# Patient Record
Sex: Male | Born: 1955 | Race: White | Hispanic: No | Marital: Married | Smoking: Never smoker
Health system: Southern US, Community
[De-identification: ages and names within clinical notes are randomized; demographics above are authoritative.]

## PROBLEM LIST (undated history)

## (undated) DIAGNOSIS — C189 Malignant neoplasm of colon, unspecified: Secondary | ICD-10-CM

## (undated) DIAGNOSIS — Z7409 Other reduced mobility: Secondary | ICD-10-CM

## (undated) DIAGNOSIS — G4733 Obstructive sleep apnea (adult) (pediatric): Secondary | ICD-10-CM

## (undated) DIAGNOSIS — G629 Polyneuropathy, unspecified: Secondary | ICD-10-CM

## (undated) DIAGNOSIS — I350 Nonrheumatic aortic (valve) stenosis: Secondary | ICD-10-CM

## (undated) DIAGNOSIS — G5600 Carpal tunnel syndrome, unspecified upper limb: Secondary | ICD-10-CM

## (undated) DIAGNOSIS — E119 Type 2 diabetes mellitus without complications: Secondary | ICD-10-CM

## (undated) DIAGNOSIS — E669 Obesity, unspecified: Secondary | ICD-10-CM

## (undated) DIAGNOSIS — I10 Essential (primary) hypertension: Secondary | ICD-10-CM

## (undated) DIAGNOSIS — Z889 Allergy status to unspecified drugs, medicaments and biological substances status: Secondary | ICD-10-CM

## (undated) HISTORY — PX: FOOT SURGERY: SHX648

## (undated) HISTORY — DX: Obstructive sleep apnea (adult) (pediatric): G47.33

## (undated) HISTORY — DX: Polyneuropathy, unspecified: G62.9

## (undated) HISTORY — DX: Carpal tunnel syndrome, unspecified upper limb: G56.00

## (undated) HISTORY — DX: Obesity, unspecified: E66.9

## (undated) HISTORY — DX: Type 2 diabetes mellitus without complications: E11.9

## (undated) HISTORY — DX: Nonrheumatic aortic (valve) stenosis: I35.0

## (undated) HISTORY — DX: Malignant neoplasm of colon, unspecified: C18.9

---

## 1997-10-24 ENCOUNTER — Ambulatory Visit: Admission: RE | Admit: 1997-10-24 | Discharge: 1997-10-24 | Payer: Self-pay | Admitting: Family Medicine

## 1998-01-30 ENCOUNTER — Ambulatory Visit: Admission: RE | Admit: 1998-01-30 | Discharge: 1998-01-30 | Payer: Self-pay | Admitting: Pulmonary Disease

## 2003-08-03 ENCOUNTER — Encounter
Admission: RE | Admit: 2003-08-03 | Discharge: 2003-10-13 | Payer: Self-pay | Admitting: Physical Medicine & Rehabilitation

## 2003-10-13 ENCOUNTER — Encounter
Admission: RE | Admit: 2003-10-13 | Discharge: 2003-12-30 | Payer: Self-pay | Admitting: Physical Medicine & Rehabilitation

## 2003-10-30 ENCOUNTER — Ambulatory Visit: Payer: Self-pay | Admitting: Physical Medicine & Rehabilitation

## 2003-12-30 ENCOUNTER — Encounter
Admission: RE | Admit: 2003-12-30 | Discharge: 2004-03-29 | Payer: Self-pay | Admitting: Physical Medicine & Rehabilitation

## 2004-03-17 ENCOUNTER — Ambulatory Visit: Payer: Self-pay | Admitting: Physical Medicine & Rehabilitation

## 2004-04-15 ENCOUNTER — Encounter
Admission: RE | Admit: 2004-04-15 | Discharge: 2004-07-14 | Payer: Self-pay | Admitting: Physical Medicine & Rehabilitation

## 2004-05-24 ENCOUNTER — Ambulatory Visit: Payer: Self-pay | Admitting: Physical Medicine & Rehabilitation

## 2004-07-15 ENCOUNTER — Encounter
Admission: RE | Admit: 2004-07-15 | Discharge: 2004-10-13 | Payer: Self-pay | Admitting: Physical Medicine & Rehabilitation

## 2004-08-23 ENCOUNTER — Ambulatory Visit: Payer: Self-pay | Admitting: Physical Medicine & Rehabilitation

## 2004-10-19 ENCOUNTER — Ambulatory Visit: Payer: Self-pay | Admitting: Physical Medicine & Rehabilitation

## 2004-10-19 ENCOUNTER — Encounter
Admission: RE | Admit: 2004-10-19 | Discharge: 2005-01-17 | Payer: Self-pay | Admitting: Physical Medicine & Rehabilitation

## 2004-11-21 ENCOUNTER — Ambulatory Visit: Payer: Self-pay | Admitting: Physical Medicine & Rehabilitation

## 2004-12-26 ENCOUNTER — Ambulatory Visit: Payer: Self-pay | Admitting: Physical Medicine & Rehabilitation

## 2005-02-09 ENCOUNTER — Ambulatory Visit: Payer: Self-pay | Admitting: Physical Medicine & Rehabilitation

## 2005-02-09 ENCOUNTER — Encounter
Admission: RE | Admit: 2005-02-09 | Discharge: 2005-05-10 | Payer: Self-pay | Admitting: Physical Medicine & Rehabilitation

## 2005-04-28 ENCOUNTER — Ambulatory Visit: Payer: Self-pay | Admitting: Physical Medicine & Rehabilitation

## 2005-06-09 ENCOUNTER — Encounter
Admission: RE | Admit: 2005-06-09 | Discharge: 2005-09-07 | Payer: Self-pay | Admitting: Physical Medicine & Rehabilitation

## 2005-06-09 ENCOUNTER — Ambulatory Visit: Payer: Self-pay | Admitting: Physical Medicine & Rehabilitation

## 2005-07-10 ENCOUNTER — Ambulatory Visit: Payer: Self-pay | Admitting: Physical Medicine & Rehabilitation

## 2005-08-08 ENCOUNTER — Ambulatory Visit: Payer: Self-pay | Admitting: Physical Medicine & Rehabilitation

## 2005-10-02 ENCOUNTER — Encounter
Admission: RE | Admit: 2005-10-02 | Discharge: 2005-12-31 | Payer: Self-pay | Admitting: Physical Medicine & Rehabilitation

## 2005-10-03 ENCOUNTER — Ambulatory Visit: Payer: Self-pay | Admitting: Physical Medicine & Rehabilitation

## 2005-11-28 ENCOUNTER — Ambulatory Visit: Payer: Self-pay | Admitting: Physical Medicine & Rehabilitation

## 2006-02-07 ENCOUNTER — Ambulatory Visit: Payer: Self-pay | Admitting: Physical Medicine & Rehabilitation

## 2006-02-07 ENCOUNTER — Encounter
Admission: RE | Admit: 2006-02-07 | Discharge: 2006-05-08 | Payer: Self-pay | Admitting: Physical Medicine & Rehabilitation

## 2006-05-07 ENCOUNTER — Ambulatory Visit: Payer: Self-pay | Admitting: Physical Medicine & Rehabilitation

## 2006-05-07 ENCOUNTER — Encounter
Admission: RE | Admit: 2006-05-07 | Discharge: 2006-08-05 | Payer: Self-pay | Admitting: Physical Medicine & Rehabilitation

## 2006-06-12 ENCOUNTER — Encounter
Admission: RE | Admit: 2006-06-12 | Discharge: 2006-09-10 | Payer: Self-pay | Admitting: Physical Medicine & Rehabilitation

## 2006-07-04 ENCOUNTER — Ambulatory Visit: Payer: Self-pay | Admitting: Physical Medicine & Rehabilitation

## 2006-08-03 ENCOUNTER — Encounter
Admission: RE | Admit: 2006-08-03 | Discharge: 2006-11-01 | Payer: Self-pay | Admitting: Physical Medicine & Rehabilitation

## 2006-09-07 ENCOUNTER — Ambulatory Visit: Payer: Self-pay | Admitting: Physical Medicine & Rehabilitation

## 2006-11-06 ENCOUNTER — Ambulatory Visit: Payer: Self-pay | Admitting: Physical Medicine & Rehabilitation

## 2006-11-06 ENCOUNTER — Encounter
Admission: RE | Admit: 2006-11-06 | Discharge: 2007-02-04 | Payer: Self-pay | Admitting: Physical Medicine & Rehabilitation

## 2006-12-28 ENCOUNTER — Ambulatory Visit: Payer: Self-pay | Admitting: Physical Medicine & Rehabilitation

## 2007-02-01 ENCOUNTER — Encounter
Admission: RE | Admit: 2007-02-01 | Discharge: 2007-05-02 | Payer: Self-pay | Admitting: Physical Medicine & Rehabilitation

## 2007-02-01 ENCOUNTER — Ambulatory Visit: Payer: Self-pay | Admitting: Physical Medicine & Rehabilitation

## 2007-02-27 ENCOUNTER — Ambulatory Visit: Payer: Self-pay | Admitting: Physical Medicine & Rehabilitation

## 2007-05-02 ENCOUNTER — Encounter
Admission: RE | Admit: 2007-05-02 | Discharge: 2007-07-31 | Payer: Self-pay | Admitting: Physical Medicine & Rehabilitation

## 2007-05-07 ENCOUNTER — Ambulatory Visit: Payer: Self-pay | Admitting: Physical Medicine & Rehabilitation

## 2007-06-04 ENCOUNTER — Ambulatory Visit: Payer: Self-pay | Admitting: Physical Medicine & Rehabilitation

## 2007-07-03 ENCOUNTER — Ambulatory Visit: Payer: Self-pay | Admitting: Physical Medicine & Rehabilitation

## 2007-08-02 ENCOUNTER — Encounter
Admission: RE | Admit: 2007-08-02 | Discharge: 2007-10-02 | Payer: Self-pay | Admitting: Physical Medicine & Rehabilitation

## 2007-08-05 ENCOUNTER — Ambulatory Visit: Payer: Self-pay | Admitting: Physical Medicine & Rehabilitation

## 2007-09-03 ENCOUNTER — Ambulatory Visit: Payer: Self-pay | Admitting: Physical Medicine & Rehabilitation

## 2007-10-02 ENCOUNTER — Ambulatory Visit: Payer: Self-pay | Admitting: Physical Medicine & Rehabilitation

## 2007-10-31 ENCOUNTER — Encounter
Admission: RE | Admit: 2007-10-31 | Discharge: 2008-01-29 | Payer: Self-pay | Admitting: Physical Medicine & Rehabilitation

## 2007-11-01 ENCOUNTER — Ambulatory Visit: Payer: Self-pay | Admitting: Physical Medicine & Rehabilitation

## 2007-11-29 ENCOUNTER — Ambulatory Visit: Payer: Self-pay | Admitting: Physical Medicine & Rehabilitation

## 2007-12-27 ENCOUNTER — Ambulatory Visit: Payer: Self-pay | Admitting: Physical Medicine & Rehabilitation

## 2008-01-01 ENCOUNTER — Ambulatory Visit (HOSPITAL_COMMUNITY): Admission: RE | Admit: 2008-01-01 | Discharge: 2008-01-01 | Payer: Self-pay | Admitting: Gastroenterology

## 2008-01-01 ENCOUNTER — Encounter (INDEPENDENT_AMBULATORY_CARE_PROVIDER_SITE_OTHER): Payer: Self-pay | Admitting: Gastroenterology

## 2008-01-24 ENCOUNTER — Encounter
Admission: RE | Admit: 2008-01-24 | Discharge: 2008-01-27 | Payer: Self-pay | Admitting: Physical Medicine & Rehabilitation

## 2008-01-27 ENCOUNTER — Ambulatory Visit: Payer: Self-pay | Admitting: Physical Medicine & Rehabilitation

## 2008-03-16 DIAGNOSIS — C189 Malignant neoplasm of colon, unspecified: Secondary | ICD-10-CM

## 2008-03-16 HISTORY — PX: COLON SURGERY: SHX602

## 2008-03-16 HISTORY — DX: Malignant neoplasm of colon, unspecified: C18.9

## 2008-04-16 ENCOUNTER — Encounter
Admission: RE | Admit: 2008-04-16 | Discharge: 2008-04-16 | Payer: Self-pay | Admitting: Physical Medicine & Rehabilitation

## 2008-07-01 ENCOUNTER — Encounter
Admission: RE | Admit: 2008-07-01 | Discharge: 2008-09-29 | Payer: Self-pay | Admitting: Physical Medicine & Rehabilitation

## 2008-07-01 ENCOUNTER — Ambulatory Visit: Payer: Self-pay | Admitting: Physical Medicine & Rehabilitation

## 2008-07-31 ENCOUNTER — Ambulatory Visit: Payer: Self-pay | Admitting: Physical Medicine & Rehabilitation

## 2008-08-26 ENCOUNTER — Ambulatory Visit: Payer: Self-pay | Admitting: Physical Medicine & Rehabilitation

## 2008-09-23 ENCOUNTER — Encounter
Admission: RE | Admit: 2008-09-23 | Discharge: 2008-12-22 | Payer: Self-pay | Admitting: Physical Medicine & Rehabilitation

## 2008-09-25 ENCOUNTER — Ambulatory Visit: Payer: Self-pay | Admitting: Physical Medicine & Rehabilitation

## 2008-10-23 ENCOUNTER — Ambulatory Visit: Payer: Self-pay | Admitting: Physical Medicine & Rehabilitation

## 2008-11-20 ENCOUNTER — Ambulatory Visit: Payer: Self-pay | Admitting: Physical Medicine & Rehabilitation

## 2008-12-15 ENCOUNTER — Ambulatory Visit: Payer: Self-pay | Admitting: Physical Medicine & Rehabilitation

## 2009-01-18 ENCOUNTER — Encounter
Admission: RE | Admit: 2009-01-18 | Discharge: 2009-04-18 | Payer: Self-pay | Admitting: Physical Medicine & Rehabilitation

## 2009-01-19 ENCOUNTER — Ambulatory Visit: Payer: Self-pay | Admitting: Physical Medicine & Rehabilitation

## 2009-02-18 ENCOUNTER — Ambulatory Visit: Payer: Self-pay | Admitting: Physical Medicine & Rehabilitation

## 2009-03-19 ENCOUNTER — Ambulatory Visit: Payer: Self-pay | Admitting: Physical Medicine & Rehabilitation

## 2009-04-15 ENCOUNTER — Ambulatory Visit: Payer: Self-pay | Admitting: Physical Medicine & Rehabilitation

## 2009-04-22 ENCOUNTER — Encounter: Payer: Self-pay | Admitting: Pulmonary Disease

## 2009-05-06 ENCOUNTER — Encounter
Admission: RE | Admit: 2009-05-06 | Discharge: 2009-08-04 | Payer: Self-pay | Admitting: Physical Medicine & Rehabilitation

## 2009-05-13 ENCOUNTER — Ambulatory Visit: Payer: Self-pay | Admitting: Physical Medicine & Rehabilitation

## 2009-06-18 ENCOUNTER — Ambulatory Visit: Payer: Self-pay | Admitting: Physical Medicine & Rehabilitation

## 2009-07-08 ENCOUNTER — Ambulatory Visit (HOSPITAL_COMMUNITY): Admission: RE | Admit: 2009-07-08 | Discharge: 2009-07-08 | Payer: Self-pay | Admitting: Gastroenterology

## 2009-07-15 ENCOUNTER — Ambulatory Visit: Payer: Self-pay | Admitting: Physical Medicine & Rehabilitation

## 2009-08-20 ENCOUNTER — Encounter
Admission: RE | Admit: 2009-08-20 | Discharge: 2009-11-18 | Payer: Self-pay | Admitting: Physical Medicine & Rehabilitation

## 2009-08-26 ENCOUNTER — Ambulatory Visit: Payer: Self-pay | Admitting: Physical Medicine & Rehabilitation

## 2009-10-01 ENCOUNTER — Ambulatory Visit: Payer: Self-pay | Admitting: Physical Medicine & Rehabilitation

## 2009-11-19 ENCOUNTER — Encounter
Admission: RE | Admit: 2009-11-19 | Discharge: 2010-01-12 | Payer: Self-pay | Source: Home / Self Care | Attending: Physical Medicine & Rehabilitation | Admitting: Physical Medicine & Rehabilitation

## 2009-11-25 ENCOUNTER — Ambulatory Visit: Payer: Self-pay | Admitting: Physical Medicine & Rehabilitation

## 2010-01-17 ENCOUNTER — Encounter
Admission: RE | Admit: 2010-01-17 | Discharge: 2010-02-15 | Payer: Self-pay | Source: Home / Self Care | Attending: Physical Medicine & Rehabilitation | Admitting: Physical Medicine & Rehabilitation

## 2010-01-19 ENCOUNTER — Ambulatory Visit
Admission: RE | Admit: 2010-01-19 | Discharge: 2010-01-19 | Payer: Self-pay | Source: Home / Self Care | Attending: Physical Medicine & Rehabilitation | Admitting: Physical Medicine & Rehabilitation

## 2010-02-17 NOTE — Letter (Signed)
Summary: CMN request for CPAP Supplies/[facility]  CMN request for CPAP Supplies/[facility]   Imported By: Sherian Rein 05/17/2009 15:08:24  _____________________________________________________________________  External Attachment:    Type:   Image     Comment:   External Document

## 2010-03-24 ENCOUNTER — Encounter: Payer: Medicare FFS | Attending: Physical Medicine & Rehabilitation

## 2010-03-24 ENCOUNTER — Ambulatory Visit (HOSPITAL_BASED_OUTPATIENT_CLINIC_OR_DEPARTMENT_OTHER): Payer: Medicare FFS

## 2010-03-24 DIAGNOSIS — M47817 Spondylosis without myelopathy or radiculopathy, lumbosacral region: Secondary | ICD-10-CM

## 2010-03-24 DIAGNOSIS — M1710 Unilateral primary osteoarthritis, unspecified knee: Secondary | ICD-10-CM | POA: Insufficient documentation

## 2010-03-24 DIAGNOSIS — M545 Low back pain, unspecified: Secondary | ICD-10-CM | POA: Insufficient documentation

## 2010-03-24 DIAGNOSIS — M25569 Pain in unspecified knee: Secondary | ICD-10-CM | POA: Insufficient documentation

## 2010-03-24 DIAGNOSIS — F3289 Other specified depressive episodes: Secondary | ICD-10-CM | POA: Insufficient documentation

## 2010-03-24 DIAGNOSIS — R29898 Other symptoms and signs involving the musculoskeletal system: Secondary | ICD-10-CM | POA: Insufficient documentation

## 2010-03-24 DIAGNOSIS — F329 Major depressive disorder, single episode, unspecified: Secondary | ICD-10-CM | POA: Insufficient documentation

## 2010-03-24 DIAGNOSIS — R209 Unspecified disturbances of skin sensation: Secondary | ICD-10-CM | POA: Insufficient documentation

## 2010-03-24 DIAGNOSIS — E669 Obesity, unspecified: Secondary | ICD-10-CM

## 2010-03-24 DIAGNOSIS — K5900 Constipation, unspecified: Secondary | ICD-10-CM | POA: Insufficient documentation

## 2010-05-24 ENCOUNTER — Encounter: Payer: Medicare FFS | Attending: Physical Medicine & Rehabilitation | Admitting: Physical Medicine & Rehabilitation

## 2010-05-24 DIAGNOSIS — F329 Major depressive disorder, single episode, unspecified: Secondary | ICD-10-CM

## 2010-05-24 DIAGNOSIS — E669 Obesity, unspecified: Secondary | ICD-10-CM

## 2010-05-24 DIAGNOSIS — M545 Low back pain, unspecified: Secondary | ICD-10-CM | POA: Insufficient documentation

## 2010-05-24 DIAGNOSIS — M1710 Unilateral primary osteoarthritis, unspecified knee: Secondary | ICD-10-CM

## 2010-05-24 DIAGNOSIS — F3289 Other specified depressive episodes: Secondary | ICD-10-CM | POA: Insufficient documentation

## 2010-05-24 DIAGNOSIS — M47817 Spondylosis without myelopathy or radiculopathy, lumbosacral region: Secondary | ICD-10-CM

## 2010-05-24 DIAGNOSIS — M25569 Pain in unspecified knee: Secondary | ICD-10-CM | POA: Insufficient documentation

## 2010-05-25 NOTE — Assessment & Plan Note (Signed)
Brian Richmond is back regarding his bilateral knee and low back pain.  He complains of worsening pain in the knees over the last several months. We tried injections for his knees in the past and had some initial success but the more recent injections failed to give him any relief. He is having more pain when he bears weight transfers.  Pain is usually at the knees and radiating into the shins.  He has some low back pain still as well but does not notice much of a problem there.  He reports his sleep is fair at this point.  REVIEW OF SYSTEMS:  Notable for the above.  He is having some issues with his bladder control and Vesicare he was started on recently has helped that.  His mood is much improved.  SOCIAL HISTORY:  Unchanged.  Living with his wife and foster children.  PHYSICAL EXAMINATION:  VITAL SIGNS:  Blood pressure is 149/59, pulse 66, respiratory rate is 16, and he is saturating 98% on room air. GENERAL:  The patient remains morbidly obese and is IN his power wheelchair today. MUSCULOSKELETAL:  He has crepitus in both knees with range of motion passively.  He had pain with valgus and varus stretches as well as rotation at the knees. HEART:  Regular. CHEST:  Clear. ABDOMEN:  Soft and nontender.  ASSESSMENT: 1. Chronic low back pain. 2. Osteoarthritis of the knees. 3. Morbid obesity. 4. Depression.  PLAN: 1. Continue with Celexa which was refilled today. 2. We will increase his MS Contin to 45 mg q.12 h ---- #180 of the 15     mg tablets were written. 3. I will continue with his hydrocodone for breakthrough pain at     previous dosing (7.5/500 one q.8 h p.r.n. #90). 4. The patient did mention to me today also that he was diagnosed with     carpal tunnel syndrome as well as peripheral neuropathy.  It sounds     as if his carpal tunnel syndrome is severe and he will need surgery     on his wrists. 5. My nurse practitioner will see the patient back in about 1 month's      time.     Ranelle Oyster, M.D. Electronically Signed    ZTS/MedQ D:  05/24/2010 13:17:50  T:  05/25/2010 01:09:38  Job #:  [id number]

## 2010-05-31 NOTE — Assessment & Plan Note (Signed)
HISTORY:  Brian Richmond is back regarding his chronic back and knee pain.  He  notes no real changes with his Avinza and Vicodin today.  He rates his  pain at a 5-6/10.  He comes in with his power chair today, stating that  he finally got a lift attached to his car so that he can now use the  chair and take it with him more frequently.  He states that pain  interferes with his general activity, his relation with others and  normally at a moderate to severe level.  The pain is usually worse with  walking and standing.  He has tried the Hytrin we prescribed in January  and it has helped with his bladder, but he began to have worsening  constipation, so he came off of it.  He is interested in trying  something different.   REVIEW OF SYSTEMS:  Notable for the above.  A full review is in the  written health and history section of the chart.   SOCIAL HISTORY:  The patient is married.  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   PHYSICAL EXAMINATION:  VITAL SIGNS:  Blood pressure 165/76, pulse 80,  respirations 24, saturation 98% on room air.  Weight is unchanged.  GENERAL:  The patient is pleasant, alert and oriented.  He sits in his  wheelchair comfortably.  I did not have him stand or range his back  today per se.  EXTREMITIES/NEUROLOGIC:  His knee has remained painful with resistive  flexion and extension.  His strength is 5/5.  Sensory exam is normal.  CHEST:  Clear.  ABDOMEN:  Soft, nontender.   ASSESSMENT:  1. Multifactorial low back pain.  2. Bilateral osteoarthritis of the knees.  3. Morbid obesity.  4. Urinary hesitancy.   PLAN:  1. Refilled Avinza and Vicodin today.  2. Flomax drops, 0.4 mg q.h.s.  3. I will see him back in one month.  4. Nurse Clinic followup in three months' time.      Ranelle Oyster, M.D.  Electronically Signed     ZTS/MedQ  D:  05/07/2007 11:29:20  T:  05/07/2007 12:06:51  Job #:  [id number]

## 2010-05-31 NOTE — Assessment & Plan Note (Signed)
Brian Richmond is back regarding his chronic back and knee pain.  Not a lot  change since I last saw him.  He is stating that he is using occasional  Tylenol [id number] mg in addition to his Vicodin for his breakthrough  pain.  He had not lost any weight.  He uses a power chair for a longer  distances.  He can minimally move without the walker.  He states that he  can walk about a minute without having to stop.  Flomax has not helped  his urinary hesitancy significantly.  He rates his pain as 6/10;  describes as burning and aching.  The pain interferes with his general  activity, relations with others, enjoyment of life from moderate to  severe level.   REVIEW OF SYSTEMS:  Notable for some numbness as well as constipation  which is controlled with his regular Dulcolax.  Other pertinent  positives above and full 14-point review is in the written health and  history section.   SOCIAL HISTORY:  No significant change.   PHYSICAL EXAMINATION:  Blood pressure 135/61, pulse is 73, respiratory  rate 18, sating 96% on room air.  The patient remains obese.  He has  limited lumbar movement.  Knees are painful with the resisted flexion-  extension.  He has hard time weightbearing.  His sensory exam is really  without any focal findings today.  Chest is clear.  Abdomen, soft,  nontender.   ASSESSMENT:  1. Multifactorial low back pain.  2. Bilateral osteoarthritis of the knees.  3. Morbid obesity.  4. Urinary hesitancy.   PLAN:  1. Refill of Avinza 60 mg daily and Vicodin 7.5 one q.8 hours p.r.n.  2. Recommend urological followup, as bladder is still a problem.  3. The patient sees me in 3 months and Nurse Clinic in 1 month.      Ranelle Oyster, M.D.  Electronically Signed     ZTS/MedQ  D:  08/05/2007 13:11:41  T:  08/06/2007 16:10:96  Job #:  [id number]

## 2010-05-31 NOTE — Assessment & Plan Note (Signed)
Brian Richmond is back regarding his chronic knee pain.  He has had a lot of  change with his knees since I last saw him.  He is doing all right with  the change over to MS Contin in place of Avinza.  His biggest news is  recent workup, which has revealed some colon cancer.  He is in need of  resection/partial colectomy apparently; however, Anesthesia by his  account is hesitant to put him under for the surgery.  Apparently, he is  going to [facility] for an opinion regarding surgery there.  He is keeping  up with his bowel somewhat on his regimen of Dulcolax tabs and diet;  however, he needs to be a bit more consistent with that overall.   The patient rates his pain a 6-7/10 and describes it interfering with  his general activity, relations with others, enjoyment of life on a  severe level.   REVIEW OF SYSTEMS:  Notable for numbness, constipation, occasional  problems with appetite.  Full review is in the written health and  history section.  Other pertinent positives are above.   SOCIAL HISTORY:  The patient is married and doing well from a home  standpoint.   PHYSICAL EXAMINATION:  VITAL SIGNS:  Blood pressure is 145/52, pulse 78,  respiratory rate 20, sating 97% on room air.  GENERAL:  The patient is pleasant, alert and oriented x3.  He is in his  power wheelchair again today.  No new changes are noted in the knees.  His motor and sensory exam are unchanged.  HEART:  Regular.  CHEST:  Clear.  ABDOMEN:  Soft and nontender.  He remains morbidly overweight.   ASSESSMENT:  1. Multifactorial low back pain.  2. Osteoarthritis, bilateral knees.  3. Morbid obesity.  4. Apparent colon cancer.   PLAN:  1. Continue with MS Contin 30 mg q.12 h.  Stressed again the      importance of regular bowel regimen, and we discussed the options      today.  2. Vicodin 7.5/750 one q.8 h. p.r.n. #90.  3. I will see the patient back in 3 months and 1 month with nursing.      Ranelle Oyster, M.D.  Electronically Signed     ZTS/MedQ  D:  01/27/2008 17:15:34  T:  01/28/2008 07:27:57  Job #:  [id number]

## 2010-05-31 NOTE — Assessment & Plan Note (Signed)
Brian Richmond is back regarding his chronic back and knee pain.  Pain has been  about the same as it has been in intensity level.  He does rate it 6/10  today, which is lower than his previous 8/10 estimation in April.  He  described it as constant, aching, and at times burning.  He has been  active with the summer camp once again and I think that helps distract  him from his pain.  He has been working on his diet and eating better.  He states that at his last check 3 weeks ago at the nutrition center, he  lost 9 pounds.  He is excited about his progress.  He has cut out  sugared drinks and is eating more vegetables.   On review of systems, the patient reports occasional bladder control  problems and constipation.  Other pertinent positives are listed above  and a full review is in the health and history section of the chart.   SOCIAL HISTORY:  He is living with his wife and has two foster children.  He also helps take care of his grandchildren.   PHYSICAL EXAMINATION:  Blood pressure is 160/74, pulse is 96,  respiratory rate 18.  He is saturating 94% on room air.  The patient remains obese.  He does not appear to have lost noticeable  weight.  His ambulation appears to be a bit improved with better  transfers and weight shift.  He stands with a flexed posture and is  dependent upon the walker for balance.  Strength is generally 5/5.  Sensory exam is intact.  Heart is regular.  Chest is clear.   ASSESSMENT:  1. Multifactorial low back pain.  2. Bilateral osteoarthritis of the knees.  3. Morbid obesity.   PLAN:  1. I am proud that Brian Richmond seems to be somewhat committed now to his      diet and weight loss attempts.  Hopefully, he is successful and can      stick with these measures for some time.  2. Refilled Avinza and Vicodin today.  3. Gave patient samples of Celebrex.  He will seek assistance through      the drug assistance program.  4. I will see patient back in 3 months' time with  nurse clinic follow-      up in 1 month.      Ranelle Oyster, M.D.  Electronically Signed     ZTS/MedQ  D:  08/06/2006 12:28:46  T:  08/06/2006 13:46:16  Job #:  [id number]

## 2010-05-31 NOTE — Assessment & Plan Note (Signed)
REASON FOR VISIT:  Brian Richmond is back regarding his chronic knee and back  pain.  Not much has changed since I last saw him.  He states that he has  lost about 10 pounds.  He has tried to work on his diet, cut out soft  drinks and some other things, but has not made any dramatic changes.  He  has been a bit busier with family life and home management and states  that occasionally increases his pain.  His pain right now is a 7/10.  The pain is most prominent in his back and knees.  He uses his walker  for ambulation and balance.   REVIEW OF SYSTEMS:  The patient reports occasional bladder control  problem and drip problems with dribbling and sometimes difficulty with  flow when he does go to the bathroom.  The other issues are noted above.  Full reviews are in the written health and history section of the chart.   SOCIAL HISTORY:  The patient is married and continues to help out with  family management and home keeping.   PHYSICAL EXAMINATION:  VITAL SIGNS:  Blood pressure 165/75, pulse 77,  respirations 18, saturations 99% on room air.  GENERAL:  The patient is pleasant, alert and oriented x3.  He remains  morbidly obese.  He states his weight is approximately 500 pounds.  He  stands with a flexed posture and still has difficulty standing fully  erect.  He uses his walker for unloading.  Strength is generally 5/5.  Sensory exam is normal.  HEART:  Regular.  CHEST:  Clear.  NEUROLOGIC:  Cognitively, he is appropriate.   ASSESSMENT:  1. Multifactorial low back pain.  2. Bilateral osteoarthritis of the knees.  3. Morbid obesity.  4. Occasional urinary incontinence and decrease flow.   PLAN:  1. Refilled Avinza and Vicodin today, #30 and #90 respectively.  2. Recommended some over-the-counter remedies to supplement ibuprofen      and replace ibuprofen including Naproxen, Omega-3 fatty acids and      glucosamine.  3. He really needs to continue working on diet and exercise as  possible.  4. Discussed potential use of Flomax for urinary issues.  Certainly,      his weight is a factor here and I think the fact that he waits to      go to the bathroom because of leg pain, obviously is another reason      he needs to make an effort to go more regularly.  5. I will see him back in 3 months with nurse clinic in 1 month.      Ranelle Oyster, M.D.  Electronically Signed     ZTS/MedQ  D:  11/07/2006 11:59:08  T:  11/08/2006 08:11:21  Job #:  [id number]

## 2010-05-31 NOTE — Assessment & Plan Note (Signed)
HISTORY OF PRESENT ILLNESS:  Brian Richmond is back regarding his chronic knee  and back pain.  He generally remains stable on his current regimen of  Avinza and Vicodin.  He rates his pain as 6 out 10 today.  He has not  had much success with weight loss efforts.  He has not made any  significant changes in his diet.  He has more pain usually when he tries  to exercise and, thus, ultimately inhibits his efforts for aerobic  activity.   REVIEW OF SYSTEMS:  The patient reports constipation.  He has occasional  flow problems with his bladder with some spasm, perhaps, after he voids  as well.  Other pertinent positives are listed above on full review and  the written history section.   SOCIAL HISTORY:  The patient remains married and is a foster parent.   PHYSICAL EXAMINATION:  VITAL SIGNS:  Blood pressure is 154/77.  Pulse is  77.  Respiratory rate 18.  He is satting 96% on room air.  GENERAL APPEARANCE:  The patient is pleasant, alert and oriented x3.  He  remains morbidly obese.  He continues the exam with a flexed posture and  uses his walker to unload his weight somewhat.  Strength is generally is  5/5.  Sensory exam is normal in both lower extremities.  HEART:  Regular.  CHEST:  Clear.  ABDOMEN:  Soft and nontender.   ASSESSMENT:  1. Multifactorial low back pain.  2. Bilateral osteoarthritis of the knees.  3. Morbid obesity.  4. Occasional urinary incontinence and decreased flow.   PLAN:  1. Refill the Avinza and Vicodin today.  The patient is wanting three      month prescriptions, which we can help him with.  2. Try Hytrin 1 mg nightly for flow and blood pressure.  A      prescription was written for 1 mg nightly.  3. We will see him back in three months with a nurse clinic followup      in one month's time.      Ranelle Oyster, M.D.  Electronically Signed     ZTS/MedQ  D:  02/04/2007 11:19:24  T:  02/04/2007 11:54:25  Job #:  [id number]

## 2010-05-31 NOTE — Assessment & Plan Note (Signed)
Brian Richmond is back regarding chronic back and knee pain.  Knee pain is  generally at its baseline of 5-6/10.  He did have his colon resection in  March and is recovering.  He had some wound complications and still is  receiving wound care for delayed closure.  Pain interferes with general  activity, relations with others, enjoyment of life on a moderate-to-  severe level still.   REVIEW OF SYSTEMS:  Notable for bladder control problems, numbness,  urine retention, and constipation.  Now on MiraLax for his bowels.  Still has issues with constipation, however.  Full 14 point review is in  written health and history section.  Other pertinent positives are  above.   SOCIAL HISTORY:  The patient is married and living alone.  He is active  in summer camps once again.   PHYSICAL EXAMINATION:  VITAL SIGNS:  Blood pressure is 134/58, pulse is  73, respiratory rate 18, and he is sating 96% on room air.  NEUROLOGIC:  The patient is pleasant, alert, and oriented x3.  He is in  his power wheelchair.  I did not examine his abdominal wound.  Knees are  unchanged.  Back was difficult to assess today.  Motor and sensory exam  are stable.  HEART:  Regular.  CHEST:  Clear.  ABDOMEN:  Soft and nontender.  Remains morbidly obese.   ASSESSMENT:  1. Multifactorial low back pain.  2. Osteoarthritis of the knees.  3. Morbid obesity.  4. Colon cancer, status post resection with excellent prognosis for      recovery as lymph nodes were negative.   PLAN:  1. We will continue MS Contin 30 mg q.12 h., #60 and Vicodin 7.5, #90,      one q.8 h. p.r.n.  2. I discussed adding essentially fiber to his diet and adding fiber      supplements in addition to his MiraLax to improve his bowel      regimen.  Certainly since he has had been colon surgery, he will      need to be more aggressive than ever with his bowel regimen.  3. We will see him back in 3 months with me and 1 month with nursing.      Ranelle Oyster, M.D.  Electronically Signed     ZTS/MedQ  D:  07/01/2008 09:57:50  T:  07/01/2008 23:52:44  Job #:  [id number]

## 2010-05-31 NOTE — Op Note (Signed)
NAME:  Brian Richmond, Brian Richmond                ACCOUNT NO.:  [id number]   MEDICAL RECORD NO.:  [id number]          PATIENT TYPE:  AMB   LOCATION:  ENDO                         FACILITY:  [facility]   PHYSICIAN:  Danise Edge, M.D.   DATE OF BIRTH:  December 20, 1955   DATE OF PROCEDURE:  01/01/2008  DATE OF DISCHARGE:                               OPERATIVE REPORT   REFERRING PHYSICIAN:  Caryn Bee L. Little, MD   PROCEDURE:  Colonoscopy.   PROCEDURE INDICATIONS:  Brian Richmond is a 55 year old male, born  on [birth year].  Brian Richmond is undergoing his first screening  colonoscopy with polypectomy to prevent colon cancer.  When he develops  constipation, he passes fresh blood with his constipated bowel  movements.   MEDICATION ALLERGIES:  None.   CHRONIC MEDICATIONS:  Avinza, Vicodin, Aleve, Sudafed, and Benadryl.   PAST MEDICAL AND SURGICAL HISTORY:  1. Arthritis.  2. Carpal tunnel syndrome.  3. Obstructive sleep apnea.  4. Obesity.  5. Seasonal allergies.   HABITS:  Brian Richmond does not smoke cigarettes or consume alcohol.   ENDOSCOPIST:  Danise Edge, MD   PREMEDICATION:  Versed 10 mg and fentanyl 100 mcg.   DESCRIPTION OF PROCEDURE:  After obtaining informed consent, Brian Richmond  was placed in the left lateral decubitus position.  I administered  intravenous fentanyl and intravenous Versed to achieve conscious  sedation for the procedure.  The patient's blood pressure, oxygen  saturation, and cardiac rhythm were monitored throughout the procedure  and documented in the medical record.   Anal inspection was normal.  The Pentax pediatric colonoscope was  introduced into the rectum and easily advanced to the cecum.  A normal-  appearing ileocecal valve and appendiceal orifice were identified.  Colonic preparation for the exam today was satisfactory.   Rectum normal.  Retroflexed view of the distal rectum normal.   Sigmoid colon and descending colon.  At approximately 40 cm from  the  anal verge, there is a flat cauliflower appearing 3-cm polypoid lesion,  which most likely represents an adenocarcinoma approximately 3-4 cm in  diameter.  The lesion could only be visualized tangentially making  biopsies difficult.  Biopsies were performed.  The lesion was tattooed  with [place] ink to make surgical identification easier.  At approximately  45 cm from the anal verge, a 4-mm sessile polyp was removed with a cold  snare.   Splenic flexure normal.   Transverse colon normal.   Hepatic flexure normal.   Ascending colon normal.   ASSESSMENT:  1. At 40 cm from the anal verge, there is a 3-cm cauliflower-appearing      tumor, which was biopsied.  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  The area was tattooed.  2. At 45 cm from the anal verge, a 3- to 4-mm sessile polyp was      removed with a cold snare.   RECOMMENDATIONS:  Brian Richmond will require surgical removal of a sigmoid  colon tumor at 40 cm from the anal verge.  ______________________________  Danise Edge, M.D.     MJ/MEDQ  D:  01/01/2008  T:  01/01/2008  Job:  [id number]   cc:   Caryn Bee L. Little, M.D.

## 2010-05-31 NOTE — Assessment & Plan Note (Signed)
Brian Richmond is back regarding his chronic back and knee pain.  Pain has been  generally stable except with some associated diarrhea he has had over  the last 1 or 2 weeks.  He notes that he had a viral flu-like illness  likely contracted from his wife.  He notes that when he had diarrhea,  his pain levels were higher.  Overall, the Avinza and Vicodin are  working for him.  He has made much change in his weight or health  hygiene.   The patient rates his pain as 6-7/10, described as constant and aching.  Pain interferes with general activity, relations with others, and  enjoyment of life on a severe level.   REVIEW OF SYSTEMS:  Notable for the above.  Full review is in the  written health and history section of the chart.   SOCIAL HISTORY:  Without change.  He has 4 new foster children  currently.   PHYSICAL EXAMINATION:  VITAL SIGNS:  Blood pressure is 143/61, the pulse  is 89, respiratory rate 20, and sating 97% on room air.  GENERAL:  The patient is pleasant, alert, and oriented x3.  He is in his  power wheelchair today.  He is obese.  NEUROLOGIC:  Motor and sensory exam are unchanged.  He has pain with  knee extension and flexion.  HEART:  Regular.  CHEST:  Clear.  ABDOMEN:  Soft and nontender.   ASSESSMENT:  1. Multifactorial low back pain.  2. Osteoarthritis of bilateral knees.  3. Morbid obesity.   PLAN:  1. We refilled his Avinza and Vicodin today.  2. We recommended fiber for more consistency with his bowels.  If he      has persistent diarrhea, he needs to see his family doctor.  3. He expressed to me concern over hitting the doughnut hole of his      Medicare program.  He asked if there are cheaper alternatives to      Avinza and we mentioned MS Contin which we could try potentially      may be for financial reasons at 30 mg q.12 h. dose.  4. We will see him back in 1 month with nursing and 3 months with me.      Ranelle Oyster, M.D.  Electronically  Signed     ZTS/MedQ  D:  11/01/2007 12:39:10  T:  11/02/2007 01:38:26  Job #:  [id number]

## 2010-06-03 NOTE — Procedures (Signed)
NAME:  Brian Richmond, Brian Richmond                ACCOUNT NO.:  [id number]   MEDICAL RECORD NO.:  [id number]          PATIENT TYPE:  REC   LOCATION:  [facility]                          FACILITY:  [facility]   PHYSICIAN:  Ranelle Oyster, M.D.DATE OF BIRTH:  February 04, 1955   DATE OF PROCEDURE:  07/05/2005  DATE OF DISCHARGE:                                 OPERATIVE REPORT   PROCEDURE:  Synvisc injection.   DIAGNOSIS:  Osteoarthritis bilateral knees. ICD-9 code 715.96.   DESCRIPTION OF PROCEDURE:  After informed consent and sterile preparation of  the knees with Betadine, we aspirated and injected both knees using 2 mL of  Synvisc solution.  A 21-gauge two-inch needle was utilized.  Patient  experienced no complications.  Areas were cleaned and dressed after  injection today.  This was the second of three injections.  I will see him  back for the third in approximately 10 days' time.      Ranelle Oyster, M.D.  Electronically Signed     ZTS/MEDQ  D:  07/05/2005 09:45:29  T:  07/05/2005 11:21:21  Job:  [id number]

## 2010-06-03 NOTE — Assessment & Plan Note (Signed)
MEDICAL RECORD NUMBER:  Is #[id number].   Brian Richmond is back regarding his low back and knee pain.  We switched him to  Avinza which he is taking 30 mg q.12h.  He likes this very much.  He had to  adjust his medications for constipation and currently he is on a regular  stool softener and laxative.  With the Avinza 30 mg q.12h., he is needing no  Vicodin.  The only time he used Vicodin is when he tapered down briefly to  adjust his bowel meds.  He has been more active in the home.  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  [facility].  He has been hesitant from a psychosocial standpoint.  He  would like to get his wife motivated to go with him at which point he would  be willing to go.  He states that she is close.   REVIEW OF SYSTEMS:  The patient reports no new changes today.  A 14-point  review of systems is in the health and history section of the chart.   SOCIAL HISTORY:  Unchanged today.   PHYSICAL EXAMINATION:  VITAL SIGNS:  Blood pressure is 139/61, pulse is 78,  respiratory rate is 20, sating 93% on room air.  GENERAL:  The patient remains morbidly obese.  He walks more fluidly today,  less pain noted.  Affect is bright.  Appearance is well kept.  MUSCULOSKELETAL:  Motor strength is near 4+ to 5 out of 5 throughout.  Gait  remains wide based.  Coordination is fair.  HEART:  Regular rate and rhythm.  LUNGS:  Clear.  EXTREMITIES:  He continues to have 1+ edema in the bilateral lower  extremities.  He has chronic vascular changes noted there.   ASSESSMENT:  1.  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  2.  Bilateral osteoarthritis of the knees.   PLAN:  1.  Continue Avinza 30 mg q.12h.  I refilled #60 today.  2.  He may use his Vicodin 7.5 mg for breakthrough pain, although he is      using this rarely at this point.  3.  Taper down Celebrex to off  and use on a every day p.r.n. basis 200 mg.  4.  Continue CPAP.  5.  I continue to encourage exercise and aerobic activity.  I think [facility]      [facility].  6.  We talked about glucosamine/chondroitin again today.  7.  We will see the patient back in three months' time.  8.  He will see our nursing followup clinic in one month.       ZTS/MedQ  D:  05/24/2004 16:26:37  T:  05/24/2004 19:23:23  Job #:  [id number]   cc:   Caryn Bee L. Little, M.D.  [address]  [place]  [place] [address]  Fax: [phone]

## 2010-06-03 NOTE — Assessment & Plan Note (Signed)
Brian Richmond is back regarding his chronic back pain.  Pain remains above 5-  6/10.  He has experienced no substantial changes.  He is working on his  bowels as he has had a bit more constipation since his surgery.  He uses  Dulcolax and other softeners as well.  Pain interferes with general  activity, relations with others, enjoyment of life on a severe level.  Sleep is fair.  He is satisfied with where he is at this point with his  pain management.   REVIEW OF SYSTEMS:  Notable for the above as well as some sleep apnea,  occasional numbness.  Full 14-point review is in the written health and  history section of the chart.   SOCIAL HISTORY:  The patient is married and his wife is currently away  on a mission trip in [place].  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  I wrote him a letter last  month in support of his cognitive abilities to do such.   PHYSICAL EXAMINATION:  VITAL SIGNS:  Blood pressure 146/57, pulse 72,  respiratory rate 18.  He is sating 97% on room air.  GENERAL:  The patient is pleasant, alert and oriented x3.  MUSCULOSKELETAL:  The patient continues to have pain with range of  motion of his knee.  Back is unchanged.  Motor and sensory exam are at baseline normal.  HEART:  Regular.  CHEST:  Clear.  ABDOMEN:  Soft, nontender.  He remains morbidly overweight.   ASSESSMENT:  1. Multifactorial low back pain.  2. Osteoarthritis of the knees.  3. Morbid obesity.  4. History of colon cancer status post resection.   PLAN:  1. He will stay with MS Contin 30 mg q.12 h #16 and Vicodin 7.5 one      q.8 h p.r.n. #90.  2. We discussed again bowel regimens and the importance of keeping his      stool regular.  3. I will see him back in about 3 months' time with 1 month nursing      followup.      Ranelle Oyster, M.D.  Electronically Signed     ZTS/MedQ  D:  09/25/2008 12:39:29  T:  09/26/2008 06:36:34  Job #:  [id number]

## 2010-06-03 NOTE — Assessment & Plan Note (Signed)
MEDICAL RECORD NUMBER: [id number]   Margues is back regarding his low back and knee pain. He is doing very well  with the Avinza at 30 mg q.12h. He has his bowels regulated fairly nicely.  Pain is in the 5-7 out of 10 range. He describes the pain as constant,  aching and burning. It interferes with his general activity, relations with  others and general life on a moderate level. The patient seems to be  generally worse in the morning when he wakes up and at the end of the day.  He is sleeping well. He finds that his pain for the most part is pretty  manageable at this point. Pain increases with walking and standing prolonged  periods of time and improves with rest and the medications he is using. He  uses Vicodin for break-through pain. He uses Celebrex for exacerbation of  his pain as well.   REVIEW OF SYSTEMS:  The patient reports occasional numbness and tingling. He  has not lost any significant weight. His constipation is under general  control. A full review of systems is in the Health and History section of  the chart.   SOCIAL HISTORY:  The patient is married. He helps with his grandchildren  during the day. [facility] when the kids go back  to school. He has been involved in some summer camps over the summer.   PHYSICAL EXAMINATION:  VITAL SIGNS:  Blood pressure is 149/65, pulse is 71,  respiratory rate 18, saturating 96% in room air.  GENERAL:  The patient is pleasant, in no apparent distress. He remains  morbidly obese. No appreciable weight changes are noted. Gait is much  improved with good speed and balance noted today. He does let the walker get  out a bit too far in front of him. Muscle strength generally is 5 out of 5  throughout except for some occasional pain inhibition.  BACK:  Range of motion stable.  EXTREMITIES:  He has 1+ edema in bilateral lower extremities. He also has  chronic vascular changes in the extremities.  HEART:  Regular rate  and rhythm.  LUNGS:  Clear.  ABDOMEN:  Soft, bowel sounds are positive.   ASSESSMENT:  1.  Multifactorial low back pain due to obesity, lumbar facet disease and      questionable SI joint dysfunction.  2.  Bilateral osteoarthritis of the knees.   PLAN:  1.  The patient is doing generally well at this point. Needs to focus on      weight loss and exercise. He tells me he is committed to doing that      beginning this fall when the kids go back to school.  2.  Continue Avinza 30 mg q.12h.  3.  Vicodin 7.5 is being used for break-through pain, one q.8-12h p.r.n.  4.  Celebrex p.r.n. for break-through pain.  5.  Continue C-Pap use.   I will see the patient back in 3 month's time. He will follow up with the  nurses here in 1 month.       ZTS/MedQ  D:  08/23/2004 13:59:07  T:  08/24/2004 06:41:05  Job #:  [id number]   cc:   Caryn Bee L. Little, M.D.  [address]  [place]  [place] [address]  Fax: [phone]

## 2010-06-03 NOTE — Assessment & Plan Note (Signed)
HISTORY:  The patient is here in followup of his low back and knee pain.  We  started him on Kadian on the last visit, and he really did not have  significant results with this.  He still rates his pain at a 6/10 on  average.  It is bothersome in the low back and buttock regions, as well as  the knees.  The pain is made worse with walking, and especially walking  longer distances, as well as standing.  The pain is also bad with sitting  and bending.  The pain is best with rest, heat and his medications.  He  continues on Celebrex at 200 mg daily.  He is also using Vicodin 7.5 mg two  to three times daily, which is a slight improvement.  He was unable to go  through the open MRI due to some scheduling problems.  He has joined the  [facility] and has been doing some water treatments in the hot tub.  He has found  this therapeutic at this point.  He has not joined any eating groups.  He  continues with the CPAP at night for sleep.   REVIEW OF SYSTEMS:  The patient denies any chest pain, shortness of breath,  cold, flu, wheezing, or coughing symptoms.  He denies seizure, weakness,  numbness, confusion, vertigo, anxiety or depression, problems with sleep, or  with hearing.  He denies nausea, vomiting, diarrhea, constipation, bowel or  bladder incontinence.  Denies fevers, chills, new skin breakdown, or weight  changes.   PHYSICAL EXAMINATION:  VITAL SIGNS:  Blood pressure 137/59, pulse 75,  respirations 16, saturation 96% on room air.  GENERAL:  The patient walks with a wide-based antalgic gait.  He is fairly  stable today.  He needs extra time for transferring from uneven surfaces.  Affect is bright and appropriate.  He is well-kempt.  SKIN:  Intact throughout.  PULSES:  Are 2+.  EXTREMITIES:  The patient has significant lower extremity edema and adipose  tissue throughout.  BACK/NEUROLOGIC:  The low back examination is stable with some diminished  range of motion.  Provocative testing was  equivocal, particularly the facet  maneuvers.  Strength is 4+ to 5/5 throughout.  Sensory examination is  nonfocal.  The knees display crepitus bilaterally with range of motion,  particularly with flexion and extension.  Meniscal maneuvers were negative.   ASSESSMENT:  1. Low back pain, which is multifactorial, due to probable facet disease and     questionable sacroiliac joint dysfunction.  Obviously his obesity plays a     large factor here as well.  2. Bilateral knee pain, related to osteoarthritis.   PLAN:  1. Will increase Kadian to 20 mg q.12h., #60 were dispensed today.  2. We injected both knees via the posterior lateral approach today with 40     mg of Kenalog and 3 mL of 1% lidocaine.  The patient tolerated these     well.  Hopefully this will allow him to increase his mobility and     exercise a bit.  3. I wrote a prescription for an open MRI.  Hopefully this will suffice to     allow him to have his test done.  4. I have suggested Weight Watchers' or another similar eating group.  5. Continue with Vicodin 7.5 mg for break-through pain, as well as Celebrex     200 mg daily for now.  6. I will see the patient back in one month's time.  Ranelle Oyster, M.D.   ZTS/MedQ  D:  09/09/2003 10:14:34  T:  09/09/2003 11:37:39  Job #:  [id number]

## 2010-06-03 NOTE — Assessment & Plan Note (Signed)
Brian Richmond is here in followup of his low back and knee pain.  He has been fairly  stable with the MS Contin.  He is using 15 mg every 12 hours.  He will  occasionally skip one pill when he notes he becomes more constipated.  He  uses Vicodin 7.5 at nighttime to help him to sleep which works nicely.  He  uses Celebrex 200 mg daily.  He has joined the [employer] but really is not active  with his membership.  He is responsible daily for grandchildren which  consume his daytime hours.  He is using CPAP at nighttime.  He has made no  significant dietary or weight changes since we last talked.  He is fairly  comfortable with his pain levels at this point.  He notes that his pain  ranges from a 3 to 6 out of 10.  The pain is most often in the low back and  upper buttock area as well as the knees.  The pain improves with rest and  medication.  It is usually worse with walking.  He notes that when he walks  longer distances his knee pain particularly will flare up.   SOCIAL HISTORY:  He plans to have more free time in the coming year as the  children will be in school part time.   REVIEW OF SYSTEMS:  The patient reports a recent cold symptoms, occasional  blurred vision.  He has not had a recent visual checkup.  He denies any  dizziness, depression, nausea, vomiting, diarrhea.  He does report  occasional constipation.  No weight changes are noted.   PHYSICAL EXAMINATION:  VITAL SIGNS:  Reveal a blood pressure of 154/72,  pulse is 78, respiratory rate is 16.  He is sating 95% on room air.  GAIT:  The patient walks using his walker with a shuffling type gait  pattern.  AFFECT:  Bright and appropriate.  APPEARANCE:  Normal.  He remains obese and unchanged from prior visits.  SKIN:  Intact.  HEENT:  Pupils are equal, round and reactive to light and accommodation.  Extraocular eye movements are intact.  MUSCULOSKELETAL:  Extremities reveal some residual edema, adipose tissue.  Low back is stable without  significant testing.  The patient had equivocal  provocative maneuvers today.  Strength in the range of 4 to 4+ out of 5  throughout.  Sensory exam is nonfocal.  Knees are stable with movements  active and passive today.   ASSESSMENT:  1.  Multifactorial low back pain due to obesity, lumbar facet disease, and      questionable sacroiliac dysfunction.  2.  Bilateral osteoarthritis of the knees.   PLAN:  1.  The patient is relatively stable at this point.  2.  We will continue MS Contin at 15 mg q.12h., #60 were dispensed today.  3.  He will continue with:      1.  Vicodin 7.5 for breakthrough pain.      2.  Celebrex 200 mg daily with food.  We should look towards decreasing          or stopping the Celebrex in the future months.  4.  Encourage exercise and weight loss as well as dietary changes.  The      patient needs to take these seriously.  5.  Continue with CPAP at night.  6.  The patient will follow up in about three months' time with our P.A.      Brian Richmond  ZTS/MedQ  D:  01/01/2004 11:37:28  T:  01/01/2004 15:40:48  Job #:  [id number]

## 2010-06-03 NOTE — Assessment & Plan Note (Signed)
Brian Richmond is back regarding his chronic back and knee pain.  He has been doing  well with the Avinza.  His pain ranges from a 5-6/10.  He still has not lost  any significant weight.  He stays active with his family and some work, but  this has not been anything physically active to lose weight.  His diet  remains about the same.  He has had some problems intermittently with  constipation but uses a Dulcolax suppository to manage that.  Pain is worse  in the knees when he first awakes in the morning and seems to get a bit  better as he gets moving during the daytime.   REVIEW OF SYSTEMS:  The patient reports some numbness and tingling in the  legs, especially with the knee pain, which is transient.  Constipation as  noted above.  Other review of system items are in the health and history  section of the chart.   SOCIAL HISTORY:  The patient is married and without change today.   PHYSICAL EXAMINATION:  VITAL SIGNS:  Blood pressure 145/60, pulse 74,  respiratory rate is 16, he is saturating 98% on room air.  GENERAL:  The patient remains morbidly obese, pleasant, in no acute  distress.  He is alert and oriented x3.  Gait is antalgic and forward-flexed  in posture as the patient ambulates.  Coordination is fair.  Reflexes are  1+.  Sensation is normal.  CARDIAC:  Regular rate and rhythm.  LUNGS:  Clear.  ABDOMEN:  Soft, nontender.  MUSCULOSKELETAL/NEUROLOGIC:  Motor exam is 5/5.  He does have some pain  inhibition at the knees and proximal lower extremities.  Back exam is  without change, and pain is noted with extension and flexion movements  today.  EXTREMITIES:  Extremities show 1-2+ edema distally.  The patient has chronic  vascular changes noted in the legs.   ASSESSMENT:  1.  Chronic low back pain due to obesity, lumbar facet disease and      sacroiliac dysfunction.  2.  Bilateral osteoarthritis of the knees.   PLAN:  1.  The patient has been fairly stable and very adherent to his  medication      regimen.  Continue Avinza 30 mg q.12h.  Vicodin will be used q.8h.      p.r.n. at the 7.5 mg dose.  2.  We discussed again today regarding weight loss, proper diet and      exercise.  3.  I will see the patient back in four months.  He will see the [facility] clinic      in two months' time.      Ranelle Oyster, M.D.  Electronically Signed     ZTS/MedQ  D:  03/07/2005 15:58:02  T:  03/08/2005 14:20:31  Job #:  [id number]

## 2010-06-03 NOTE — Procedures (Signed)
NAME:  Brian Richmond, Brian Richmond                ACCOUNT NO.:  [id number]   MEDICAL RECORD NO.:  [id number]          PATIENT TYPE:  REC   LOCATION:  [facility]                          FACILITY:  [facility]   PHYSICIAN:  Ranelle Oyster, M.D.DATE OF BIRTH:  [birth year]   DATE OF PROCEDURE:  06/27/2005  DATE OF DISCHARGE:                                 OPERATIVE REPORT   PROCEDURE:  Synvisc injection, diagnostic code 715.96.   DESCRIPTION OF PROCEDURE:  After informed consent and sterile preparation  with Betadine, we aspirated and injected both knees using 2 mL of  Synvisc  solution.  A 21 gauge 2-inch needle was utilized.  The patient experienced  no complications.  The areas were cleaned and dressed after injection.  The  patient will follow up for second of three injections in 7-10 days.      Ranelle Oyster, M.D.  Electronically Signed     ZTS/MEDQ  D:  06/27/2005 12:22:25  T:  06/27/2005 13:33:35  Job:  [id number]

## 2010-06-03 NOTE — Assessment & Plan Note (Signed)
MEDICAL RECORD NUMBER:  [id number]   Brian Richmond is here in follow-up of his chronic back and knee pain. He has been  doing fairly well with the Avinza. He experiences some occasional  constipation with the medication but otherwise feels that it is helping him  a great deal. His pain is at a 5/10 on average. Still complains of pain in  the knees and low back. Pain interferes with general activity, relations  with others, and enjoyment of life on a moderate level. Pain is worse in the  morning and sometimes increases with his activities. He still has not gone  on an exercise schedule. He is working on dietary control and improving his  intake.   REVIEW OF SYSTEMS:  The patient reports occasional constipation. He is using  a CPAP at night. Otherwise, denies any neurological, psychiatric,  constitutional, GU, GI, or cardiorespiratory complaints today.   SOCIAL HISTORY:  The patient remains married. He volunteers with his church.  [facility].   PHYSICAL EXAMINATION:  Blood pressure is 166/82, pulse is 65, respiratory  rate is 16, he is saturating 98% in room air. The patient is pleasant, in no  acute distress. He remains morbidly obese. He ambulates with a walker. He  seems to be transferring better from a sitting to standing position. He  still walks with a wide-based, shuffling type of gait. Muscle strength is  generally 5/5. He has occasional pain inhibition at the knee and the  proximal legs. Low back exam is stable with pain with extension and flexion  movements. Heart is regular rate and rhythm, lungs are clear, abdomen soft  and nontender. Extremities show 1+ edema distally. Chronic vascular changes  are noted.   ASSESSMENT:  1.  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  2.  Bilateral osteoarthritis of the knees.   PLAN:  1.  The patient continues to do well.  Continue with Avinza 30 mg q.12h.      scheduled and Vicodin 7.5 q.8h. p.r.n.  2.  Encouraged ongoing dietary control and exercise program.   Will have him see the nurse clinic in about 5-6 weeks. He will see me back  in about 3 months' time.      Ranelle Oyster, M.D.  Electronically Signed     ZTS/MedQ  D:  11/22/2004 13:21:15  T:  11/22/2004 15:22:56  Job #:  [id number]

## 2010-06-03 NOTE — Assessment & Plan Note (Signed)
MEDICAL RECORD NUMBER:  [id number].   Va is back regarding his low back and knee pain. I feel that he has hit a  plateau at his current level. He is using Celebrex daily at 200 mg and  sometimes 400 mg. He feels that the 400 mg is often very helpful. His  activity is very limited. He is not using his [employer] as he is  afraid to go out and also because he hurts too much to go the distance it  will require. He sleeps fairly well. He will sit often in his den watching  television. He will hold off on going to the bathroom as long as  possible  due to the pain and effort required to get the bathroom. Currently, Aymen is  taking MS Contin 15 mg q.12h. as well as Vicodin 7.5 mg q.8h. p.r.n. The  patient describes his pain as constant and aching. He states he can walk  about two minutes without stopping.   REVIEW OF SYSTEMS:  The patient reports some numbness, trouble walking,  constipation, depressed mood.   SOCIAL HISTORY:  The patient is married and activity is minimal.   PHYSICAL EXAMINATION:  GENERAL:  Blood pressure is 180/80 although there is  some question over the size of the cuff. Pulse is 106, respiratory rate 20,  saturating 97% on room air. The patient remains morbidly obese. Affect is  generally calm but a bit depressed. Gait is of wide-based variety.  Coordination is fair. Reflexes are 1+.  HEART:  Regular rate and rhythm.  LUNGS:  Clear.   Knees are stable with manipulation today. He has preserved strength in all  four extremities. Sensory exam was normal. Cranial nerve exam is intact. He  has 1+ edema in both lower extremities. Chronic vascular changes noted as  well.   ASSESSMENT:  1.  Multifactorial low back pain due to obesity. Lumbar facet disease and      questionable S1 joint dysfunction.  2.  Bilateral osteoarthritis of the knees.   PLAN:  1.  We discussed at length his needs for activity. Not only from a pain      standpoint but from a  cardiopulmonary standpoint, he needs to become      more active. To help him achieve this goal, I would like to switch him      over to Avinza and increase his dosing to 30 mg q.12h. Hopefully, this      will provide him better wall to wall pain control.  2.  He will continue with Vicodin 7.5 for breakthrough pain.  3.  I would like to see him decrease his Celebrex to 200 mg q.d. p.r.n. I      think he is at risk for complications related to regular use of Celebrex      which he has been on for some time.  4.  Continue CPAP at night.  5.  Encourage participation at the [facility] for exercise, particularly poor      therapies. He needs to work on his diet as well. We talked about these      seriously today, and patient expressed an understanding.  6.  Recommended trial of glucosamine chondroitin.  7.  Recommended possibly omega 3 fatty acids for pain control.  8.  He may benefit from diclofenac gel 3%. I am sure if his pharmacy      benefits will cover this, however.  9.  I will see the patient back in about  a month's time.      ZTS/MedQ  D:  04/19/2004 14:12:52  T:  04/19/2004 15:27:30  Job #:  [id number]   cc:   Caryn Bee L. Little, M.D.  [address]  [place]  [place] [address]  Fax: [phone]

## 2010-06-03 NOTE — Assessment & Plan Note (Signed)
Rama is back regarding his low back and knee pain.  His Avinza holds him  fairly well.  His pain is a 6-7/10.  He uses 2-3 Vicodin a day for  breakthrough pain.  He is still trying to work on weight loss, but has not  been successful.  He says he eats a lot of sweets and drinks sugared soft  drinks.  His pain interferes with general activity, relationships with  others and enjoyment of life on a severe level.  We talked about efforts in  the past to lose weight and see a dietician, and he had seen someone 6 or 7  years ago.   REVIEW OF SYSTEMS:  The patient denies any new neurological, psychiatric,  constitutional, GU, GI, or cardiorespiratory complaints today.  His bowels  have been pretty well regulated at this point.   SOCIAL HISTORY:  The patient got through the summer of camp counseling,  etc., without any issues.   PHYSICAL EXAMINATION:  VITAL SIGNS:  Blood pressure is 172/70, pulse is 75,  respiratory rate 18.  He is satting 97% on room air.  GENERAL:  The patient is pleasant, in no acute distress.  He remains  morbidly obese.  No discernible change has been made in his weight.  Knees  are antalgic with ambulation, right slightly greater than the left.  He has  some pain with resisted extension and flexion.  No focal instability was  appreciated today.  BACK:  Low back was slightly tender in the lower lumbar facets and  paraspinals.  Exam is slightly difficult due to weight.  EXTREMITIES:  1+ edema with chronic vascular signs.  HEART:  Regular.  CHEST:  Clear.  ABDOMEN:  Soft and nontender.   ASSESSMENT:  1. Multifactorial low back pain related to obesity, lumbar facet disease      and questionable sacroiliac joint dysfunction.  2. Bilateral osteoarthritis of the knees.   PLAN:  1. Will make a referral to [facility] to help the patient with      appropriate foods and weight loss.  We had another talk about this      today, and eventually the decision will be made  to lose weight, either      after significant health events or hopefully before on a voluntary      basis.  The Avinza seems to hold him pretty well, but likely will not      be a long-term fix for him, at least at the current dose.  2. I refilled Avinza, #60, and Vicodin, #90, today.  3. I will see the patient back in 3 months' time.  He will be seen in the      [facility] clinic in 1 month.  Consider followup with Synvisc injections at a      later point.      Ranelle Oyster, M.D.  Electronically Signed     ZTS/MedQ  D:  10/04/2005 10:06:00  T:  10/05/2005 11:03:53  Job #:  [id number]

## 2010-06-03 NOTE — Assessment & Plan Note (Signed)
Brian Richmond is back regarding his chronic low back pain and osteoarthritis of his  knees.  Tyrese has been doing fairly well with his pain.  He notes a bit of  an upturn in his pain since stopping the Celebrex.  His pain is a 6 to 7 out  of 10 today.  The pain is dull, aching, and constant.  Knees are most  effected followed by his low back.  The pain interferes with general  activity, relations with others, and enjoyment of life on a moderate level.  Takeem has not lost any __________  weight since I last saw him.  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   REVIEW OF SYSTEMS:  The patient reports numbness and tingling.  He has  occasional constipation, although he is using a laxative now which is  working a bit better.  A full review of systems is in the health and history  section of the chart.   SOCIAL HISTORY:  The patient is married and living with his wife.   PHYSICAL EXAMINATION:  VITAL SIGNS:  Blood pressure is 172/84, pulse is 90,  respiratory rate 20, sating 96% on room air.  GENERAL:  The patient is pleasant, no acute distress.  He is alert and  oriented x3.  Affect is bright and appropriate.  MUSCULOSKELETAL:  Gait is wide based and antalgic either side.  The patient  remains morbidly obese.  Coordination is fair.  Reflexes are 2+.  Sensation  is normal.  Motor function is generally 5/5.  He has crepitus in both knees.  No obvious instability is noted.  HEART:  Regular rate and rhythm.  LUNGS:  Clear.  ABDOMEN:  Soft nontender.   ASSESSMENT:  1.  Chronic low back pain due to lumbar facet disease, sacroiliac      dysfunction, and morbid obesity.  2.  Bilateral osteoarthritis of the knees.   PLAN:  1.  We will set the patient up for Synvisc injections of both knees.  The      patient had positive results previously with steroid injection.  We will      see how he does with the Synvisc.  He will have  a series of three      injections to each knee.  2.  I have refilled his Avinza 30 mg q.12h. and Vicodin 7.5 one q.8h. p.r.n.      #60 and #90 respectively.  3.  I gave the patient samples of Celebrex 200 mg every day.  He may use      Aleve also one to two tablets q.12h. p.r.n.  4.  Reiterated once again diet and exercise importance.  5.  I will see the patient back pending scheduling of his injections.      Ranelle Oyster, M.D.  Electronically Signed     ZTS/MedQ  D:  06/13/2005 13:04:55  T:  06/13/2005 14:35:39  Job #:  [id number]

## 2010-06-03 NOTE — Assessment & Plan Note (Signed)
Wednesday, December 27, 2005:   Brian Richmond is back regarding his chronic low back and knee pain. He continues  to do well with the Avinza and the Vicodin. It allows him to be active  at home and make it through his every day living activities. He rates  his pain an average of 6 out of 10. He describes it as constant and  burning. He has made no progress with his weight. He has had problems  with his teeth and will require his teeth pulled due to dental cavities,  abscess, etc. He is going through cardiac clearance before he has this  done.   REVIEW OF SYSTEMS:  The patient reports numbness, constipation. Denies  any shortness of breath. He does remain on CPAP at night for sleep  apnea. No chest pain is noted. Mood has been fair to good. Full reviews  in the health and history section.   SOCIAL HISTORY:  The patient is married living with his wife.   PHYSICAL EXAMINATION:  Blood pressure is 160/70, pulse is 80,  respiratory rate 16. The patient is generally pleasant in no acute  distress. He remains morbidly obese. He has difficulty transferring from  a sitting to a standing position, but was able to do so independently.  Legs were antalgic bilaterally, right greater than left. He stands with  a slightly flexed posture while using his walker. Back is generally  tender in the lumbar paraspinals and facet region. His strength is 5/5.  Sensory sensation is intact. Extremities show 1+ to 2+ edema with  chronic vascular changes. Heart is regular rate. Chest is clear. Abdomen  is soft, nontender.   ASSESSMENT:  1. Multifactorial low back pain.  2. Bilateral osteoarthritis of the knees.  3. Morbid obesity.   PLAN:  1. Continue Avinza 30 mg q. 12 hours #60 and Vicodin 7.5 one q. 8      hours p.r.n. #90.  2. Encourage appropriate diet and exercise, although we have talked      about this on many occasions.  3. The patient will see the nurse, coming back in two months. I will      see him in  one month. He will come for refills of his Avinza in one      month's time.      Ranelle Oyster, M.D.  Electronically Signed     ZTS/MedQ  D:  12/27/2005 10:34:15  T:  12/27/2005 11:09:55  Job #:  [id number]

## 2010-06-03 NOTE — Procedures (Signed)
NAME:  Brian Richmond, Brian Richmond                ACCOUNT NO.:  [id number]   MEDICAL RECORD NO.:  [id number]          PATIENT TYPE:  REC   LOCATION:  [facility]                          FACILITY:  [facility]   PHYSICIAN:  Ranelle Oyster, M.D.DATE OF BIRTH:  Oct 29, 1955   DATE OF PROCEDURE:  DATE OF DISCHARGE:                                 OPERATIVE REPORT   PROCEDURE PERFORMED:  Synvisc injection.   DIAGNOSIS:  Osteoarthritis, bilateral knees.  ICD9 code 16.96.   DESCRIPTION OF PROCEDURE:  After informed consent, sterile preparation of  both knees with Betadine, we aspirated and then injected  bilateral knees  using 2 mL of Synvisc solution.  A 21 gauge 2-inch needle was utilized.  The  patient experienced no problems except for minor bleeding today.  Areas were  cleaned post injection and dressed.  This was the third of three injections.  He has actually had good results so far with less crepitus, grinding and  pain.  I will see him back in about three months' time.  We will schedule  him for the RN clinic in one month.  I did fill his Avinza today 30 mg  q.12h. #60.      Ranelle Oyster, M.D.  Electronically Signed     ZTS/MEDQ  D:  07/12/2005 11:10:53  T:  07/12/2005 11:20:11  Job:  [id number]

## 2010-06-03 NOTE — Assessment & Plan Note (Signed)
MEDICAL RECORD NUMBER:  [id number]   HISTORY OF PRESENT ILLNESS:  Brian Richmond is here for follow up of his knee and low  back pain.  We started him on Kadian initially and we switched his  medications over to MS Contin due to price.  He felt that his pain was well  controlled in the low back, particularly with the MS Contin.  It dropped his  pain to a 3-4/10 level.  We injected his knees at the last visit as well,  and he had about three weeks of relief with the injection, and these  appeared to have returned to a prior level of pain.  He has joined the [employer]  and has started some basic exercises there.  He is using Vicodin 7.5 at  nighttime only while he is on the MS Contin.  He uses Celebrex 200 mg daily.  The pain is relegated to the low back as well as bilateral knees at this  point.  No significant weight loss has been made at this point.  He uses  CPAP at night.   REVIEW OF SYSTEMS:  The patient denies any new chest pain, shortness of  breath, cold, flu, coughing or wheezing symptoms.  He does report occasional  numbness in the peripheral extremities.  Denies spasm, vertigo, confusion,  problems with mood, anxiety, sleep, agitation or headaches.  Denies nausea,  vomiting or diarrhea, problems with urination, bowel or bladder  incontinence.  He does report constipation.  Has occasional rash, but no  skin changes, fever, chills or sweating.   PHYSICAL EXAMINATION:  VITAL SIGNS:  Blood pressure 161/84, pulse 70,  respirations 18, saturating 96% on room air.  NEUROLOGICAL:  The patient walks with a wide based gait, some __________  bilaterally.  He uses his rolling walker for support.  He appears generally  safe and does not need assistance.  Affect is bright, and he remains well  kept and grossly obese.  Skin is intact for the most part.  EXTREMITIES:  No cyanosis, clubbing and trace edema.  Knees were slightly  painful valgus and varus stresses.  No knee disability was noted.  Lower  extremity strength was 5/5.  Normal sensory exam and reflexes.  Low back was  stable with some localized tenderness with palpation and with facet  maneuvers.  Straight-leg testing was negative.   ASSESSMENT:  1.  Low back pain which remains multifactorial due to facet disease,      questionable SI joint dysfunction and obesity.  2.  Bilateral knee pain due to osteoarthritis.   PLAN:  1.  Will continue with MS Contin 15 mg q.12h., #60.  2.  We did not inject knees again as this is likely only to provide short      term benefits.  3.  Encouraged exercise and weight loss.  I am encouraged by his activities      through the [facility].  4.  May continue Vicodin 7.5 mg for breakthrough pain q.h.s. p.r.n. as well      as Celebrex 200 mg daily with food.  5.  I will see the patient back in two months time.       ZTS/MedQ  D:  10/30/2003 10:31:41  T:  10/30/2003 11:23:30  Job #:  [id number]

## 2010-06-03 NOTE — Group Therapy Note (Signed)
MEDICAL RECORD #[id number]   CHIEF COMPLAINT:  Low back and bilateral knee pain.   HISTORY OF PRESENT ILLNESS:  This is a 55 year old morbidly-obese male with  a two-and-a-half year history of low back and knee pain.  He states he has  had arthritis for 10-12 years overall in the back and knees.  Over the last  two-and-a-half years the knee pain has worsened and two-and-half-years where  the low back pain has taken place and increased.  The patient has had no  focal treatment per se for the back pain.  He recently had a course of  aquatic therapy which was somewhat helpful.  He has tried over-the-counter  medications and antiinflammatories without benefit.  He has been on  hydrocodone for some time, approximately 2+ years at the 7.5 mg dose for  pain.  He is using three to four of these tablets a day.  He has also been  on Celebrex for 5 years at the 200 mg dose.  The patient has not had any  injections in the back or knees.  He has not had any type of surgery.  He  has not had an MRI of his back performed.  The patient also denied films of  his knees being done.   The patient tells me that his pain is worse when he walks or stands for any  period of time.  When he stands the back and buttock areas begin to hurt,  followed by the knees, which require him to bend over on his walker or bend  over in the standing position to help relieve symptoms.  He is really unable  to walk without his rolling walker at this point.  He is only able to walk  approximately 15-25 feet at a time without a rest break.  He finds that heat  sometimes helps the back as well as sleeping with a pillow between the legs.  He has been generally limited by his pain and obesity for some time.  The  patient denies numbness in the legs or feet.  He has no frank weakness in  the legs or feet as well.  Bowel and bladder function have been generally  intact.  He occasionally has an overflow incontinence.   The patient  currently is on CPAP which he uses nightly.  He states that he  uses this faithfully and has had no recent problems with this and generally  tolerates this fine.  His sleep is interrupted by his pain, however.   PAST MEDICAL HISTORY:  1. Morbid obesity.  2. Severe obstructive sleep apnea.  3. Osteoarthritis.  4. History of carpal tunnel syndrome with hand numbness.   PAST SURGICAL HISTORY:  Negative.   CURRENT MEDICATIONS:  1. Vicodin 7.5 one three to four times a day.  2. Celebrex 200 mg daily.  3. Sudafed two p.o. daily p.r.n.  4. Tylenol one to two daily p.r.n.   ALLERGIES:  PENICILLIN possibly.   SOCIAL HISTORY:  The patient is married with a son.  He has never smoked.  He worked last in 2001 but has been on disability essentially since that  time.   FAMILY HISTORY:  Significant for high blood pressure.   REVIEW OF SYSTEMS:  The patient denies any chest pain, heart troubles.  He  does have shortness of breath but he denies cold, flu, wheezing, or  coughing.  The patient does report numbness and problems with his sleep as  mentioned above.  Denies spasms,  confusion, stroke, weakness, anxiety, or  depression.  He denies headache, nausea, vomiting, reflux, diarrhea,  constipation, bowel incontinence.  He does have the urinary overflow  incontinence.  He denies swelling, skin breakdown, or bruising.  He has  tried to lose weight in the past but has been unsuccessful and generally  regained weight after diet plans.   PHYSICAL EXAMINATION TODAY:  The patient is pleasant, in no acute distress.  The patient walks with a wide-based shuffling-type of gait pattern.  He is  morbidly obese.  His weight by his account is 465 pounds.  Blood pressure is  147/75, pulse is 75, respiratory rate is 28, he is saturating 96% on room  air.  The patient was in a gown for me in the room when I entered.  The  patient was able to stand from a seated position on the exam table and  assume a  semi-upright position with the support of his walker.  With full  back extension the patient had pain develop in the lower back and buttock  regions.  The patient actually flexed very well without discomfort today.  Lateral bending and lateral rotation caused some discomfort in the low back  area but not to the extent forward extension caused pain.  The patient had  minimal tenderness in the mid to upper lumbar regions.  The patient had some  palpatory tenderness in the low back regions in the area of the facets and  paraspinals although his exam was difficult secondary to his excessive  adipose tissue.  PSIS areas were slightly tender.  He had some mild  tenderness along the pyriformis regions as well and greater trochanter  areas.  The patient sat back up on the table for me and was noticeably out  of breath after just standing in the stationary position.  On examination of  the lower extremities he had really 4+ to 5 out of 5 strength throughout.  Sensory exam was intact.  He had a restriction of the legs with knee bending  and extending as well as flexion extension of the hip due to his size and  pain at both knees.  Crepitus at both knees is noted with the extension and  flexion exercises.  Straight leg testing was essentially only positive for  hamstring tightness.  Patrick's test was only positive for knee pain.  Compression test was negative.  Pyriformis testing was negative to  equivocal.  Facet maneuvers were positive today.  Upper extremity strength  was 5/5.  The patient did have some sensory loss in the hands.  Phalen's  test was positive.    Extremities showed no clubbing, cyanosis, but 1+ to 2+ edema.  The patient's  heart rate was regular and chest was clear.  Skin was warm.  Abdomen was  obese but unremarkable otherwise.   ASSESSMENT:  1. Morbid obesity.  2. Low back pain due to degenerative joint disease most likely with probable    facet involvement although I cannot  rule out sacroiliac joint     dysfunction.  Obviously his weight plays a large factor in this pain.  3. Bilateral knee pain, likely related to osteoarthritis.   PLAN:  1. Obviously, the most long-term solution here is weight loss although I     know this is a bit of a Catch-22 at this point.  2. We will start today with beginning him on a low-dose long-acting opioid.     He is not a candidate for fentanyl  patch due to his size but we will     start him on low-dose Kadian at 20 mg daily to avoid any respiratory     depression issues.  He may continue with his Vicodin for breakthrough     pain at the 7.5 mg dose although I would encourage him to decrease the     amount and hopefully with the Kadian he will be able to do so.  3. I will send him for an open MRI of his lumbar spine to discern any focal     pathology.  4. May consider bilateral knee injections although these are only short-term     solutions here.  5. The patient may need to consider gastric bypass surgery.  6. I hope that by decreasing his pain level a bit we can increase his     activity and help him with his weight and activity but the patient is     going to have to make some sacrifices regarding his diet and habits and     he is aware of     that.  Whether he will do those things is another question.  7. I will see the patient back in 1 month's time.     Ranelle Oyster, M.D.   ZTS/MedQ  D:  08/05/2003 12:50:01  T:  08/05/2003 14:12:00  Job #:  [id number]   cc:   Caryn Bee L. Little, M.D.  [address]  [place]  [place] [address]  Fax: [phone]

## 2010-06-03 NOTE — Assessment & Plan Note (Signed)
FOLLOWUP OFFICE NOTE   Sakib is back regarding his chronic pain including low back and knees.  Avinza seems to provide fair control of his pain.  He has a lot of pain  upon wakening in the mornings.  His health insurance  plan will not  cover Celebrex although he finds that this has worked the best for him  despite trials of over-the-counter medications including Advil and Aleve  as well as Vioxx and Bextra.  He rates his pain at 8/10.  Describes it  as a burning, stabbing, aching, and constant.   His general activity during the day surrounds taking care of his  grandchildren.  He does not get much activity outside the house.  He has  made no efforts to loose weight or increase his exercise.  He states  that exercise causes him significant pain.  He can walk about 1-2  minutes without having to stop.  He can climb 3-4 steps at a time  without rest.  He uses a walker for his main means of mobility.  Sleep  is good.  His pain interferes with general activity, relations with  others and enjoyment of life on a severe level.   REVIEW OF SYSTEMS:  The patient reports occasional bladder control  problems.  Other pertinent problems are listed on the 11 full reviews in  the health and history section of the chart.   SOCIAL HISTORY:  The patient is married and lives with his wife.  Both  his wife and his son are obese.  He states that his son is equally  overweight as him.  His son is on an outpatient dietary program.   PHYSICAL EXAMINATION:  Blood pressure is 155/74, pulse is 93,  respiratory rate 16, sating 98% on room air.  The patient remains  morbidly overweight.  He continues to have difficulty with ambulation  and sit transfers.  He shifts his weight heavily toward the locker for  support.  He stands with a flexed posture.  Back remains tender in the  paraspinal and facet region and in the lumbar spine.  Strength is 5/5.  Sensory exam is intact.  Heart is regular.  Chest is clear.   ASSESSMENT:  1. Multifactorial back pain.  2. Bilateral osteoarthritis of the knees.  3. Morbid obesity.   PLAN:  1. Continue Avinza at the current dose of 60 mg q. day.  2. Vicodin for breakthrough pain 7.5, 1 q. 8 hours p.r.n., #90.  3. Refilled Celebrex.  We gave him a two-week sample voucher as well      as a pharmacy refill.  We will work with his insurance to see if we      can get this covered for him.  4. Make a referral to [facility] for weight loss and      overall health maintenance.  5. Will see the patient back in 3 months' time.  I will see in the      nurse clinic in 1 month.      Ranelle Oyster, M.D.  Electronically Signed     ZTS/MedQ  D:  05/09/2006 11:11:57  T:  05/09/2006 11:42:29  Job #:  [id number]

## 2010-06-21 ENCOUNTER — Encounter: Payer: Medicare FFS | Attending: Neurosurgery | Admitting: Neurosurgery

## 2010-06-21 DIAGNOSIS — M1710 Unilateral primary osteoarthritis, unspecified knee: Secondary | ICD-10-CM

## 2010-06-21 DIAGNOSIS — M25569 Pain in unspecified knee: Secondary | ICD-10-CM | POA: Insufficient documentation

## 2010-06-21 DIAGNOSIS — M5430 Sciatica, unspecified side: Secondary | ICD-10-CM

## 2010-06-21 DIAGNOSIS — F329 Major depressive disorder, single episode, unspecified: Secondary | ICD-10-CM | POA: Insufficient documentation

## 2010-06-21 DIAGNOSIS — M19049 Primary osteoarthritis, unspecified hand: Secondary | ICD-10-CM

## 2010-06-21 DIAGNOSIS — G8929 Other chronic pain: Secondary | ICD-10-CM | POA: Insufficient documentation

## 2010-06-21 DIAGNOSIS — F3289 Other specified depressive episodes: Secondary | ICD-10-CM | POA: Insufficient documentation

## 2010-06-21 DIAGNOSIS — G894 Chronic pain syndrome: Secondary | ICD-10-CM

## 2010-06-21 DIAGNOSIS — G5600 Carpal tunnel syndrome, unspecified upper limb: Secondary | ICD-10-CM | POA: Insufficient documentation

## 2010-06-22 NOTE — Assessment & Plan Note (Signed)
Brian Richmond is back today regarding his bilateral knee pain and some chronic low back pain.  His pain seems to fluctuate, but he states today he has not had really much in the way of change.  He is in a power chair, all the time.  He rates his pain at about 6 or 7.  It is a burning and aching type pain.  It is worse at night.  Walking and standing tend to aggravate.  Rest and medication helps.  He does use a walker, but uses his chair most of the time.  He does not drive or climb steps.  He can only walk for about a minute.  REVIEW OF SYSTEMS:  Notable for those difficulties with some constipation, urinary retention, sleep apnea, paresthesias in the lower extremities, trouble with ambulation, depression, bladder and bowel issues but no thoughts of suicidal tendency, and no aberrant behaviors.  Social history and past medical history are unchanged.  PHYSICAL EXAM:  VITAL SIGNS:  Blood pressure 105/34, pulse 67, respirations 24, O2 sats 98 on room air. NEUROLOGIC:  Sensation somewhat diminished in the lower extremities.  He has had some swelling in the lower extremities bilaterally, history of widespread osteoarthritis. CONSTITUTIONAL:  He is morbidly obese, in his electric chair today, and neurologically he is intact. PSYCH:  Oriented x3.  His affect bright.  ASSESSMENT: 1. Chronic low back pain. 2. Osteoarthritis of the knees. 3. Morbid obesity. 4. Depression. 5. Bilateral carpal tunnel syndrome.  PLAN: 1. Continue his medicine regimen with Dr. Riley Kill. 2. We are going to go ahead and refill his MS Contin CR 15 mg 3 every     12 hours, #108, but no refill; Vicodin 7.5/500 one p.o. q.8 h.,     #90, with no refill. 3. He will follow up with his neurologist regarding his possible     carpal tunnel surgery, and he will follow up in my clinic in 1     month.  His questions were encouraged and answered.     Jayna Mulnix L. Blima Dessert Electronically Signed    RLW/MedQ D:   06/21/2010 13:24:54  T:  06/22/2010 03:17:51  Job #:  [id number]

## 2010-07-18 ENCOUNTER — Encounter: Payer: Medicare FFS | Attending: Neurosurgery | Admitting: Neurosurgery

## 2010-07-18 DIAGNOSIS — F329 Major depressive disorder, single episode, unspecified: Secondary | ICD-10-CM

## 2010-07-18 DIAGNOSIS — M5430 Sciatica, unspecified side: Secondary | ICD-10-CM

## 2010-07-18 DIAGNOSIS — M1710 Unilateral primary osteoarthritis, unspecified knee: Secondary | ICD-10-CM | POA: Insufficient documentation

## 2010-07-18 DIAGNOSIS — F3289 Other specified depressive episodes: Secondary | ICD-10-CM | POA: Insufficient documentation

## 2010-07-18 DIAGNOSIS — G5600 Carpal tunnel syndrome, unspecified upper limb: Secondary | ICD-10-CM | POA: Insufficient documentation

## 2010-07-18 DIAGNOSIS — M545 Low back pain, unspecified: Secondary | ICD-10-CM | POA: Insufficient documentation

## 2010-07-18 DIAGNOSIS — G8929 Other chronic pain: Secondary | ICD-10-CM | POA: Insufficient documentation

## 2010-07-19 ENCOUNTER — Ambulatory Visit: Payer: Medicare FFS

## 2010-07-19 NOTE — Assessment & Plan Note (Signed)
Account [id number].  The patient of Dr. Riley Kill that follows up here today for his chronic low back pain.  He is in a power chair all the time.  He rates his pain today at about 6.  It is constant pain and it is dull, stabbing, and aching pain.  Pain is worse in the evening and night.  General activity level is about 10.  Walking and standing aggravate.  Rest and medication tend to help.  REVIEW OF SYSTEMS:  Notable for those difficulties as well as constipation and sleep apnea, paresthesias in the lower extremities.  He needs assistance with bathing and household duties.  He is on disability.  He is morbidly obese.  Oswestry score 58.  No signs of aberrant behaviors.  PAST MEDICAL HISTORY:  Unchanged.  SOCIAL HISTORY:  He is married.  He lives with his wife.  PHYSICAL EXAMINATION:  VITAL SIGNS:  Blood pressure 135/70, pulse 63, respirations 25, O2 sats 95 on room air.  His motor strength is 4/5 in the lower extremities.  It is hard for him to do any kind of resistance testing.  His sensation is intact. Constitutionally, again he is morbidly obese and he is in electric wheelchair.  He is alert and oriented x3.  His affect is bright.  Gait not tested.  ASSESSMENT: 1. Chronic low back pain. 2. Osteoarthritis of knees. 3. Morbid obesity. 4. Depression. 5. Bilateral carpal tunnel syndrome.  PLAN: 1. He will continue his current medicine regimen. 2. Refilled his MS Contin CR 15 mg three p.o. q.12 h 180 with no     refill. 3. Hydrocodone 7.5/500 one p.o. q.8 p.r.n. 90 with no refill.  He will     follow up in the clinic in 1 month.  Again, his questions were     encouraged and answered.     Jaedin Regina L. Blima Dessert Electronically Signed    RLW/MedQ D:  07/18/2010 16:13:15  T:  07/19/2010 04:40:07  Job #:  [id number]

## 2010-08-15 ENCOUNTER — Encounter: Payer: Medicare FFS | Admitting: Neurosurgery

## 2010-08-23 ENCOUNTER — Encounter: Payer: Medicare FFS | Attending: Neurosurgery | Admitting: Neurosurgery

## 2010-08-23 DIAGNOSIS — F329 Major depressive disorder, single episode, unspecified: Secondary | ICD-10-CM | POA: Insufficient documentation

## 2010-08-23 DIAGNOSIS — G894 Chronic pain syndrome: Secondary | ICD-10-CM

## 2010-08-23 DIAGNOSIS — G5600 Carpal tunnel syndrome, unspecified upper limb: Secondary | ICD-10-CM | POA: Insufficient documentation

## 2010-08-23 DIAGNOSIS — F3289 Other specified depressive episodes: Secondary | ICD-10-CM | POA: Insufficient documentation

## 2010-08-23 DIAGNOSIS — M545 Low back pain, unspecified: Secondary | ICD-10-CM | POA: Insufficient documentation

## 2010-08-23 DIAGNOSIS — M1710 Unilateral primary osteoarthritis, unspecified knee: Secondary | ICD-10-CM | POA: Insufficient documentation

## 2010-08-24 NOTE — Assessment & Plan Note (Signed)
Account [id number].  [name] is patient Dr. Riley Kill followed up for chronic low back pain. He is in a wheelchair all most all time.  He cannot ambulate very far at all.  He rates his average pain is unchanged at 6.  It is burning with paresthesias.  He has activity level of 10.  Pain is worse in the morning and night.  Sleep patterns are fair.  Pain is worse with standing.  Rest and medication tend to help.  He uses a walker or wheelchair all the time.  He supposedly drives.  He says he can climb steps on a sheet.  He is on disability.  REVIEW OF SYSTEMS:  Notable for those difficulties as well as sleep apnea.  No suicidal thoughts or aberrant behaviors.  His Oswestry score is 58.  PAST MEDICAL HISTORY, SOCIAL HISTORY AND FAMILY HISTORY:  Unchanged.  PHYSICAL EXAMINATION:  VITAL SIGNS:  His blood pressure is 103/50, pulse 72, respirations are 20, his O2 sat is 96 on room air.  Motor strength is not tested due to his size and being in a wheelchair. Sensation is diminished.  Constitutionally,  he is morbidly obese.  He is alert and oriented x3.  Again, he is an Passenger transport manager.  ASSESSMENT: 1. Chronic low back pain. 2. Osteoarthritis of the knees. 3. Morbid obesity. 4. Depression. 5. Bilateral carpal tunnel.  PLAN:  He is doing well on his current medicine regimen.  We will continue him.  He was given refills on MS Contin CR 15 mg three p.o. q.12 h., 180, with no refill and Vicodin 7.5/500 one p.o. q.8 hours p.r.n. 90 with no refill.  He was given prescriptions for this month and next month.  He will follow up with Dr. Riley Kill in 2 months.  His questions were encouraged and answered.     Madge Therrien L. Blima Dessert Electronically Signed    RLW/MedQ D:  08/23/2010 13:09:06  T:  08/24/2010 00:33:48  Job #:  [id number]

## 2010-10-19 ENCOUNTER — Encounter: Payer: Medicare FFS | Attending: Neurosurgery | Admitting: Neurosurgery

## 2010-10-19 DIAGNOSIS — G8929 Other chronic pain: Secondary | ICD-10-CM | POA: Insufficient documentation

## 2010-10-19 DIAGNOSIS — M1710 Unilateral primary osteoarthritis, unspecified knee: Secondary | ICD-10-CM

## 2010-10-19 DIAGNOSIS — G894 Chronic pain syndrome: Secondary | ICD-10-CM

## 2010-10-19 DIAGNOSIS — F329 Major depressive disorder, single episode, unspecified: Secondary | ICD-10-CM | POA: Insufficient documentation

## 2010-10-19 DIAGNOSIS — M545 Low back pain, unspecified: Secondary | ICD-10-CM | POA: Insufficient documentation

## 2010-10-19 DIAGNOSIS — M25549 Pain in joints of unspecified hand: Secondary | ICD-10-CM

## 2010-10-19 DIAGNOSIS — F3289 Other specified depressive episodes: Secondary | ICD-10-CM | POA: Insufficient documentation

## 2010-10-19 NOTE — Assessment & Plan Note (Signed)
This patient of Dr. Riley Kill followed for chronic low back pain, morbid obesity.  He is in an electric wheelchair most all the time for transportation.  He cannot ambulate due to pain.  He rates his pain unchanged at about 6 or 7.  It is a burning aching-type pain.  He does not indicate his activity level.  Pain is worse.  Sleep patterns are fair.  He does not indicate what helps or worsens his pain.  Mobility again he is in an electric wheelchair or walker.  He can only climb about 3 steps at a time, walk about a minute at a time.  He does drive. Functionally, he is on disability.  REVIEW OF SYSTEMS:  Notable for some bladder control problems, constipation, urinary retention, sleep apnea.  PAST MEDICAL HISTORY:  Unchanged.  SOCIAL HISTORY:  Unchanged.  FAMILY HISTORY:  Unchanged.  PHYSICAL EXAMINATION:  His blood pressure is 153/50, pulse 70, respirations 18, O2 sats 98% on room air.  His motor strength is not able to be tested due to the fact of his morbid obesity and the fact using the chair he can stand barely and very unstably.  Sensation is intact.  He is morbidly obese.  Constitutionally, again he is alert and oriented x3 and very pleasant.  ASSESSMENT: 1. Chronic low back pain. 2. Osteoarthritis in knees. 3. Morbid obesity. 4. Depression.  PLAN:  His pill counts are correct.  His urine drug screen was completed today.  No signs of aberrant behavior. 1. Refill Vicodin 7.5/500 one p.o. q.8 h. p.r.n. 90 with no refill. 2. MS Contin CR 15 mg three every 12 hours 180 with no refill. 3. His questions were encouraged and answered. 4. Dr. Riley Kill will see him back in clinic in 4 to 6 weeks.     Yao Hyppolite L. Blima Dessert Electronically Signed    RLW/MedQ D:  10/19/2010 11:51:29  T:  10/19/2010 15:20:43  Job #:  [id number]

## 2010-11-14 ENCOUNTER — Ambulatory Visit: Payer: Medicare FFS | Attending: Family Medicine | Admitting: Physical Therapy

## 2010-11-14 DIAGNOSIS — IMO0001 Reserved for inherently not codable concepts without codable children: Secondary | ICD-10-CM | POA: Insufficient documentation

## 2010-11-14 DIAGNOSIS — R262 Difficulty in walking, not elsewhere classified: Secondary | ICD-10-CM | POA: Insufficient documentation

## 2010-11-25 ENCOUNTER — Encounter: Payer: Medicare FFS | Attending: Physical Medicine & Rehabilitation | Admitting: Physical Medicine & Rehabilitation

## 2010-11-25 DIAGNOSIS — M1710 Unilateral primary osteoarthritis, unspecified knee: Secondary | ICD-10-CM | POA: Insufficient documentation

## 2010-11-25 DIAGNOSIS — M545 Low back pain, unspecified: Secondary | ICD-10-CM | POA: Insufficient documentation

## 2010-11-25 DIAGNOSIS — F329 Major depressive disorder, single episode, unspecified: Secondary | ICD-10-CM | POA: Insufficient documentation

## 2010-11-25 DIAGNOSIS — F3289 Other specified depressive episodes: Secondary | ICD-10-CM | POA: Insufficient documentation

## 2010-11-25 DIAGNOSIS — E669 Obesity, unspecified: Secondary | ICD-10-CM

## 2010-11-25 DIAGNOSIS — G8929 Other chronic pain: Secondary | ICD-10-CM | POA: Insufficient documentation

## 2010-11-25 DIAGNOSIS — M47817 Spondylosis without myelopathy or radiculopathy, lumbosacral region: Secondary | ICD-10-CM

## 2010-11-25 NOTE — Assessment & Plan Note (Signed)
Brian Richmond is back regarding his low back pain and osteoarthritis in his knees.  He has been generally stable.  He is taking MiraLax now which is working better for his bowel movements and he has them regularly.  He stopped the Senokot.  Pain is about 6-7/10.  He uses his power chair for his basic mobility needs.  He is able to walk short distances at home as needed.  REVIEW OF SYSTEMS:  Notable for the above.  Full 12-point review is in the written health and history section of the chart.  SOCIAL HISTORY:  The patient is married.  Wife is active at home and in the community still.  PHYSICAL EXAMINATION:  VITAL SIGNS:  Blood pressure 133/52, pulse is 115, respiratory rate is 20, he is satting 97% on room air. GENERAL:  The patient is pleasant, alert.  He moves all fours today.  He does have pain with flexion and extension of the knees and is limited due to pain and his body habitus.  He remains morbidly obese.  His cognition is intact. NEUROLOGIC:  Stable. HEART:  Regular rhythm but tachycardic.  ASSESSMENT: 1. Chronic low back pain. 2. Osteoarthritis of bilateral knees. 3. Morbid obesity. 4. Depression.  PLAN:  Continue with current medications.  He has been compliant with his MS Contin 45 mg q.12 hours and his Vicodin 7.5/500 one q.8 h. p.r.n. I gave prescriptions for this month and next.  We will see him back here in 2 months with my nurse practitioner.     Ranelle Oyster, M.D. Electronically Signed    ZTS/MedQ D:  11/25/2010 12:23:35  T:  11/25/2010 12:36:05  Job #:  [id number]

## 2010-12-06 ENCOUNTER — Ambulatory Visit (INDEPENDENT_AMBULATORY_CARE_PROVIDER_SITE_OTHER): Payer: Medicare FFS | Admitting: Pulmonary Disease

## 2010-12-06 ENCOUNTER — Encounter: Payer: Self-pay | Admitting: Pulmonary Disease

## 2010-12-06 VITALS — BP 124/62 | HR 79 | Temp 98.7°F | Ht 69.0 in | Wt >= 6400 oz

## 2010-12-06 DIAGNOSIS — G4733 Obstructive sleep apnea (adult) (pediatric): Secondary | ICD-10-CM

## 2010-12-06 NOTE — Assessment & Plan Note (Signed)
The patient has a long history of severe obstructive sleep apnea, and has been on CPAP since 1999.  He has done very well with CPAP, but has not been seen in this office since 2005.  His current machine is not functioning properly, and because of its age it needs to be replaced.  The patient has also noticed non- restorative sleep and worsening daytime sleepiness over the years, and therefore we will take this opportunity to optimize his pressure again with his new CPAP machine.  I will call the patient with the results of his auto titration study, and have encouraged him to work aggressively on weight loss.

## 2010-12-06 NOTE — Progress Notes (Signed)
  Subjective:    Patient ID: Brian Richmond, male    DOB: Feb 16, 1955, 55 y.o.   MRN: [id number]  HPI The patient is a 55 year old male who comes in today to reestablish for management of sleep apnea.  He was diagnosed with severe sleep apnea in 1999, where his AHI was 49 events per hour.  He was started on CPAP successfully, and has been on treatment since that time.  He was last seen in this office in 2005.  The patient comes in today where he is having increasing symptoms of sleepiness, and also his machine is over 43 years old and is not functioning properly.  He has been keeping up with mask changes, and currently uses a nasal CPAP mask.  He does not feel rested whenever he arises in the mornings, but admits that he does not usually get enough sleep.  He is having significant sleepiness during the day if he sits down for any period of time.  He states that his weight is neutral from 2 years ago.  His Epworth sleepiness score today is 19.  Sleep Questionnaire: What time do you typically go to bed?( Between what hours) 12 to 3 am How long does it take you to fall asleep? 10 to 15 mins How many times during the night do you wake up? 4 What time do you get out of bed to start your day? 1000 Do you drive or operate heavy machinery in your occupation? No How much has your weight changed (up or down) over the past two years? (In pounds) 0 oz (0 kg) Have you ever had a sleep study before? Yes If yes, location of study? [facility] If yes, date of study? 2000 Do you currently use CPAP? Yes If so, what pressure? 13 cm Do you wear oxygen at any time? No     Review of Systems  Constitutional: Negative for fever and unexpected weight change.  HENT: Positive for nosebleeds and dental problem. Negative for ear pain, congestion, sore throat, rhinorrhea, sneezing, trouble swallowing, postnasal drip and sinus pressure.   Eyes: Negative for redness and itching.  Respiratory: Negative for cough, chest tightness, shortness of  breath and wheezing.   Cardiovascular: Negative for palpitations and leg swelling.  Gastrointestinal: Negative for nausea and vomiting.  Genitourinary: Negative for dysuria.  Musculoskeletal: Negative for joint swelling.  Skin: Positive for rash.  Neurological: Negative for headaches.  Hematological: Does not bruise/bleed easily.  Psychiatric/Behavioral: Negative for dysphoric mood. The patient is not nervous/anxious.        Objective:   Physical Exam Constitutional:  Massively obese male, no acute distress  HENT:  Nares patent without discharge  Oropharynx without exudate, palate and uvula are thickened and elongated.  Eyes:  Perrla, eomi, no scleral icterus  Neck:  No JVD, no TMG  Cardiovascular:  Normal rate, regular rhythm, no rubs or gallops.  No murmurs        Decreased distal pulses  Pulmonary :  Normal breath sounds, no stridor or respiratory distress   No rales, rhonchi, or wheezing  Abdominal:  Soft, nondistended, bowel sounds present.  No tenderness noted.   Musculoskeletal:  3+ lower extremity edema noted.  Lymph Nodes:  No cervical lymphadenopathy noted  Skin:  No cyanosis noted  Neurologic:  Alert, appropriate, moves all 4 extremities without obvious deficit.         Assessment & Plan:

## 2010-12-06 NOTE — Patient Instructions (Signed)
Will get you a new cpap machine, and will set on auto setting for next 3 weeks to re-optimize your pressure.  Will call you with results.  Work on weight reduction.  followup with me in one year.

## 2010-12-12 ENCOUNTER — Encounter: Payer: Self-pay | Admitting: Pulmonary Disease

## 2011-01-23 ENCOUNTER — Telehealth: Payer: Self-pay | Admitting: Pulmonary Disease

## 2011-01-23 DIAGNOSIS — G4733 Obstructive sleep apnea (adult) (pediatric): Secondary | ICD-10-CM

## 2011-01-23 NOTE — Telephone Encounter (Signed)
I spoke with pt and he states he is ntot able to sleep after using his cpap machine. Pt states [employer] download his machine and sent over to [facility] an dis awaiting a response. Per Aundra Millet they have not received report. I called [employer] to get this fax to [place]. I gave triage fax #. Will await fax

## 2011-01-25 ENCOUNTER — Encounter: Payer: Medicare FFS | Attending: Neurosurgery | Admitting: Neurosurgery

## 2011-01-25 ENCOUNTER — Other Ambulatory Visit: Payer: Self-pay | Admitting: Pulmonary Disease

## 2011-01-25 DIAGNOSIS — M545 Low back pain, unspecified: Secondary | ICD-10-CM | POA: Insufficient documentation

## 2011-01-25 DIAGNOSIS — G4733 Obstructive sleep apnea (adult) (pediatric): Secondary | ICD-10-CM

## 2011-01-25 DIAGNOSIS — F3289 Other specified depressive episodes: Secondary | ICD-10-CM | POA: Insufficient documentation

## 2011-01-25 DIAGNOSIS — M1710 Unilateral primary osteoarthritis, unspecified knee: Secondary | ICD-10-CM

## 2011-01-25 DIAGNOSIS — G894 Chronic pain syndrome: Secondary | ICD-10-CM

## 2011-01-25 DIAGNOSIS — G8929 Other chronic pain: Secondary | ICD-10-CM | POA: Insufficient documentation

## 2011-01-25 DIAGNOSIS — F329 Major depressive disorder, single episode, unspecified: Secondary | ICD-10-CM

## 2011-01-25 NOTE — Telephone Encounter (Signed)
Called and spoke with Toni Amend from [place] and informed her i still haven't received download.  She is going to re-fax it to the triage fax # now.

## 2011-01-25 NOTE — Telephone Encounter (Signed)
Please let pt know that his optimal pressure on auto appears to be 12cm.  He also had a lot of mask leaking noted. Will send an order to his dme to set pressure on 12cm, and to work with him on mask fitting.

## 2011-01-25 NOTE — Telephone Encounter (Signed)
Brian Richmond, did you ever receive the fax on this pt's download? Please advise, thanks

## 2011-01-25 NOTE — Telephone Encounter (Signed)
Received results and put in [facility] VIP folder for him to review.

## 2011-01-25 NOTE — Telephone Encounter (Signed)
Called spoke with patient, advised of results as stated per [facility].  Pt verbalized his understanding.  Order sent to [facility].

## 2011-01-26 NOTE — Assessment & Plan Note (Signed)
This is a patient of Dr. Hermelinda Medicus seen for back pain and osteoarthritis of the knees.  He is doing quite well.  He rates his pain at about 6, sharp, aching type pain.  General activity level is 9.  The pain is worse in the morning and evening.  Sleep patterns are fair.  Pain is worse with walking and standing.  Rest, heat, medication helps.  He uses a walker or electric chair to transfer and for mobility.  He cannot climb steps.  He does drive.  He is on disability.  REVIEW OF SYSTEMS:  Notable for difficulties as described above as well as some bladder control issues, numbness, skin breakdown, constipation, sleep apnea.  No aberrant behaviors.  Last pill count and UDS is consistent.  PAST MEDICAL HISTORY:  Unchanged.  SOCIAL HISTORY:  Unchanged.  FAMILY HISTORY:  Unchanged.  PHYSICAL EXAM:  His blood pressure is 143/60, pulse 75, respirations 18, O2 saturation is 95% on room air.  His motor strength and sensation appear to be intact.  His body habitus, constitutionally he is morbidly obese.  He is alert and oriented x3.  ASSESSMENT: 1. Chronic low back pain. 2. Osteoarthritis of the knees bilaterally. 3. Morbid obesity. 4. Depression.  PLAN: 1. He got 2 prescriptions each for this month and next month, one for     Vicodin 7.5/500 one p.o. q.8 hours p.r.n., 90 with no refill. 2. MS Contin CR 15 mg 3 every 12 hours, 180 with no refills.     Questions were encouraged and answered.  We will see him back in 2     months.     Brian Richmond L. Blima Dessert Electronically Signed    RLW/MedQ D:  01/25/2011 14:54:14  T:  01/26/2011 06:17:30  Job #:  [id number]

## 2011-03-22 ENCOUNTER — Encounter: Payer: Self-pay | Admitting: Physical Medicine & Rehabilitation

## 2011-03-22 ENCOUNTER — Encounter: Payer: Medicare FFS | Attending: Neurosurgery | Admitting: Physical Medicine & Rehabilitation

## 2011-03-22 DIAGNOSIS — F329 Major depressive disorder, single episode, unspecified: Secondary | ICD-10-CM | POA: Insufficient documentation

## 2011-03-22 DIAGNOSIS — M1712 Unilateral primary osteoarthritis, left knee: Secondary | ICD-10-CM | POA: Insufficient documentation

## 2011-03-22 DIAGNOSIS — M47817 Spondylosis without myelopathy or radiculopathy, lumbosacral region: Secondary | ICD-10-CM

## 2011-03-22 DIAGNOSIS — M545 Low back pain, unspecified: Secondary | ICD-10-CM | POA: Insufficient documentation

## 2011-03-22 DIAGNOSIS — M1711 Unilateral primary osteoarthritis, right knee: Secondary | ICD-10-CM | POA: Insufficient documentation

## 2011-03-22 DIAGNOSIS — F3289 Other specified depressive episodes: Secondary | ICD-10-CM | POA: Insufficient documentation

## 2011-03-22 DIAGNOSIS — G8929 Other chronic pain: Secondary | ICD-10-CM | POA: Insufficient documentation

## 2011-03-22 DIAGNOSIS — M1710 Unilateral primary osteoarthritis, unspecified knee: Secondary | ICD-10-CM | POA: Insufficient documentation

## 2011-03-22 DIAGNOSIS — M47816 Spondylosis without myelopathy or radiculopathy, lumbar region: Secondary | ICD-10-CM | POA: Insufficient documentation

## 2011-03-22 MED ORDER — HYDROCODONE-ACETAMINOPHEN 7.5-500 MG PO TABS: 1.0000 | ORAL_TABLET | Freq: Four times a day (QID) | ORAL | Status: DC | PRN

## 2011-03-22 MED ORDER — MORPHINE SULFATE CR 30 MG PO TB12: 30.0000 mg | ORAL_TABLET | Freq: Three times a day (TID) | ORAL | Status: AC

## 2011-03-22 MED ORDER — MORPHINE SULFATE CR 15 MG PO TB12: 30.0000 mg | ORAL_TABLET | Freq: Three times a day (TID) | ORAL | Status: DC

## 2011-03-22 NOTE — Patient Instructions (Signed)
Continue your exercises  °

## 2011-03-22 NOTE — Progress Notes (Signed)
Subjective:    Patient ID: Brian Richmond, male    DOB: 1955-06-20, 56 y.o.   MRN: [id number]  HPI Brian Richmond is back regarding his chronic pain.  Pain holding steady.  He's now going to [facility].  He's there 2-3 days per week with a goal to get back to 4 days per week. Doing a lot of Upper body machines.  Unable to use treadmill or lower extremity aerobic machines obviously.  Does some walking in the pool and exercises there. His stamina has improved. He had a fall in bathroom a couple weeks ago and feels his left knee doesn't bend quite as wel.  Pain medications seem to be holding him steady.    Mood has been fair. He occasionally has depression.  He stopped taking his antidepressant about 2 months.  Wife still travels occasionally.   He's on metamucil for constipation. He's having problems with urinary incontinence/urgency.    Pain Inventory Average Pain 8 Pain Right Now 7 My pain is constant, tingling and aching  In the last 24 hours, has pain interfered with the following? General activity 8 Relation with others 9 Enjoyment of life 9 What TIME of day is your pain at its worst? morning Sleep (in general) Fair  Pain is worse with: walking, standing and some activites Pain improves with: rest and medication Relief from Meds: 6  Mobility use a walker how many minutes can you walk? 0 ability to climb steps?  yes do you drive?  yes use a wheelchair transfers alone  Function disabled: date disabled   Neuro/Psych bladder control problems numbness tingling  Prior Studies Any changes since last visit?  no  Physicians involved in your care Any changes since last visit?  no      Review of Systems  Constitutional: Negative.   HENT: Negative.   Eyes: Negative.   Respiratory: Positive for apnea.   Gastrointestinal: Positive for constipation.  Genitourinary: Positive for difficulty urinating.  Skin: Positive for rash.  Neurological: Negative.   Hematological: Negative.    Psychiatric/Behavioral: Negative.        Objective:   Physical Exam  Constitutional: He is oriented to person, place, and time. He appears well-developed.       Morbidly obese  HENT:  Head: Normocephalic.  Eyes: EOM are normal. Pupils are equal, round, and reactive to light.  Neck: Normal range of motion.  Cardiovascular: Normal rate.   Pulmonary/Chest: Effort normal.  Abdominal: Soft.  Musculoskeletal: Normal range of motion.       Right knee: He exhibits swelling. tenderness found.       Left knee: He exhibits swelling. tenderness found.       Lumbar back: He exhibits tenderness and pain.       Uses powered chair for mobility. Limited ambulation and standing due to his size and pain. Left knee is a little more tender along the lateral joint line.  Back ROM is limited.  Neurological: He is alert and oriented to person, place, and time.  Psychiatric: He has a normal mood and affect. His speech is normal and behavior is normal.          Assessment & Plan:  ASSESSMENT:  1. Chronic low back pain.  2. Osteoarthritis of the knees bilaterally.  3. Morbid obesity.  4. Depression.  PLAN:  1. He got 2 prescriptions each for this month and next month, one for  Vicodin 7.5/500 one p.o. q.8 hours p.r.n., 90 with no refill.  2. MS Contin  CR 15 mg 3 every 12 hours, 180 with no refills.  Questions were encouraged and answered. We will see him back in 2  Months. 3. Cautioned him to be on the lookout for worsening depression given that he stopped his antidepressant.  He stated that he would preserve.  4. Metamucil for bowels. 5. F/u with RN in 2 months.

## 2011-04-18 ENCOUNTER — Encounter: Payer: Self-pay | Admitting: Physical Medicine & Rehabilitation

## 2011-05-15 ENCOUNTER — Telehealth: Payer: Self-pay | Admitting: Physical Medicine & Rehabilitation

## 2011-05-15 MED ORDER — MS CONTIN 30 MG PO TBCR: 1.0000 | EXTENDED_RELEASE_TABLET | Freq: Three times a day (TID) | ORAL | Status: DC

## 2011-05-15 NOTE — Telephone Encounter (Signed)
This appears to be corrected?

## 2011-05-15 NOTE — Telephone Encounter (Signed)
Rx for MS Contin written incorrectly.  Please call

## 2011-05-18 ENCOUNTER — Telehealth: Payer: Self-pay | Admitting: Pulmonary Disease

## 2011-05-18 NOTE — Telephone Encounter (Signed)
[email or web address] for [email or web address] 

## 2011-05-18 NOTE — Telephone Encounter (Signed)
That sounds like a DME problem.  Why didn't they submit a download when the submitted their bill?  I am sure they had no issues submitting a bill!!  Send to [facility], but if not making  Progress Budd Palmer needs to be involved.

## 2011-05-18 NOTE — Telephone Encounter (Signed)
Spoke with Rosey Bath. She states that extension on pt's CPAP was denied because the CPAP compliance download was never submitted. She is asking if [facility] wants to call her for peer to peer. Please advise, thanks

## 2011-05-18 NOTE — Telephone Encounter (Signed)
Ok, will forward to [facility]

## 2011-05-19 NOTE — Telephone Encounter (Signed)
Spoke to teresa she will notify [employer] they need to do compliance download in order for pt to qualify for cpap Brian Richmond

## 2011-05-22 ENCOUNTER — Encounter: Payer: Medicare FFS | Attending: Neurosurgery | Admitting: Physical Medicine & Rehabilitation

## 2011-05-22 ENCOUNTER — Encounter: Payer: Self-pay | Admitting: Physical Medicine & Rehabilitation

## 2011-05-22 VITALS — BP 151/77 | HR 73 | Ht 69.0 in | Wt >= 6400 oz

## 2011-05-22 DIAGNOSIS — M1710 Unilateral primary osteoarthritis, unspecified knee: Secondary | ICD-10-CM | POA: Insufficient documentation

## 2011-05-22 DIAGNOSIS — M1712 Unilateral primary osteoarthritis, left knee: Secondary | ICD-10-CM

## 2011-05-22 DIAGNOSIS — M1711 Unilateral primary osteoarthritis, right knee: Secondary | ICD-10-CM

## 2011-05-22 DIAGNOSIS — F32A Depression, unspecified: Secondary | ICD-10-CM | POA: Insufficient documentation

## 2011-05-22 DIAGNOSIS — M47817 Spondylosis without myelopathy or radiculopathy, lumbosacral region: Secondary | ICD-10-CM | POA: Insufficient documentation

## 2011-05-22 DIAGNOSIS — IMO0002 Reserved for concepts with insufficient information to code with codable children: Secondary | ICD-10-CM | POA: Insufficient documentation

## 2011-05-22 DIAGNOSIS — M47816 Spondylosis without myelopathy or radiculopathy, lumbar region: Secondary | ICD-10-CM

## 2011-05-22 DIAGNOSIS — F329 Major depressive disorder, single episode, unspecified: Secondary | ICD-10-CM

## 2011-05-22 DIAGNOSIS — F3289 Other specified depressive episodes: Secondary | ICD-10-CM | POA: Insufficient documentation

## 2011-05-22 MED ORDER — MORPHINE SULFATE ER 30 MG PO TBCR: 30.0000 mg | EXTENDED_RELEASE_TABLET | Freq: Three times a day (TID) | ORAL | Status: DC

## 2011-05-22 MED ORDER — CITALOPRAM HYDROBROMIDE 20 MG PO TABS: 20.0000 mg | ORAL_TABLET | Freq: Every day | ORAL | Status: DC

## 2011-05-22 MED ORDER — HYDROCODONE-ACETAMINOPHEN 7.5-500 MG PO TABS: 1.0000 | ORAL_TABLET | Freq: Four times a day (QID) | ORAL | Status: DC | PRN

## 2011-05-22 NOTE — Progress Notes (Signed)
Addended by: Judd Gaudier on: 05/22/2011 02:49 PM   Modules accepted: Orders

## 2011-05-22 NOTE — Patient Instructions (Signed)
Maintain physical activity as tolerated

## 2011-05-22 NOTE — Progress Notes (Signed)
Subjective:    Patient ID: Brian Richmond, male    DOB: 1955-02-10, 56 y.o.   MRN: [id number]  HPI  Khyan is back regarding his chronic pain. He's seeing urology regarding his urinary incontinence. He has an enlarged prostate. He has a urodynamic study planned.  He has noticed a slight increase in his pain lately.  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 He has been more achy since then also. The cool weather affects his pain adversely as well.    He stopped taking his antidepressant 4 or 5 months ago, and he feels he's more depressed again. His pain certainly has a negative impact on his mood.   He remains on the morphine and finds it helps.  If he misses a dose, he really can "feel it".  The hydrocodone helps as well.   He had a recent sinus infection which required abx.  Pain Inventory Average Pain 7 Pain Right Now 8 My pain is constant and aching  In the last 24 hours, has pain interfered with the following? General activity 10 Relation with others 10 Enjoyment of life 10 What TIME of day is your pain at its worst? morning and night Sleep (in general) Fair  Pain is worse with: some activites Pain improves with: rest and medication Relief from Meds: 6  Mobility use a walker how many minutes can you walk? 0 ability to climb steps?  yes do you drive?  yes use a wheelchair  Function disabled: date disabled  I need assistance with the following:  bathing  Neuro/Psych bladder control problems numbness  Prior Studies Any changes since last visit?  no  Physicians involved in your care Primary care K Little        Review of Systems  Constitutional: Negative.   HENT: Negative.   Eyes: Negative.   Respiratory: Positive for apnea.   Cardiovascular: Negative.   Gastrointestinal: Positive for constipation.  Genitourinary: Negative.   Musculoskeletal: Negative.   Skin: Negative.     Neurological: Negative.   Hematological: Negative.   Psychiatric/Behavioral: Negative.        Objective:   Physical Exam  Constitutional: He is oriented to person, place, and time. He appears well-developed and well-nourished.       Morbidly obese  HENT:  Head: Normocephalic and atraumatic.  Mouth/Throat: Oropharynx is clear and moist.  Eyes: Conjunctivae and EOM are normal. Pupils are equal, round, and reactive to light.  Neck: Normal range of motion.  Cardiovascular: Normal rate and regular rhythm.   Pulmonary/Chest: Effort normal and breath sounds normal. No respiratory distress. He has no wheezes.  Abdominal: Soft. He exhibits no distension.  Musculoskeletal:       Right knee: He exhibits decreased range of motion and swelling. tenderness found.       Left knee: He exhibits decreased range of motion and swelling. tenderness found.       Lumbar back: He exhibits decreased range of motion and tenderness.  Neurological: He is alert and oriented to person, place, and time.  Psychiatric: His behavior is normal. Judgment and thought content normal.       Affect flat today          Assessment & Plan:  ASSESSMENT:  1. Chronic low back pain.  2. Osteoarthritis of the knees bilaterally.  3. Morbid obesity.  4. Depression.  PLAN:  1. He got 2 prescriptions each  for this month and next month, one for  Vicodin 7.5/500 one p.o. q.8 hours p.r.n., 90 with no refill.  2. MS Contin CR 15 mg 3 every 12 hours, 180 with no refills.  Questions were encouraged and answered. My PA will see him in 2 months 3. Need to resume his celexa given the down turn in his mood. I described to him the negative impaact this has on his pain as well.  4. Metamucil for bowels. This has been effective 5. F/u with PA  in 2 months  All questions were encouraged and answered

## 2011-05-31 ENCOUNTER — Telehealth: Payer: Self-pay | Admitting: Pulmonary Disease

## 2011-05-31 NOTE — Telephone Encounter (Signed)
Error.Brian Richmond ° °

## 2011-06-08 DIAGNOSIS — C189 Malignant neoplasm of colon, unspecified: Secondary | ICD-10-CM | POA: Insufficient documentation

## 2011-06-27 ENCOUNTER — Telehealth: Payer: Self-pay | Admitting: Pulmonary Disease

## 2011-06-27 NOTE — Telephone Encounter (Signed)
Called, spoke with Brian Richmond with Brian Richmond who states they have an authorization for pt's cpap for the 1st 3 months but needs authorization for a full 13 months.  States this information is for [employer] but MUST COME FROM PT'S PCP.  I advised Brian Richmond is not pt's PCP but he is his sleep doctor who does order and manage pt's cpap.  Per [employer], this will not work -- this information MUST come from pt's PCP for [employer] to accept it.  I once again advised Brian Richmond does manage pt's cpap but [facility] stated this did not matter.  The information needed must come from PCP for [facility] to accept it.  She states nothing further is needed and states she will contact pt regarding this.

## 2011-07-24 ENCOUNTER — Encounter: Payer: Medicare FFS | Admitting: Physical Medicine and Rehabilitation

## 2011-08-07 ENCOUNTER — Encounter: Payer: Medicare FFS | Attending: Physical Medicine & Rehabilitation | Admitting: Physical Medicine and Rehabilitation

## 2011-08-07 ENCOUNTER — Encounter: Payer: Self-pay | Admitting: Physical Medicine and Rehabilitation

## 2011-08-07 VITALS — BP 180/74 | HR 76 | Resp 18 | Ht 69.0 in | Wt >= 6400 oz

## 2011-08-07 DIAGNOSIS — G4733 Obstructive sleep apnea (adult) (pediatric): Secondary | ICD-10-CM | POA: Insufficient documentation

## 2011-08-07 DIAGNOSIS — Z85038 Personal history of other malignant neoplasm of large intestine: Secondary | ICD-10-CM | POA: Insufficient documentation

## 2011-08-07 DIAGNOSIS — M170 Bilateral primary osteoarthritis of knee: Secondary | ICD-10-CM

## 2011-08-07 DIAGNOSIS — M545 Low back pain, unspecified: Secondary | ICD-10-CM | POA: Insufficient documentation

## 2011-08-07 DIAGNOSIS — F329 Major depressive disorder, single episode, unspecified: Secondary | ICD-10-CM | POA: Insufficient documentation

## 2011-08-07 DIAGNOSIS — G8929 Other chronic pain: Secondary | ICD-10-CM | POA: Insufficient documentation

## 2011-08-07 DIAGNOSIS — M1710 Unilateral primary osteoarthritis, unspecified knee: Secondary | ICD-10-CM | POA: Insufficient documentation

## 2011-08-07 DIAGNOSIS — R209 Unspecified disturbances of skin sensation: Secondary | ICD-10-CM | POA: Insufficient documentation

## 2011-08-07 DIAGNOSIS — M47816 Spondylosis without myelopathy or radiculopathy, lumbar region: Secondary | ICD-10-CM

## 2011-08-07 DIAGNOSIS — M1712 Unilateral primary osteoarthritis, left knee: Secondary | ICD-10-CM

## 2011-08-07 DIAGNOSIS — F3289 Other specified depressive episodes: Secondary | ICD-10-CM | POA: Insufficient documentation

## 2011-08-07 DIAGNOSIS — M47817 Spondylosis without myelopathy or radiculopathy, lumbosacral region: Secondary | ICD-10-CM

## 2011-08-07 DIAGNOSIS — M1711 Unilateral primary osteoarthritis, right knee: Secondary | ICD-10-CM

## 2011-08-07 MED ORDER — MORPHINE SULFATE ER 30 MG PO TBCR: 30.0000 mg | EXTENDED_RELEASE_TABLET | Freq: Three times a day (TID) | ORAL | Status: DC

## 2011-08-07 MED ORDER — HYDROCODONE-ACETAMINOPHEN 7.5-500 MG PO TABS: 1.0000 | ORAL_TABLET | Freq: Four times a day (QID) | ORAL | Status: DC | PRN

## 2011-08-07 NOTE — Patient Instructions (Signed)
Continue with going into the pool in your [employer]. Continue with trying to loose weight.Try to substitute sugar with [place] Brown.

## 2011-08-07 NOTE — Progress Notes (Signed)
Subjective:    Patient ID: Brian Richmond, male    DOB: 04-14-1955, 56 y.o.   MRN: [id number]  HPI The patient complains about chronic pain which radiates into his posterior hips bilateral.  The patient also complains about numbness and tingling in hands and feet. The problem has been stable. Pain Inventory Average Pain 7 Pain Right Now 7 My pain is constant, sharp and aching  In the last 24 hours, has pain interfered with the following? General activity 9 Relation with others 9 Enjoyment of life 9 What TIME of day is your pain at its worst? night Sleep (in general) Fair  Pain is worse with: walking and standing Pain improves with: rest and medication Relief from Meds: 7  Mobility use a walker do you drive?  yes use a wheelchair  Function disabled: date disabled   Neuro/Psych bladder control problems numbness  Prior Studies Any changes since last visit?  no  Physicians involved in your care Any changes since last visit?  no   Family History  Problem Relation Age of Onset  . Ovarian cancer Mother   . Cancer Mother   . Heart disease Mother   . Arthritis Father    History   Social History  . Marital Status: Married    Spouse Name: N/A    Number of Children: y  . Years of Education: N/A   Occupational History  . DISABLED    Social History Main Topics  . Smoking status: Never Smoker   . Smokeless tobacco: None  . Alcohol Use: None  . Drug Use: None  . Sexually Active: None   Other Topics Concern  . None   Social History Narrative   Pt is married and lives with wife.     Past Surgical History  Procedure Date  . Colon surgery 03/2008   Past Medical History  Diagnosis Date  . Colon cancer 03/2008  . OSA (obstructive sleep apnea)   . Neuropathy   . Obesity   . Carpal tunnel syndrome    BP 180/74  Pulse 76  Resp 18  Ht 5\' 9"  (1.753 m)  Wt 501 lb (227.252 kg)  BMI 73.98 kg/m2  SpO2 92%      Review of Systems  Constitutional:  Negative.   HENT: Negative.   Eyes: Negative.   Respiratory: Positive for apnea.   Cardiovascular: Negative.   Gastrointestinal: Positive for constipation.  Genitourinary: Positive for difficulty urinating.  Musculoskeletal: Positive for back pain.  Skin: Positive for rash.  Neurological: Positive for numbness.  Hematological: Negative.   Psychiatric/Behavioral: Negative.        Objective:   Physical Exam Constitutional: He is oriented to person, place, and time. He appears well-developed and well-nourished.  Morbidly obese in wheelchair  HENT:  Head: Normocephalic and atraumatic.  Mouth/Throat: Oropharynx is clear and moist.  Eyes: Conjunctivae and EOM are normal. Pupils are equal, round, and reactive to light.  Neck: Normal range of motion.  Cardiovascular: Normal rate and regular rhythm.  Pulmonary/Chest: Effort normal and breath sounds normal. No respiratory distress. He has no wheezes.  Abdominal: Soft. He exhibits no distension.  Musculoskeletal:  Right knee: He exhibits decreased range of motion and swelling. tenderness found.  Left knee: He exhibits decreased range of motion and swelling. tenderness found.  Lumbar back: He exhibits decreased range of motion and tenderness.  Neurological: He is alert and oriented to person, place, and time.  Psychiatric: His behavior is normal. Judgment and thought content normal.  Assessment & Plan:  1. Chronic low back pain.  2. Osteoarthritis of the knees bilaterally.  3. Morbid obesity.  4. Depression.  PLAN:  1. He got 2 prescriptions each for this month and next month, one for  Vicodin 7.5/500 one p.o. q.8 hours p.r.n., 90 with no refill.  2. MS Contin CR 15 mg 3 every 12 hours, 180 with no refills.  Questions were encouraged and answered.   3. Has resumed his celexa, his mood is fine today.  4.  F/u with PA in 2 months

## 2011-10-04 ENCOUNTER — Encounter: Payer: Self-pay | Admitting: Physical Medicine and Rehabilitation

## 2011-10-04 ENCOUNTER — Encounter: Payer: Medicare FFS | Attending: Physical Medicine and Rehabilitation | Admitting: Physical Medicine and Rehabilitation

## 2011-10-04 VITALS — BP 163/71 | HR 94 | Resp 16 | Ht 69.0 in | Wt >= 6400 oz

## 2011-10-04 DIAGNOSIS — M47816 Spondylosis without myelopathy or radiculopathy, lumbar region: Secondary | ICD-10-CM

## 2011-10-04 DIAGNOSIS — M25569 Pain in unspecified knee: Secondary | ICD-10-CM | POA: Insufficient documentation

## 2011-10-04 DIAGNOSIS — M47817 Spondylosis without myelopathy or radiculopathy, lumbosacral region: Secondary | ICD-10-CM

## 2011-10-04 DIAGNOSIS — M1712 Unilateral primary osteoarthritis, left knee: Secondary | ICD-10-CM

## 2011-10-04 DIAGNOSIS — G8929 Other chronic pain: Secondary | ICD-10-CM | POA: Insufficient documentation

## 2011-10-04 DIAGNOSIS — M1711 Unilateral primary osteoarthritis, right knee: Secondary | ICD-10-CM

## 2011-10-04 DIAGNOSIS — M545 Low back pain, unspecified: Secondary | ICD-10-CM | POA: Insufficient documentation

## 2011-10-04 DIAGNOSIS — R209 Unspecified disturbances of skin sensation: Secondary | ICD-10-CM | POA: Insufficient documentation

## 2011-10-04 DIAGNOSIS — M1710 Unilateral primary osteoarthritis, unspecified knee: Secondary | ICD-10-CM | POA: Insufficient documentation

## 2011-10-04 DIAGNOSIS — Z85038 Personal history of other malignant neoplasm of large intestine: Secondary | ICD-10-CM | POA: Insufficient documentation

## 2011-10-04 DIAGNOSIS — F3289 Other specified depressive episodes: Secondary | ICD-10-CM | POA: Insufficient documentation

## 2011-10-04 DIAGNOSIS — F329 Major depressive disorder, single episode, unspecified: Secondary | ICD-10-CM | POA: Insufficient documentation

## 2011-10-04 DIAGNOSIS — G4733 Obstructive sleep apnea (adult) (pediatric): Secondary | ICD-10-CM | POA: Insufficient documentation

## 2011-10-04 MED ORDER — MORPHINE SULFATE ER 30 MG PO TBCR: 30.0000 mg | EXTENDED_RELEASE_TABLET | Freq: Three times a day (TID) | ORAL | Status: DC

## 2011-10-04 MED ORDER — HYDROCODONE-ACETAMINOPHEN 7.5-500 MG PO TABS: 1.0000 | ORAL_TABLET | Freq: Four times a day (QID) | ORAL | Status: DC | PRN

## 2011-10-04 NOTE — Progress Notes (Signed)
Subjective:    Patient ID: Brian Richmond, male    DOB: 05-21-1955, 56 y.o.   MRN: [id number]  HPI The patient complains about chronic LBP which radiates into his posterior hips bilateral. The patient also complains about numbness and tingling in hands and feet. The patient also complains about bilateral knee pain, he states, that he needs to loose about 250 lbs before he could have a TKR. 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 The problem has been stable.  Pain Inventory Average Pain 7 Pain Right Now 7 My pain is dull, tingling and aching  In the last 24 hours, has pain interfered with the following? General activity 9 Relation with others 9 Enjoyment of life 9 What TIME of day is your pain at its worst? varies Sleep (in general) Fair  Pain is worse with: some activites Pain improves with: pacing activities and medication Relief from Meds: 7  Mobility walk with assistance use a walker ability to climb steps?  yes do you drive?  yes use a wheelchair  Function disabled: date disabled   Neuro/Psych bladder control problems numbness tingling  Prior Studies Any changes since last visit?  no  Physicians involved in your care Any changes since last visit?  no   Family History  Problem Relation Age of Onset  . Ovarian cancer Mother   . Cancer Mother   . Heart disease Mother   . Arthritis Father    History   Social History  . Marital Status: Married    Spouse Name: N/A    Number of Children: y  . Years of Education: N/A   Occupational History  . DISABLED    Social History Main Topics  . Smoking status: Never Smoker   . Smokeless tobacco: None  . Alcohol Use: None  . Drug Use: None  . Sexually Active: None   Other Topics Concern  . None   Social History Narrative   Pt is married and lives with wife.     Past Surgical History  Procedure Date  . Colon surgery 03/2008   Past Medical History  Diagnosis Date  .  Colon cancer 03/2008  . OSA (obstructive sleep apnea)   . Neuropathy   . Obesity   . Carpal tunnel syndrome    BP 163/71  Pulse 94  Resp 16  Ht 5\' 9"  (1.753 m)  Wt 501 lb (227.252 kg)  BMI 73.98 kg/m2  SpO2 94%     Review of Systems  Respiratory: Positive for apnea.   Gastrointestinal: Positive for constipation.  Musculoskeletal: Positive for myalgias, back pain and arthralgias.  Neurological: Positive for numbness.  All other systems reviewed and are negative.       Objective:   Physical Exam Constitutional: He is oriented to person, place, and time. He appears well-developed and well-nourished.  Morbidly obese in wheelchair  HENT:  Head: Normocephalic and atraumatic.  Mouth/Throat: Oropharynx is clear and moist.  Eyes: Conjunctivae and EOM are normal. Pupils are equal, round, and reactive to light.  Neck: Normal range of motion.  Cardiovascular: Normal rate and regular rhythm.  Pulmonary/Chest: Effort normal and breath sounds normal. No respiratory distress. He has no wheezes.  Abdominal: Soft. He exhibits no distension.  Musculoskeletal:  Muscle strength grossly intact. Right knee: He exhibits decreased range of motion and swelling. tenderness found.  Left knee: He exhibits decreased range of motion and swelling. tenderness found.  Lumbar back: He exhibits  decreased range of motion and tenderness.  Neurological: He is alert and oriented to person, place, and time.  Psychiatric: His behavior is normal. Judgment and thought content normal.          Assessment & Plan:  1. Chronic low back pain.  2. Osteoarthritis of the knees bilaterally.  3. Morbid obesity.  4. Depression.  PLAN:  1. He got 2 prescriptions each for this month and next month, one for  Vicodin 7.5/500 one p.o. q.8 hours p.r.n., 90 with no refill.  2. MS Contin CR 15 mg 3 every 12 hours, 180 with no refills.  Questions were encouraged and answered.  3. Has resumed his celexa, his mood is  fine today.  Encouraged patient to continue with his workout program. 4. F/u with PA in 2 months

## 2011-10-04 NOTE — Patient Instructions (Signed)
Continue with working out in the gym and in the pool.

## 2011-10-28 ENCOUNTER — Other Ambulatory Visit: Payer: Self-pay | Admitting: Physical Medicine & Rehabilitation

## 2011-12-01 ENCOUNTER — Encounter: Payer: Medicare FFS | Attending: Physical Medicine and Rehabilitation | Admitting: Physical Medicine and Rehabilitation

## 2011-12-01 ENCOUNTER — Encounter: Payer: Self-pay | Admitting: Physical Medicine and Rehabilitation

## 2011-12-01 VITALS — BP 139/65 | HR 74 | Resp 14 | Ht 69.0 in | Wt >= 6400 oz

## 2011-12-01 DIAGNOSIS — M25569 Pain in unspecified knee: Secondary | ICD-10-CM | POA: Insufficient documentation

## 2011-12-01 DIAGNOSIS — Z85038 Personal history of other malignant neoplasm of large intestine: Secondary | ICD-10-CM | POA: Insufficient documentation

## 2011-12-01 DIAGNOSIS — IMO0002 Reserved for concepts with insufficient information to code with codable children: Secondary | ICD-10-CM

## 2011-12-01 DIAGNOSIS — M170 Bilateral primary osteoarthritis of knee: Secondary | ICD-10-CM

## 2011-12-01 DIAGNOSIS — M1710 Unilateral primary osteoarthritis, unspecified knee: Secondary | ICD-10-CM | POA: Insufficient documentation

## 2011-12-01 DIAGNOSIS — G8929 Other chronic pain: Secondary | ICD-10-CM | POA: Insufficient documentation

## 2011-12-01 DIAGNOSIS — F3289 Other specified depressive episodes: Secondary | ICD-10-CM | POA: Insufficient documentation

## 2011-12-01 DIAGNOSIS — F329 Major depressive disorder, single episode, unspecified: Secondary | ICD-10-CM | POA: Insufficient documentation

## 2011-12-01 DIAGNOSIS — R209 Unspecified disturbances of skin sensation: Secondary | ICD-10-CM | POA: Insufficient documentation

## 2011-12-01 DIAGNOSIS — Z6841 Body Mass Index (BMI) 40.0 and over, adult: Secondary | ICD-10-CM | POA: Insufficient documentation

## 2011-12-01 DIAGNOSIS — Z5181 Encounter for therapeutic drug level monitoring: Secondary | ICD-10-CM

## 2011-12-01 DIAGNOSIS — M1712 Unilateral primary osteoarthritis, left knee: Secondary | ICD-10-CM

## 2011-12-01 DIAGNOSIS — M549 Dorsalgia, unspecified: Secondary | ICD-10-CM

## 2011-12-01 DIAGNOSIS — M545 Low back pain, unspecified: Secondary | ICD-10-CM | POA: Insufficient documentation

## 2011-12-01 DIAGNOSIS — M1711 Unilateral primary osteoarthritis, right knee: Secondary | ICD-10-CM

## 2011-12-01 DIAGNOSIS — G4733 Obstructive sleep apnea (adult) (pediatric): Secondary | ICD-10-CM | POA: Insufficient documentation

## 2011-12-01 DIAGNOSIS — M47817 Spondylosis without myelopathy or radiculopathy, lumbosacral region: Secondary | ICD-10-CM

## 2011-12-01 DIAGNOSIS — M47816 Spondylosis without myelopathy or radiculopathy, lumbar region: Secondary | ICD-10-CM

## 2011-12-01 MED ORDER — MORPHINE SULFATE ER 30 MG PO TBCR: 30.0000 mg | EXTENDED_RELEASE_TABLET | Freq: Three times a day (TID) | ORAL | Status: DC

## 2011-12-01 MED ORDER — HYDROCODONE-ACETAMINOPHEN 7.5-500 MG PO TABS: 1.0000 | ORAL_TABLET | Freq: Four times a day (QID) | ORAL | Status: DC | PRN

## 2011-12-01 NOTE — Patient Instructions (Signed)
Continue with exercising in the pool and in the gym

## 2011-12-01 NOTE — Progress Notes (Signed)
Subjective:    Patient ID: Brian Richmond, male    DOB: Apr 11, 1955, 56 y.o.   MRN: [id number]  HPI The patient complains about chronic LBP which radiates into his posterior hips bilateral. The patient also complains about numbness and tingling in hands and feet. The patient also complains about bilateral knee pain, he states, that he needs to loose about 250 lbs before he could have a TKR. 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  The problem has been stable.  Pain Inventory Average Pain 7 Pain Right Now 7 My pain is constant, sharp and aching  In the last 24 hours, has pain interfered with the following? General activity 9 Relation with others 9 Enjoyment of life 9 What TIME of day is your pain at its worst? morning and night Sleep (in general) Fair  Pain is worse with: walking and standing Pain improves with: rest and medication Relief from Meds: 5  Mobility use a walker how many minutes can you walk? 0 ability to climb steps?  yes do you drive?  yes use a wheelchair  Function disabled: date disabled   Neuro/Psych bladder control problems numbness tingling depression  Prior Studies Any changes since last visit?  no  Physicians involved in your care Any changes since last visit?  no   Family History  Problem Relation Age of Onset  . Ovarian cancer Mother   . Cancer Mother   . Heart disease Mother   . Arthritis Father    History   Social History  . Marital Status: Married    Spouse Name: N/A    Number of Children: y  . Years of Education: N/A   Occupational History  . DISABLED    Social History Main Topics  . Smoking status: Never Smoker   . Smokeless tobacco: None  . Alcohol Use: None  . Drug Use: None  . Sexually Active: None   Other Topics Concern  . None   Social History Narrative   Pt is married and lives with wife.     Past Surgical History  Procedure Date  . Colon surgery 03/2008   Past Medical  History  Diagnosis Date  . Colon cancer 03/2008  . OSA (obstructive sleep apnea)   . Neuropathy   . Obesity   . Carpal tunnel syndrome    BP 180/64  Pulse 74  Resp 14  Ht 5\' 9"  (1.753 m)  Wt 496 lb (224.984 kg)  BMI 73.25 kg/m2  SpO2 94%    Review of Systems  Respiratory: Positive for apnea.   Gastrointestinal: Positive for constipation.  Musculoskeletal: Positive for back pain.  Skin: Positive for rash.  Neurological: Positive for numbness.       Tingling  Psychiatric/Behavioral: Positive for dysphoric mood.  All other systems reviewed and are negative.       Objective:   Physical Exam Constitutional: He is oriented to person, place, and time. He appears well-developed and well-nourished.  Morbidly obese in wheelchair  HENT:  Head: Normocephalic and atraumatic.  Mouth/Throat: Oropharynx is clear and moist.  Eyes: Conjunctivae and EOM are normal. Pupils are equal, round, and reactive to light.  Neck: Normal range of motion.  Cardiovascular: Normal rate and regular rhythm.  Pulmonary/Chest: Effort normal and breath sounds normal. No respiratory distress. He has no wheezes.  Abdominal: Soft. He exhibits no distension.  Musculoskeletal:  Muscle strength grossly intact. Right knee: He exhibits decreased range of motion  and swelling. tenderness found.  Left knee: He exhibits decreased range of motion and swelling. tenderness found.  Lumbar back: He exhibits decreased range of motion and tenderness.  Neurological: He is alert and oriented to person, place, and time.  Psychiatric: His behavior is normal. Judgment and thought content normal.           Assessment & Plan:  1. Chronic low back pain.  2. Osteoarthritis of the knees bilaterally.  3. Morbid obesity.  4. Depression.  PLAN:  1. He got 2 prescriptions each for this month and next month, one for  Vicodin 7.5/500 one p.o. q.8 hours p.r.n., 90 with no refill.  2. MS Contin CR 15 mg 3 every 12 hours, 180  with no refills.  Questions were encouraged and answered.  3. Has resumed his celexa, his mood is fine today.  Encouraged patient to continue with his workout program.  4. F/u with PA in 2 months

## 2011-12-05 ENCOUNTER — Encounter: Payer: Self-pay | Admitting: Pulmonary Disease

## 2011-12-05 ENCOUNTER — Ambulatory Visit (INDEPENDENT_AMBULATORY_CARE_PROVIDER_SITE_OTHER): Payer: Medicare FFS | Admitting: Pulmonary Disease

## 2011-12-05 VITALS — BP 134/74 | HR 97 | Temp 98.7°F | Ht 69.0 in | Wt >= 6400 oz

## 2011-12-05 DIAGNOSIS — G4733 Obstructive sleep apnea (adult) (pediatric): Secondary | ICD-10-CM

## 2011-12-05 NOTE — Patient Instructions (Addendum)
Stay on cpap, and keep up with mask changes and supplies. Work on weight loss followup with me in one year 

## 2011-12-05 NOTE — Progress Notes (Signed)
  Subjective:    Patient ID: Brian Richmond, male    DOB: Dec 03, 1955, 56 y.o.   MRN: [id number]  HPI Patient comes in today for followup of his known obstructive sleep apnea.  He has been wearing CPAP compliantly, and feels that he is sleeping well with adequate daytime alertness.  He has no issues with his mask fit or pressure.   Review of Systems  Constitutional: Negative for fever and unexpected weight change.  HENT: Negative for ear pain, nosebleeds, congestion, sore throat, rhinorrhea, sneezing, trouble swallowing, dental problem, postnasal drip and sinus pressure.   Eyes: Negative for redness and itching.  Respiratory: Negative for cough, chest tightness, shortness of breath and wheezing.   Cardiovascular: Negative for palpitations and leg swelling.  Gastrointestinal: Negative for nausea and vomiting.  Genitourinary: Negative for dysuria.  Musculoskeletal: Negative for joint swelling.  Skin: Negative for rash.  Neurological: Negative for headaches.  Hematological: Does not bruise/bleed easily.  Psychiatric/Behavioral: Negative for dysphoric mood. The patient is not nervous/anxious.        Objective:   Physical Exam Morbidly obese male in no acute distress Nose without purulence or discharge noted No skin breakdown or pressure necrosis from the CPAP mask Lower extremities with significant edema, cyanosis Alert and oriented, moves all 4 extremities, does not appear to be sleepy.       Assessment & Plan:

## 2011-12-05 NOTE — Assessment & Plan Note (Signed)
The patient is doing very well on CPAP at his optimal pressure of 12 cm.  He feels that he is sleeping well with the device, and has no issues with mask fit or pressure.  I've asked him to stay on CPAP, and work aggressively on weight loss.  He will followup with me in one year or sooner if having issues.

## 2012-01-30 ENCOUNTER — Encounter: Payer: Medicare FFS | Admitting: Physical Medicine and Rehabilitation

## 2012-02-12 ENCOUNTER — Encounter: Payer: Medicare FFS | Admitting: Physical Medicine and Rehabilitation

## 2012-02-19 ENCOUNTER — Encounter: Payer: Medicare FFS | Attending: Physical Medicine and Rehabilitation | Admitting: Physical Medicine and Rehabilitation

## 2012-02-19 ENCOUNTER — Encounter: Payer: Self-pay | Admitting: Physical Medicine and Rehabilitation

## 2012-02-19 VITALS — BP 146/64 | HR 72 | Resp 16 | Ht 69.0 in | Wt >= 6400 oz

## 2012-02-19 DIAGNOSIS — M1710 Unilateral primary osteoarthritis, unspecified knee: Secondary | ICD-10-CM | POA: Insufficient documentation

## 2012-02-19 DIAGNOSIS — M25559 Pain in unspecified hip: Secondary | ICD-10-CM | POA: Insufficient documentation

## 2012-02-19 DIAGNOSIS — M1711 Unilateral primary osteoarthritis, right knee: Secondary | ICD-10-CM

## 2012-02-19 DIAGNOSIS — M545 Low back pain, unspecified: Secondary | ICD-10-CM | POA: Insufficient documentation

## 2012-02-19 DIAGNOSIS — F3289 Other specified depressive episodes: Secondary | ICD-10-CM | POA: Insufficient documentation

## 2012-02-19 DIAGNOSIS — M170 Bilateral primary osteoarthritis of knee: Secondary | ICD-10-CM

## 2012-02-19 DIAGNOSIS — G8929 Other chronic pain: Secondary | ICD-10-CM | POA: Insufficient documentation

## 2012-02-19 DIAGNOSIS — M47816 Spondylosis without myelopathy or radiculopathy, lumbar region: Secondary | ICD-10-CM

## 2012-02-19 DIAGNOSIS — M47817 Spondylosis without myelopathy or radiculopathy, lumbosacral region: Secondary | ICD-10-CM

## 2012-02-19 DIAGNOSIS — F329 Major depressive disorder, single episode, unspecified: Secondary | ICD-10-CM | POA: Insufficient documentation

## 2012-02-19 DIAGNOSIS — M1712 Unilateral primary osteoarthritis, left knee: Secondary | ICD-10-CM

## 2012-02-19 DIAGNOSIS — R209 Unspecified disturbances of skin sensation: Secondary | ICD-10-CM | POA: Insufficient documentation

## 2012-02-19 MED ORDER — HYDROCODONE-ACETAMINOPHEN 7.5-500 MG PO TABS: 1.0000 | ORAL_TABLET | Freq: Four times a day (QID) | ORAL | Status: DC | PRN

## 2012-02-19 MED ORDER — MORPHINE SULFATE ER 30 MG PO TBCR: 30.0000 mg | EXTENDED_RELEASE_TABLET | Freq: Three times a day (TID) | ORAL | Status: DC

## 2012-02-19 NOTE — Progress Notes (Signed)
Subjective:    Patient ID: Brian Richmond, male    DOB: 06-16-1955, 57 y.o.   MRN: [id number]  HPI The patient complains about chronic LBP which radiates into his posterior hips bilateral. The patient also complains about numbness and tingling in hands and feet. The patient also complains about bilateral knee pain, he states, that he needs to loose about 250 lbs before he could have a TKR. He has gotten out of the habit of working out regulary in a pool and in the gym, to help loosing weight, since the holidays..  The problem has been stable, although he has a little more pain in his feet since he is not going into the pool anymore..  Pain Inventory Average Pain 6 Pain Right Now 6 My pain is constant, burning, tingling and aching  In the last 24 hours, has pain interfered with the following? General activity 9 Relation with others 9 Enjoyment of life 9 What TIME of day is your pain at its worst? night Sleep (in general) Good  Pain is worse with: walking Pain improves with: rest and medication Relief from Meds: 6  Mobility use a walker how many minutes can you walk? 1 ability to climb steps?  yes do you drive?  yes use a wheelchair  Function disabled: date disabled  I need assistance with the following:  bathing  Neuro/Psych bladder control problems numbness tingling  Prior Studies Any changes since last visit?  no  Physicians involved in your care Any changes since last visit?  no   Family History  Problem Relation Age of Onset  . Ovarian cancer Mother   . Cancer Mother   . Heart disease Mother   . Arthritis Father    History   Social History  . Marital Status: Married    Spouse Name: N/A    Number of Children: y  . Years of Education: N/A   Occupational History  . DISABLED    Social History Main Topics  . Smoking status: Never Smoker   . Smokeless tobacco: None  . Alcohol Use: None  . Drug Use: None  . Sexually Active: None   Other Topics  Concern  . None   Social History Narrative   Pt is married and lives with wife.     Past Surgical History  Procedure Date  . Colon surgery 03/2008   Past Medical History  Diagnosis Date  . Colon cancer 03/2008  . OSA (obstructive sleep apnea)   . Neuropathy   . Obesity   . Carpal tunnel syndrome    BP 146/64  Pulse 72  Resp 16  Ht 5\' 9"  (1.753 m)  Wt 497 lb (225.438 kg)  BMI 73.39 kg/m2  SpO2 94%     Review of Systems  Musculoskeletal: Positive for back pain.  Neurological: Positive for numbness.  Psychiatric/Behavioral: Positive for dysphoric mood.  All other systems reviewed and are negative.       Objective:   Physical Exam Constitutional: He is oriented to person, place, and time. He appears well-developed and well-nourished.  Morbidly obese in wheelchair  HENT:  Head: Normocephalic and atraumatic.  Mouth/Throat: Oropharynx is clear and moist.  Eyes: Conjunctivae and EOM are normal. Pupils are equal, round, and reactive to light.  Neck: Normal range of motion.  Cardiovascular: Normal rate and regular rhythm.  Pulmonary/Chest: Effort normal and breath sounds normal. No respiratory distress. He has no wheezes.  Abdominal: Soft. He exhibits no distension.  Musculoskeletal:  Muscle strength  grossly intact. Right knee: He exhibits decreased range of motion and swelling. tenderness found.  Left knee: He exhibits decreased range of motion and swelling. tenderness found.  Lumbar back: He exhibits decreased range of motion and tenderness.  Neurological: He is alert and oriented to person, place, and time.  Psychiatric: His behavior is normal. Judgment and thought content normal.          Assessment & Plan:  1. Chronic low back pain.  2. Osteoarthritis of the knees bilaterally.  3. Morbid obesity.  4. Depression.  PLAN:  1. He got 2 prescriptions each for this month and next month, one for  Vicodin 7.5/500 one p.o. q.8 hours p.r.n., 90 with no refill.   2. MS Contin CR 15 mg 3 every 12 hours, 180 with no refills.  Questions were encouraged and answered.  3. Has resumed his celexa, his mood is fine today.  Encouraged patient to restart with his workout program in the pool. Also advised patient to talk to his PCP about a referral to a nutritionist, and checking his A1C. 4. F/u with PA in 2 months

## 2012-02-19 NOTE — Patient Instructions (Addendum)
Restart with working out in the pool. Also ask your PCP at your next visit, whether he can refer you to a nutritionist.

## 2012-02-27 ENCOUNTER — Other Ambulatory Visit: Payer: Self-pay | Admitting: Physical Medicine & Rehabilitation

## 2012-03-26 ENCOUNTER — Telehealth: Payer: Self-pay | Admitting: *Deleted

## 2012-03-26 DIAGNOSIS — M47816 Spondylosis without myelopathy or radiculopathy, lumbar region: Secondary | ICD-10-CM

## 2012-03-26 DIAGNOSIS — M1711 Unilateral primary osteoarthritis, right knee: Secondary | ICD-10-CM

## 2012-03-26 MED ORDER — HYDROCODONE-ACETAMINOPHEN 7.5-325 MG PO TABS: 1.0000 | ORAL_TABLET | Freq: Four times a day (QID) | ORAL | Status: DC | PRN

## 2012-03-26 NOTE — Telephone Encounter (Signed)
Hydrocodone  Refilled as 7.5/325 since 7.5/500 is no longer available.

## 2012-04-17 ENCOUNTER — Encounter: Payer: Self-pay | Admitting: Physical Medicine and Rehabilitation

## 2012-04-17 ENCOUNTER — Encounter: Payer: Medicare FFS | Attending: Physical Medicine and Rehabilitation | Admitting: Physical Medicine and Rehabilitation

## 2012-04-17 VITALS — BP 149/77 | HR 77 | Resp 16 | Ht 69.0 in | Wt >= 6400 oz

## 2012-04-17 DIAGNOSIS — M1711 Unilateral primary osteoarthritis, right knee: Secondary | ICD-10-CM

## 2012-04-17 DIAGNOSIS — M47817 Spondylosis without myelopathy or radiculopathy, lumbosacral region: Secondary | ICD-10-CM | POA: Insufficient documentation

## 2012-04-17 DIAGNOSIS — M47816 Spondylosis without myelopathy or radiculopathy, lumbar region: Secondary | ICD-10-CM

## 2012-04-17 DIAGNOSIS — IMO0002 Reserved for concepts with insufficient information to code with codable children: Secondary | ICD-10-CM | POA: Insufficient documentation

## 2012-04-17 DIAGNOSIS — M1712 Unilateral primary osteoarthritis, left knee: Secondary | ICD-10-CM

## 2012-04-17 DIAGNOSIS — M1710 Unilateral primary osteoarthritis, unspecified knee: Secondary | ICD-10-CM | POA: Insufficient documentation

## 2012-04-17 MED ORDER — MORPHINE SULFATE ER 30 MG PO TBCR: 30.0000 mg | EXTENDED_RELEASE_TABLET | Freq: Three times a day (TID) | ORAL | Status: DC

## 2012-04-17 MED ORDER — HYDROCODONE-ACETAMINOPHEN 7.5-325 MG PO TABS: 1.0000 | ORAL_TABLET | Freq: Four times a day (QID) | ORAL | Status: DC | PRN

## 2012-04-17 NOTE — Progress Notes (Signed)
Subjective:    Patient ID: Brian Richmond, male    DOB: Aug 30, 1955, 57 y.o.   MRN: [id number]  HPI The patient complains about chronic LBP which radiates into his posterior hips bilateral. The patient also complains about numbness and tingling in hands and feet. The patient also complains about bilateral knee pain, he states, that he needs to loose about 250 lbs before he could have a TKR. He has gotten out of the habit of working out regulary in a pool and in the gym, to help loosing weight, since the holidays..  The problem has been stable, although he has a little more pain in his feet since he is not going into the pool anymore. The patient reports that he was diagnosed with DM last Friday, his BS were 488, and 260, he received an insulin shot, and is now on Metformin. He will follow up with his PCP this Friday.  Pain Inventory Average Pain 6 Pain Right Now 6 My pain is constant, dull and aching  In the last 24 hours, has pain interfered with the following? General activity 9 Relation with others 9 Enjoyment of life 9 What TIME of day is your pain at its worst? night Sleep (in general) Fair  Pain is worse with: walking and standing Pain improves with: rest and medication Relief from Meds: 5  Mobility use a walker how many minutes can you walk? 1 ability to climb steps?  yes do you drive?  yes use a wheelchair  Function disabled: date disabled 02 I need assistance with the following:  bathing  Neuro/Psych bladder control problems numbness tingling  Prior Studies Any changes since last visit?  no  Physicians involved in your care [place]?   Family History  Problem Relation Age of Onset  . Ovarian cancer Mother   . Cancer Mother   . Heart disease Mother   . Arthritis Father    History   Social History  . Marital Status: Married    Spouse Name: N/A    Number of Children: y  . Years of Education: N/A   Occupational History  . DISABLED     Social History Main Topics  . Smoking status: Never Smoker   . Smokeless tobacco: None  . Alcohol Use: None  . Drug Use: None  . Sexually Active: None   Other Topics Concern  . None   Social History Narrative   Pt is married and lives with wife.     Past Surgical History  Procedure Laterality Date  . Colon surgery  03/2008   Past Medical History  Diagnosis Date  . Colon cancer 03/2008  . OSA (obstructive sleep apnea)   . Neuropathy   . Obesity   . Carpal tunnel syndrome    BP 149/77  Pulse 77  Resp 16  Ht 5\' 9"  (1.753 m)  Wt 495 lb (224.531 kg)  BMI 73.07 kg/m2  SpO2 96%     Review of Systems  Respiratory: Positive for apnea.   Gastrointestinal: Positive for constipation.  Musculoskeletal: Positive for back pain.  Neurological: Positive for numbness.  All other systems reviewed and are negative.       Objective:   Physical Exam Constitutional: He is oriented to person, place, and time. He appears well-developed and well-nourished.  Morbidly obese in wheelchair, walks with a walker in the house  HENT:  Head: Normocephalic and atraumatic.  Mouth/Throat: Oropharynx is clear and moist.  Eyes: Conjunctivae and EOM are normal. Pupils are  equal, round, and reactive to light.  Neck: Normal range of motion.  Cardiovascular: Normal rate and regular rhythm.  Pulmonary/Chest: Effort normal and breath sounds normal. No respiratory distress. He has no wheezes.  Abdominal: Soft. He exhibits no distension.  Musculoskeletal:  Muscle strength grossly intact. Right knee: He exhibits decreased range of motion, ext -5 degrees, flex 100 degrees and swelling. tenderness found.  Left knee: He exhibits decreased range of motion, extension -5 degrees, flexion 90 degrees and swelling. tenderness found.  Lumbar back: He exhibits decreased range of motion and tenderness.  Neurological: He is alert and oriented to person, place, and time.  Psychiatric: His behavior is normal.  Judgment and thought content normal.          Assessment & Plan:  1. Chronic low back pain.  2. Osteoarthritis of the knees bilaterally.  3. Morbid obesity.  4. Depression.  PLAN:  1. He got 2 prescriptions each for this month and next month, one for  Vicodin 7.5/500 one p.o. q.8 hours p.r.n., 90 with no refill.  2. MS Contin CR 15 mg 3 every 12 hours, 180 with no refills.  Questions were encouraged and answered.  3. Has resumed his celexa, his mood is fine today.  Encouraged patient to restart with his workout program in the pool. Also advised patient to talk to his PCP about a referral to a nutritionist, and checking his A1C, at his last visit.He was diagnosed with DM since this last visit, his BS were 488, and 260, he is now on Metformin and will follow up with his PCp this Friday.  4. F/u with PA in 2 months

## 2012-04-17 NOTE — Patient Instructions (Addendum)
Try to stay as active as tolerated, try to get back into aquatic exercising. Follow up for your diabetis with your PCP.

## 2012-06-07 ENCOUNTER — Other Ambulatory Visit: Payer: Self-pay

## 2012-06-07 NOTE — Telephone Encounter (Signed)
Medication request error.

## 2012-06-12 ENCOUNTER — Encounter: Payer: Medicare FFS | Attending: Physical Medicine and Rehabilitation | Admitting: Physical Medicine and Rehabilitation

## 2012-06-12 ENCOUNTER — Encounter: Payer: Self-pay | Admitting: Physical Medicine and Rehabilitation

## 2012-06-12 VITALS — BP 142/60 | HR 68 | Resp 16 | Ht 69.0 in | Wt >= 6400 oz

## 2012-06-12 DIAGNOSIS — M545 Low back pain, unspecified: Secondary | ICD-10-CM | POA: Insufficient documentation

## 2012-06-12 DIAGNOSIS — M47817 Spondylosis without myelopathy or radiculopathy, lumbosacral region: Secondary | ICD-10-CM

## 2012-06-12 DIAGNOSIS — M1711 Unilateral primary osteoarthritis, right knee: Secondary | ICD-10-CM

## 2012-06-12 DIAGNOSIS — Z79899 Other long term (current) drug therapy: Secondary | ICD-10-CM

## 2012-06-12 DIAGNOSIS — F329 Major depressive disorder, single episode, unspecified: Secondary | ICD-10-CM | POA: Insufficient documentation

## 2012-06-12 DIAGNOSIS — M1712 Unilateral primary osteoarthritis, left knee: Secondary | ICD-10-CM

## 2012-06-12 DIAGNOSIS — G8929 Other chronic pain: Secondary | ICD-10-CM | POA: Insufficient documentation

## 2012-06-12 DIAGNOSIS — F3289 Other specified depressive episodes: Secondary | ICD-10-CM | POA: Insufficient documentation

## 2012-06-12 DIAGNOSIS — M47816 Spondylosis without myelopathy or radiculopathy, lumbar region: Secondary | ICD-10-CM

## 2012-06-12 DIAGNOSIS — M1710 Unilateral primary osteoarthritis, unspecified knee: Secondary | ICD-10-CM

## 2012-06-12 DIAGNOSIS — Z5181 Encounter for therapeutic drug level monitoring: Secondary | ICD-10-CM

## 2012-06-12 DIAGNOSIS — M25559 Pain in unspecified hip: Secondary | ICD-10-CM | POA: Insufficient documentation

## 2012-06-12 DIAGNOSIS — E119 Type 2 diabetes mellitus without complications: Secondary | ICD-10-CM | POA: Insufficient documentation

## 2012-06-12 MED ORDER — MORPHINE SULFATE ER 30 MG PO TBCR: 30.0000 mg | EXTENDED_RELEASE_TABLET | Freq: Three times a day (TID) | ORAL | Status: DC

## 2012-06-12 MED ORDER — HYDROCODONE-ACETAMINOPHEN 7.5-325 MG PO TABS: 1.0000 | ORAL_TABLET | Freq: Four times a day (QID) | ORAL | Status: DC | PRN

## 2012-06-12 NOTE — Progress Notes (Signed)
Subjective:    Patient ID: Brian Richmond, male    DOB: 12-May-1955, 57 y.o.   MRN: [id number]  HPI The patient complains about chronic LBP which radiates into his posterior hips bilateral. The patient also complains about numbness and tingling in hands and feet. The patient also complains about bilateral knee pain, he states, that he needs to loose about 250 lbs before he could have a TKR. He has gotten out of the habit of working out regulary in a pool and in the gym, to help loosing weight, since the holidays..  The problem has been stable, although he has a little more pain in his feet since he is not going into the pool anymore.  The patient reports that he was diagnosed with DM , his BS were 488, and 260, he received an insulin shot, and is now on Metformin. He is following up with his PCP.   Pain Inventory Average Pain 7 Pain Right Now 6 My pain is constant, tingling and aching  In the last 24 hours, has pain interfered with the following? General activity 9 Relation with others 9 Enjoyment of life 9 What TIME of day is your pain at its worst? night Sleep (in general) Fair  Pain is worse with: walking and standing Pain improves with: rest and medication Relief from Meds: 7  Mobility use a walker how many minutes can you walk? 0 ability to climb steps?  yes do you drive?  yes use a wheelchair  Function disabled: date disabled . I need assistance with the following:  meal prep  Neuro/Psych bladder control problems numbness tingling depression  Prior Studies Diabetic  Physicians involved in your care Urologist   Family History  Problem Relation Age of Onset  . Ovarian cancer Mother   . Cancer Mother   . Heart disease Mother   . Arthritis Father    History   Social History  . Marital Status: Married    Spouse Name: N/A    Number of Children: y  . Years of Education: N/A   Occupational History  . DISABLED    Social History Main Topics  . Smoking  status: Never Smoker   . Smokeless tobacco: None  . Alcohol Use: None  . Drug Use: None  . Sexually Active: None   Other Topics Concern  . None   Social History Narrative   Pt is married and lives with wife.     Past Surgical History  Procedure Laterality Date  . Colon surgery  03/2008   Past Medical History  Diagnosis Date  . Colon cancer 03/2008  . OSA (obstructive sleep apnea)   . Neuropathy   . Obesity   . Carpal tunnel syndrome    BP 142/60  Pulse 68  Resp 16  Ht 5\' 9"  (1.753 m)  Wt 467 lb (211.83 kg)  BMI 68.93 kg/m2  SpO2 95%     Review of Systems  Respiratory: Positive for apnea.   Gastrointestinal: Positive for constipation.  Genitourinary: Positive for difficulty urinating.  Neurological: Positive for numbness.  Psychiatric/Behavioral: Positive for dysphoric mood.  All other systems reviewed and are negative.       Objective:   Physical Exam Constitutional: He is oriented to person, place, and time. He appears well-developed and well-nourished.  Morbidly obese in wheelchair, walks with a walker in the house  HENT:  Head: Normocephalic and atraumatic.  Mouth/Throat: Oropharynx is clear and moist.  Eyes: Conjunctivae and EOM are normal. Pupils are  equal, round, and reactive to light.  Neck: Normal range of motion.  Cardiovascular: Normal rate and regular rhythm.  Pulmonary/Chest: Effort normal and breath sounds normal. No respiratory distress. He has no wheezes.  Abdominal: Soft. He exhibits no distension.  Musculoskeletal:  Muscle strength grossly intact. Right knee: He exhibits decreased range of motion, ext -5 degrees, flex 100 degrees and swelling. tenderness found.  Left knee: He exhibits decreased range of motion, extension -5 degrees, flexion 90 degrees and swelling. tenderness found.  Lumbar back: He exhibits decreased range of motion and tenderness.  Neurological: He is alert and oriented to person, place, and time.  Psychiatric: His  behavior is normal. Judgment and thought content normal.         Assessment & Plan:  1. Chronic low back pain.  2. Osteoarthritis of the knees bilaterally.  3. Morbid obesity.  4. Depression. 5. DM  PLAN:  1. He got 2 prescriptions each for this month and next month, one for  Vicodin 7.5/500 one p.o. q.8 hours p.r.n., 90 with no refill.  2. MS Contin CR 15 mg 3 every 12 hours, 180 with no refills.  Questions were encouraged and answered.  3. Has resumed his celexa, his mood is fine today.  Encouraged patient to restart with his workout program in the pool. Also advised patient to talk to his PCP about a referral to a nutritionist, and checking his A1C, at his last visit.He was diagnosed with DM , his BS were 488, and 260, he is now on Metformin and will follow up with his PCp.  4. F/u with PA in 2 months

## 2012-06-12 NOTE — Patient Instructions (Signed)
Try to get back to your aquatic exercises.

## 2012-07-03 ENCOUNTER — Other Ambulatory Visit: Payer: Self-pay | Admitting: Physical Medicine & Rehabilitation

## 2012-08-21 ENCOUNTER — Encounter: Payer: Medicare FFS | Attending: Physical Medicine and Rehabilitation | Admitting: Physical Medicine and Rehabilitation

## 2012-08-21 ENCOUNTER — Encounter: Payer: Self-pay | Admitting: Physical Medicine and Rehabilitation

## 2012-08-21 VITALS — Resp 16 | Ht 69.0 in | Wt >= 6400 oz

## 2012-08-21 DIAGNOSIS — M25569 Pain in unspecified knee: Secondary | ICD-10-CM | POA: Insufficient documentation

## 2012-08-21 DIAGNOSIS — M1710 Unilateral primary osteoarthritis, unspecified knee: Secondary | ICD-10-CM

## 2012-08-21 DIAGNOSIS — F3289 Other specified depressive episodes: Secondary | ICD-10-CM | POA: Insufficient documentation

## 2012-08-21 DIAGNOSIS — M545 Low back pain, unspecified: Secondary | ICD-10-CM | POA: Insufficient documentation

## 2012-08-21 DIAGNOSIS — M47816 Spondylosis without myelopathy or radiculopathy, lumbar region: Secondary | ICD-10-CM

## 2012-08-21 DIAGNOSIS — F329 Major depressive disorder, single episode, unspecified: Secondary | ICD-10-CM | POA: Insufficient documentation

## 2012-08-21 DIAGNOSIS — E119 Type 2 diabetes mellitus without complications: Secondary | ICD-10-CM | POA: Insufficient documentation

## 2012-08-21 DIAGNOSIS — M1711 Unilateral primary osteoarthritis, right knee: Secondary | ICD-10-CM

## 2012-08-21 DIAGNOSIS — M25559 Pain in unspecified hip: Secondary | ICD-10-CM | POA: Insufficient documentation

## 2012-08-21 DIAGNOSIS — G8929 Other chronic pain: Secondary | ICD-10-CM | POA: Insufficient documentation

## 2012-08-21 DIAGNOSIS — M47817 Spondylosis without myelopathy or radiculopathy, lumbosacral region: Secondary | ICD-10-CM

## 2012-08-21 DIAGNOSIS — M1712 Unilateral primary osteoarthritis, left knee: Secondary | ICD-10-CM

## 2012-08-21 MED ORDER — HYDROCODONE-ACETAMINOPHEN 7.5-325 MG PO TABS: 1.0000 | ORAL_TABLET | Freq: Four times a day (QID) | ORAL | Status: DC | PRN

## 2012-08-21 MED ORDER — MORPHINE SULFATE ER 30 MG PO TBCR: 30.0000 mg | EXTENDED_RELEASE_TABLET | Freq: Three times a day (TID) | ORAL | Status: DC

## 2012-08-21 NOTE — Patient Instructions (Signed)
Try to eat a healthy diet and try to get back into aquatic exercising

## 2012-08-21 NOTE — Progress Notes (Signed)
Subjective:    Patient ID: Brian Richmond, male    DOB: 05-27-1955, 57 y.o.   MRN: [id number]  HPI The patient complains about chronic LBP which radiates into his posterior hips bilateral. The patient also complains about numbness and tingling in hands and feet. The patient also complains about bilateral knee pain, he states, that he needs to loose about 250 lbs before he could have a TKR. He has gotten out of the habit of working out regulary in a pool and in the gym, to help loosing weight, since the holidays..  The problem has been stable, although he has a little more pain in his feet since he is not going into the pool anymore.  The patient reports that he was diagnosed with DM , his BS were 488, and 260, he received an insulin shot, and is now on Metformin and glyburide. He is following up with his PCP, he states, that his BS levels are better controlled now. He also reports that he has seen a nutritionist, and will schedule an appointment with an educator for his DM.  Pain Inventory Average Pain 7 Pain Right Now 7 My pain is constant, burning and aching  In the last 24 hours, has pain interfered with the following? General activity 10 Relation with others 10 Enjoyment of life 10 What TIME of day is your pain at its worst? morning and night Sleep (in general) Fair  Pain is worse with: walking and standing Pain improves with: rest and medication Relief from Meds: 7  Mobility use a walker how many minutes can you walk? 1 ability to climb steps?  yes do you drive?  yes use a wheelchair  Function disabled: date disabled 2002  Neuro/Psych bladder control problems numbness tingling  Prior Studies Any changes since last visit?  no  Physicians involved in your care Any changes since last visit?  no   Family History  Problem Relation Age of Onset  . Ovarian cancer Mother   . Cancer Mother   . Heart disease Mother   . Arthritis Father    History   Social History   . Marital Status: Married    Spouse Name: N/A    Number of Children: y  . Years of Education: N/A   Occupational History  . DISABLED    Social History Main Topics  . Smoking status: Never Smoker   . Smokeless tobacco: None  . Alcohol Use: None  . Drug Use: None  . Sexually Active: None   Other Topics Concern  . None   Social History Narrative   Pt is married and lives with wife.     Past Surgical History  Procedure Laterality Date  . Colon surgery  03/2008   Past Medical History  Diagnosis Date  . Colon cancer 03/2008  . OSA (obstructive sleep apnea)   . Neuropathy   . Obesity   . Carpal tunnel syndrome   . Diabetes mellitus without complication    Resp 16  Ht 5\' 9"  (1.753 m)  Wt 468 lb (212.283 kg)  BMI 69.08 kg/m2     Review of Systems  Respiratory: Positive for apnea.   Gastrointestinal: Positive for constipation.  Genitourinary: Positive for difficulty urinating.  Musculoskeletal: Positive for back pain and gait problem.  Neurological: Positive for numbness.  All other systems reviewed and are negative.       Objective:   Physical Exam Constitutional: He is oriented to person, place, and time. He appears well-developed and  well-nourished.  Morbidly obese in wheelchair, walks with a walker in the house  HENT:  Head: Normocephalic and atraumatic.  Mouth/Throat: Oropharynx is clear and moist.  Eyes: Conjunctivae and EOM are normal. Pupils are equal, round, and reactive to light.  Neck: Normal range of motion.  Cardiovascular: Normal rate and regular rhythm.  Pulmonary/Chest: Effort normal and breath sounds normal. No respiratory distress. He has no wheezes.  Abdominal: Soft. He exhibits no distension.  Musculoskeletal:  Muscle strength grossly intact. Right knee: He exhibits decreased range of motion, ext -5 degrees, flex 100 degrees and swelling. tenderness found.  Left knee: He exhibits decreased range of motion, extension -5 degrees, flexion 90  degrees and swelling. tenderness found.  Lumbar back: He exhibits decreased range of motion and tenderness.  Neurological: He is alert and oriented to person, place, and time.  Psychiatric: His behavior is normal. Judgment and thought content normal.         Assessment & Plan:  1. Chronic low back pain.  2. Osteoarthritis of the knees bilaterally.  3. Morbid obesity.  4. Depression.  5. DM  PLAN:  1. He got 2 prescriptions each for this month and next month, one for  Vicodin 7.5/500 one p.o. q.8 hours p.r.n., 90 with no refill.  2. MS Contin CR 15 mg 3 every 12 hours, 180 with no refills.  Questions were encouraged and answered.  3. Has resumed his celexa, his mood is fine .  Encouraged patient to restart with his workout program in the pool. Also advised patient to talk to his PCP about a referral to a nutritionist,which he did, and he saw a nutritionist.  4. F/u with PA in 2 months

## 2012-10-17 ENCOUNTER — Encounter: Payer: Medicare FFS | Attending: Physical Medicine and Rehabilitation | Admitting: Physical Medicine and Rehabilitation

## 2012-10-17 ENCOUNTER — Encounter: Payer: Self-pay | Admitting: Physical Medicine and Rehabilitation

## 2012-10-17 VITALS — BP 144/49 | HR 73 | Resp 18 | Ht 69.0 in | Wt >= 6400 oz

## 2012-10-17 DIAGNOSIS — M47816 Spondylosis without myelopathy or radiculopathy, lumbar region: Secondary | ICD-10-CM

## 2012-10-17 DIAGNOSIS — M545 Low back pain, unspecified: Secondary | ICD-10-CM | POA: Insufficient documentation

## 2012-10-17 DIAGNOSIS — M1710 Unilateral primary osteoarthritis, unspecified knee: Secondary | ICD-10-CM | POA: Insufficient documentation

## 2012-10-17 DIAGNOSIS — M1712 Unilateral primary osteoarthritis, left knee: Secondary | ICD-10-CM

## 2012-10-17 DIAGNOSIS — M1711 Unilateral primary osteoarthritis, right knee: Secondary | ICD-10-CM

## 2012-10-17 DIAGNOSIS — F329 Major depressive disorder, single episode, unspecified: Secondary | ICD-10-CM | POA: Insufficient documentation

## 2012-10-17 DIAGNOSIS — F3289 Other specified depressive episodes: Secondary | ICD-10-CM | POA: Insufficient documentation

## 2012-10-17 DIAGNOSIS — Z79899 Other long term (current) drug therapy: Secondary | ICD-10-CM

## 2012-10-17 DIAGNOSIS — G8929 Other chronic pain: Secondary | ICD-10-CM | POA: Insufficient documentation

## 2012-10-17 DIAGNOSIS — E119 Type 2 diabetes mellitus without complications: Secondary | ICD-10-CM | POA: Insufficient documentation

## 2012-10-17 DIAGNOSIS — M47817 Spondylosis without myelopathy or radiculopathy, lumbosacral region: Secondary | ICD-10-CM

## 2012-10-17 DIAGNOSIS — IMO0002 Reserved for concepts with insufficient information to code with codable children: Secondary | ICD-10-CM

## 2012-10-17 DIAGNOSIS — Z5181 Encounter for therapeutic drug level monitoring: Secondary | ICD-10-CM

## 2012-10-17 MED ORDER — MORPHINE SULFATE ER 30 MG PO TBCR: 30.0000 mg | EXTENDED_RELEASE_TABLET | Freq: Three times a day (TID) | ORAL | Status: DC

## 2012-10-17 MED ORDER — HYDROCODONE-ACETAMINOPHEN 7.5-325 MG PO TABS: 1.0000 | ORAL_TABLET | Freq: Four times a day (QID) | ORAL | Status: DC | PRN

## 2012-10-17 NOTE — Patient Instructions (Signed)
Try to get back into aquatic exercising

## 2012-10-17 NOTE — Progress Notes (Signed)
Subjective:    Patient ID: Brian Richmond, male    DOB: 09-Nov-1955, 57 y.o.   MRN: [id number]  HPI The patient complains about chronic LBP which radiates into his posterior hips bilateral. The patient also complains about numbness and tingling in hands and feet. The patient also complains about bilateral knee pain, he states, that he needs to loose about 250 lbs before he could have a TKR. He has gotten out of the habit of working out regulary in a pool and in the gym, to help loosing weight, since the holidays..  The problem has been stable, although he has a little more pain in his feet since he is not going into the pool anymore.  The patient reports that he was diagnosed with DM , his BS were 488, and 260, he received an insulin shot, and is now on Metformin and glyburide. He is following up with his PCP, he states, that his BS levels are better controlled now. He also reports that he has seen a nutritionist, and is eating a healthier diet, he states, that he has already lost at least 45 lbs.  Pain Inventory Average Pain 7 Pain Right Now 7 My pain is constant, dull and aching  In the last 24 hours, has pain interfered with the following? General activity 9 Relation with others 9 Enjoyment of life 9 What TIME of day is your pain at its worst? night Sleep (in general) Good  Pain is worse with: walking and standing Pain improves with: rest and medication Relief from Meds: 7  Mobility use a walker how many minutes can you walk? 1 ability to climb steps?  yes do you drive?  yes use a wheelchair  Function disabled: date disabled .  Neuro/Psych bladder control problems numbness  Prior Studies Any changes since last visit?  no  Physicians involved in your care Any changes since last visit?  no   Family History  Problem Relation Age of Onset  . Ovarian cancer Mother   . Cancer Mother   . Heart disease Mother   . Arthritis Father    History   Social History  .  Marital Status: Married    Spouse Name: N/A    Number of Children: y  . Years of Education: N/A   Occupational History  . DISABLED    Social History Main Topics  . Smoking status: Never Smoker   . Smokeless tobacco: None  . Alcohol Use: None  . Drug Use: None  . Sexual Activity: None   Other Topics Concern  . None   Social History Narrative   Pt is married and lives with wife.     Past Surgical History  Procedure Laterality Date  . Colon surgery  03/2008   Past Medical History  Diagnosis Date  . Colon cancer 03/2008  . OSA (obstructive sleep apnea)   . Neuropathy   . Obesity   . Carpal tunnel syndrome   . Diabetes mellitus without complication    BP 144/49  Pulse 73  Resp 18  Ht 5\' 9"  (1.753 m)  Wt 468 lb (212.283 kg)  BMI 69.08 kg/m2  SpO2 94%     Review of Systems  Respiratory: Positive for apnea.   Gastrointestinal: Positive for constipation.  Genitourinary: Positive for difficulty urinating.  Neurological: Positive for numbness.  All other systems reviewed and are negative.       Objective:   Physical Exam Constitutional: He is oriented to person, place, and time. He  appears well-developed and well-nourished.  Morbidly obese in wheelchair, walks with a walker in the house  HENT:  Head: Normocephalic and atraumatic.  Mouth/Throat: Oropharynx is clear and moist.  Eyes: Conjunctivae and EOM are normal. Pupils are equal, round, and reactive to light.  Neck: Normal range of motion.  Cardiovascular: Normal rate and regular rhythm.  Pulmonary/Chest: Effort normal and breath sounds normal. No respiratory distress. He has no wheezes.  Abdominal: Soft. He exhibits no distension.  Musculoskeletal:  Muscle strength grossly intact. Right knee: He exhibits decreased range of motion, ext -5 degrees, flex 100 degrees and swelling. tenderness found.  Left knee: He exhibits decreased range of motion, extension -5 degrees, flexion 90 degrees and swelling.  tenderness found.  Lumbar back: He exhibits decreased range of motion and tenderness.  Neurological: He is alert and oriented to person, place, and time.  Psychiatric: His behavior is normal. Judgment and thought content normal.          Assessment & Plan:  1. Chronic low back pain.  2. Osteoarthritis of the knees bilaterally.  3. Morbid obesity.  4. Depression.  5. DM  PLAN:  1. He got 2 prescriptions each for this month and next month, one for  Vicodin 7.5/500 one p.o. q.8 hours p.r.n., 90 with no refill.  2. MS Contin CR 30 mg 3 every 12 hours, 90 with no refills.  Questions were encouraged and answered.  3. Has resumed his celexa, his mood is fine .  Encouraged patient to restart with his workout program in the pool. Continue with his healthy diet and with loosing weight, he lost at least 45lbs, since he has seen a nutritionist and has changed his diet. 4. F/u with PA in 2 months

## 2012-10-28 ENCOUNTER — Other Ambulatory Visit: Payer: Self-pay | Admitting: Physical Medicine & Rehabilitation

## 2012-11-21 ENCOUNTER — Other Ambulatory Visit: Payer: Self-pay

## 2012-12-04 ENCOUNTER — Encounter: Payer: Self-pay | Admitting: Pulmonary Disease

## 2012-12-04 ENCOUNTER — Ambulatory Visit (INDEPENDENT_AMBULATORY_CARE_PROVIDER_SITE_OTHER): Payer: Medicare HMO | Admitting: Pulmonary Disease

## 2012-12-04 VITALS — BP 112/62 | HR 69 | Temp 97.7°F | Ht 69.0 in | Wt >= 6400 oz

## 2012-12-04 DIAGNOSIS — G4733 Obstructive sleep apnea (adult) (pediatric): Secondary | ICD-10-CM

## 2012-12-04 NOTE — Patient Instructions (Signed)
Continue on cpap, and keep up with mask changes and supplies. Keep working on weight loss.  You are doing well.  followup with me in one year.

## 2012-12-04 NOTE — Progress Notes (Signed)
  Subjective:    Patient ID: Brian Richmond, male    DOB: December 23, 1955, 57 y.o.   MRN: [id number]  HPI The patient comes in today for followup of his obstructive sleep apnea.  He is wearing CPAP compliantly, and is having no issues with his mask fit or pressure.  He is sleeping well with the device, and is satisfied with his daytime alertness.  He has lost 36 pounds since his last visit, and I have commended him on this.   Review of Systems  Constitutional: Negative for fever and unexpected weight change.  HENT: Negative for congestion, dental problem, ear pain, nosebleeds, postnasal drip, rhinorrhea, sinus pressure, sneezing, sore throat and trouble swallowing.   Eyes: Negative for redness and itching.  Respiratory: Negative for cough, chest tightness, shortness of breath and wheezing.   Cardiovascular: Negative for palpitations and leg swelling.  Gastrointestinal: Negative for nausea and vomiting.  Genitourinary: Negative for dysuria.  Musculoskeletal: Negative for joint swelling.  Skin: Negative for rash.  Neurological: Negative for headaches.  Hematological: Does not bruise/bleed easily.  Psychiatric/Behavioral: Negative for dysphoric mood. The patient is not nervous/anxious.        Objective:   Physical Exam Morbidly obese male in nad Nose without purulence or d/c noted.  Neck without LN or TMG No skin breakdown or pressure necrosis from cpap mask. LE with edema noted, no cyanosis Alert, does not appear to be sleepy, moves all 4.        Assessment & Plan:

## 2012-12-04 NOTE — Assessment & Plan Note (Signed)
The patient is doing very well with CPAP, and has lost 36 pounds since his last visit.  I have asked him to keep it up, and also to make sure that he replaces his supplies and mask cushions on a regular basis.  He will follow up again with me in one year.

## 2012-12-17 ENCOUNTER — Other Ambulatory Visit: Payer: Self-pay | Admitting: *Deleted

## 2012-12-17 DIAGNOSIS — M1712 Unilateral primary osteoarthritis, left knee: Secondary | ICD-10-CM

## 2012-12-17 DIAGNOSIS — M47816 Spondylosis without myelopathy or radiculopathy, lumbar region: Secondary | ICD-10-CM

## 2012-12-17 DIAGNOSIS — M1711 Unilateral primary osteoarthritis, right knee: Secondary | ICD-10-CM

## 2012-12-17 MED ORDER — HYDROCODONE-ACETAMINOPHEN 7.5-325 MG PO TABS: 1.0000 | ORAL_TABLET | Freq: Four times a day (QID) | ORAL | Status: DC | PRN

## 2012-12-17 MED ORDER — MORPHINE SULFATE ER 30 MG PO TBCR: 30.0000 mg | EXTENDED_RELEASE_TABLET | Freq: Three times a day (TID) | ORAL | Status: DC

## 2012-12-17 MED ORDER — HYDROCODONE-ACETAMINOPHEN 7.5-325 MG PO TABS
1.0000 | ORAL_TABLET | Freq: Four times a day (QID) | ORAL | Status: DC | PRN
Start: 2012-12-17 — End: 2012-12-17

## 2012-12-17 NOTE — Telephone Encounter (Signed)
rx (for 2 months) printed early for controlled medication for the visit with RN on 12/23/12 (to be signed by MD)

## 2012-12-23 ENCOUNTER — Encounter: Payer: Medicare HMO | Attending: Physical Medicine & Rehabilitation | Admitting: *Deleted

## 2012-12-23 ENCOUNTER — Ambulatory Visit: Payer: Medicare FFS | Admitting: Physical Medicine and Rehabilitation

## 2012-12-23 VITALS — BP 141/65 | HR 65 | Resp 16 | Ht 69.0 in | Wt >= 6400 oz

## 2012-12-23 DIAGNOSIS — M1711 Unilateral primary osteoarthritis, right knee: Secondary | ICD-10-CM

## 2012-12-23 DIAGNOSIS — M1712 Unilateral primary osteoarthritis, left knee: Secondary | ICD-10-CM

## 2012-12-23 DIAGNOSIS — M545 Low back pain, unspecified: Secondary | ICD-10-CM | POA: Insufficient documentation

## 2012-12-23 DIAGNOSIS — M47816 Spondylosis without myelopathy or radiculopathy, lumbar region: Secondary | ICD-10-CM

## 2012-12-23 DIAGNOSIS — G8929 Other chronic pain: Secondary | ICD-10-CM | POA: Insufficient documentation

## 2012-12-23 DIAGNOSIS — M1710 Unilateral primary osteoarthritis, unspecified knee: Secondary | ICD-10-CM | POA: Insufficient documentation

## 2012-12-23 NOTE — Patient Instructions (Addendum)
Follow up two month with RN for pill count and med refill

## 2012-12-23 NOTE — Progress Notes (Addendum)
Here for pill count and medication refills. Hydrocodone 7.5/325 # 90 Fill date 12/09/12   Today NV#[id number]  Morphine Sulfate ER 30 mg # 90 fill date 12/09/12 today NB# [id number].  Pill count appropriate and VSS. No changes to med list and no new complaints. Refills given

## 2013-02-19 ENCOUNTER — Encounter: Payer: Self-pay | Admitting: Physical Medicine & Rehabilitation

## 2013-02-19 ENCOUNTER — Encounter: Payer: Medicare HMO | Attending: Physical Medicine & Rehabilitation | Admitting: Physical Medicine & Rehabilitation

## 2013-02-19 VITALS — Ht 69.0 in | Wt >= 6400 oz

## 2013-02-19 DIAGNOSIS — IMO0002 Reserved for concepts with insufficient information to code with codable children: Secondary | ICD-10-CM

## 2013-02-19 DIAGNOSIS — M545 Low back pain, unspecified: Secondary | ICD-10-CM | POA: Insufficient documentation

## 2013-02-19 DIAGNOSIS — F329 Major depressive disorder, single episode, unspecified: Secondary | ICD-10-CM

## 2013-02-19 DIAGNOSIS — M1710 Unilateral primary osteoarthritis, unspecified knee: Secondary | ICD-10-CM | POA: Insufficient documentation

## 2013-02-19 DIAGNOSIS — M47816 Spondylosis without myelopathy or radiculopathy, lumbar region: Secondary | ICD-10-CM

## 2013-02-19 DIAGNOSIS — Z85038 Personal history of other malignant neoplasm of large intestine: Secondary | ICD-10-CM | POA: Insufficient documentation

## 2013-02-19 DIAGNOSIS — F3289 Other specified depressive episodes: Secondary | ICD-10-CM

## 2013-02-19 DIAGNOSIS — G4733 Obstructive sleep apnea (adult) (pediatric): Secondary | ICD-10-CM

## 2013-02-19 DIAGNOSIS — E1140 Type 2 diabetes mellitus with diabetic neuropathy, unspecified: Secondary | ICD-10-CM | POA: Insufficient documentation

## 2013-02-19 DIAGNOSIS — E1142 Type 2 diabetes mellitus with diabetic polyneuropathy: Secondary | ICD-10-CM

## 2013-02-19 DIAGNOSIS — M1711 Unilateral primary osteoarthritis, right knee: Secondary | ICD-10-CM

## 2013-02-19 DIAGNOSIS — F32A Depression, unspecified: Secondary | ICD-10-CM

## 2013-02-19 DIAGNOSIS — E1149 Type 2 diabetes mellitus with other diabetic neurological complication: Secondary | ICD-10-CM

## 2013-02-19 DIAGNOSIS — M1712 Unilateral primary osteoarthritis, left knee: Secondary | ICD-10-CM

## 2013-02-19 DIAGNOSIS — M47817 Spondylosis without myelopathy or radiculopathy, lumbosacral region: Secondary | ICD-10-CM

## 2013-02-19 DIAGNOSIS — G8929 Other chronic pain: Secondary | ICD-10-CM | POA: Insufficient documentation

## 2013-02-19 MED ORDER — HYDROCODONE-ACETAMINOPHEN 7.5-325 MG PO TABS: 1.0000 | ORAL_TABLET | Freq: Four times a day (QID) | ORAL | Status: DC | PRN

## 2013-02-19 MED ORDER — MORPHINE SULFATE ER 30 MG PO TBCR: 30.0000 mg | EXTENDED_RELEASE_TABLET | Freq: Three times a day (TID) | ORAL | Status: DC

## 2013-02-19 NOTE — Progress Notes (Signed)
Subjective:    Patient ID: Brian Richmond, male    DOB: 05-31-1955, 58 y.o.   MRN: [id number]  HPI  Pain Inventory Average Pain 7 Pain Right Now 8 My pain is burning  In the last 24 hours, has pain interfered with the following? General activity 9 Relation with others 9 Enjoyment of life 9 What TIME of day is your pain at its worst? night Sleep (in general) Fair  Pain is worse with: na Pain improves with: rest and medication Relief from Meds: 6  Mobility walk with assistance use a walker how many minutes can you walk? 1 ability to climb steps?  yes do you drive?  yes use a wheelchair  Function disabled: date disabled na I need assistance with the following:  bathing  Neuro/Psych bladder control problems numbness tingling  Prior Studies Any changes since last visit?  no  Physicians involved in your care Any changes since last visit?  no   Family History  Problem Relation Age of Onset  . Ovarian cancer Mother   . Cancer Mother   . Heart disease Mother   . Arthritis Father    History   Social History  . Marital Status: Married    Spouse Name: N/A    Number of Children: y  . Years of Education: N/A   Occupational History  . DISABLED    Social History Main Topics  . Smoking status: Never Smoker   . Smokeless tobacco: None  . Alcohol Use: None  . Drug Use: None  . Sexual Activity: None   Other Topics Concern  . None   Social History Narrative   Pt is married and lives with wife.     Past Surgical History  Procedure Laterality Date  . Colon surgery  03/2008   Past Medical History  Diagnosis Date  . Colon cancer 03/2008  . OSA (obstructive sleep apnea)   . Neuropathy   . Obesity   . Carpal tunnel syndrome   . Diabetes mellitus without complication    There were no vitals taken for this visit.  Opioid Risk Score: 1 Fall Risk Score: Moderate Fall Risk (6-13 points) (pt educated on fall risk, brochure give to pt.)   Review of  Systems  Genitourinary:       Bladder control problems  Musculoskeletal: Positive for back pain.  Neurological: Positive for numbness.       Tingling   All other systems reviewed and are negative.       Objective:   Physical Exam  Constitutional: He is oriented to person, place, and time. He appears well-developed and well-nourished.  Morbidly obese although he has lost some weight.  HENT:  Head: Normocephalic and atraumatic.  Mouth/Throat: Oropharynx is clear and moist.  Eyes: Conjunctivae and EOM are normal. Pupils are equal, round, and reactive to light.  Neck: Normal range of motion.  Cardiovascular: Normal rate and regular rhythm.  Pulmonary/Chest: Effort normal and breath sounds normal. No respiratory distress. He has no wheezes.  Abdominal: Soft. He exhibits no distension.  Musculoskeletal:  Knees with effusion and crepitus.  Lumbar back: He exhibits decreased range of motion and tenderness.  Neurological: He is alert and oriented to person, place, and time. Decreased PP and LT in feet. Psychiatric: His behavior is normal. Judgment and thought content normal.  Affect is bright.   Assessment & Plan:   ASSESSMENT:  1. Chronic low back pain.  2. Osteoarthritis of the knees bilaterally.  3. Morbid obesity.  4. Depression.  5. Type 2 DM with peripheral neuropathy  PLAN:  1. I rx'ed 2 prescriptions each for this month and next month, one for  Vicodin 7.5/500 one p.o. q.8 hours p.r.n., 90 with no refill. and MS Contin CR 15 mg 3 every 12 hours, 180 with no refills. Pt has been stable and consistent with his medications. Questions were encouraged and answered. My PA will see him in 2 months  3. Continue with his celexa as it helps his mude. 4. miralax daily for bowels.   5. Reviewed the importance of exercise for his sugars, weight, pain. He understands. Will follow to see if there is any progression in his symptoms and extremity pain.

## 2013-02-19 NOTE — Patient Instructions (Signed)
PLEASE CALL ME WITH ANY PROBLEMS OR QUESTIONS (#[phone]).      

## 2013-04-11 ENCOUNTER — Encounter: Payer: Self-pay | Admitting: Physical Medicine & Rehabilitation

## 2013-04-11 ENCOUNTER — Encounter: Payer: Medicare HMO | Attending: Physical Medicine & Rehabilitation | Admitting: Physical Medicine & Rehabilitation

## 2013-04-11 VITALS — BP 127/52 | HR 64 | Resp 16 | Ht 69.0 in | Wt >= 6400 oz

## 2013-04-11 DIAGNOSIS — M1712 Unilateral primary osteoarthritis, left knee: Secondary | ICD-10-CM

## 2013-04-11 DIAGNOSIS — E1149 Type 2 diabetes mellitus with other diabetic neurological complication: Secondary | ICD-10-CM

## 2013-04-11 DIAGNOSIS — E1142 Type 2 diabetes mellitus with diabetic polyneuropathy: Secondary | ICD-10-CM

## 2013-04-11 DIAGNOSIS — M47816 Spondylosis without myelopathy or radiculopathy, lumbar region: Secondary | ICD-10-CM

## 2013-04-11 DIAGNOSIS — M1711 Unilateral primary osteoarthritis, right knee: Secondary | ICD-10-CM

## 2013-04-11 DIAGNOSIS — M47817 Spondylosis without myelopathy or radiculopathy, lumbosacral region: Secondary | ICD-10-CM

## 2013-04-11 DIAGNOSIS — IMO0002 Reserved for concepts with insufficient information to code with codable children: Secondary | ICD-10-CM

## 2013-04-11 DIAGNOSIS — M1710 Unilateral primary osteoarthritis, unspecified knee: Secondary | ICD-10-CM | POA: Insufficient documentation

## 2013-04-11 DIAGNOSIS — E1140 Type 2 diabetes mellitus with diabetic neuropathy, unspecified: Secondary | ICD-10-CM

## 2013-04-11 MED ORDER — MORPHINE SULFATE ER 30 MG PO TBCR: 30.0000 mg | EXTENDED_RELEASE_TABLET | Freq: Three times a day (TID) | ORAL | Status: DC

## 2013-04-11 MED ORDER — HYDROCODONE-ACETAMINOPHEN 7.5-325 MG PO TABS: 1.0000 | ORAL_TABLET | Freq: Four times a day (QID) | ORAL | Status: DC | PRN

## 2013-04-11 NOTE — Progress Notes (Signed)
Subjective:    Patient ID: Brian Richmond, male    DOB: Dec 24, 1955, 58 y.o.   MRN: [id number]  HPI  Brian Richmond is back regarding his chronic pain. He has remained essentially stable. He lost 40lbs but has hit a bit of a wall and hasn't lost any more. He attributes it to his diabetes and having to have a consistent diet.   Medically he has been stable. His mood has been good. His bowel and bladder function have been regulated. He sleeps at night. No skin breakdown  He continues to have foster kids in the home.   Pain Inventory Average Pain 7 Pain Right Now 7 My pain is constant, tingling and aching  In the last 24 hours, has pain interfered with the following? General activity 9 Relation with others 9 Enjoyment of life 9 What TIME of day is your pain at its worst? morning and evening Sleep (in general) Fair  Pain is worse with: walking and standing Pain improves with: rest and medication Relief from Meds: 6  Mobility use a walker ability to climb steps?  yes do you drive?  yes use a wheelchair  Function disabled: date disabled . I need assistance with the following:  bathing  Neuro/Psych bladder control problems numbness tingling  Prior Studies Any changes since last visit?  no  Physicians involved in your care [place], [place]   Family History  Problem Relation Age of Onset  . Ovarian cancer Mother   . Cancer Mother   . Heart disease Mother   . Arthritis Father    History   Social History  . Marital Status: Married    Spouse Name: N/A    Number of Children: y  . Years of Education: N/A   Occupational History  . DISABLED    Social History Main Topics  . Smoking status: Never Smoker   . Smokeless tobacco: None  . Alcohol Use: None  . Drug Use: None  . Sexual Activity: None   Other Topics Concern  . None   Social History Narrative   Pt is married and lives with wife.     Past Surgical History  Procedure Laterality Date  . Colon surgery   03/2008   Past Medical History  Diagnosis Date  . Colon cancer 03/2008  . OSA (obstructive sleep apnea)   . Neuropathy   . Obesity   . Carpal tunnel syndrome   . Diabetes mellitus without complication    BP 614/43  Pulse 64  Resp 16  Ht 5\' 9"  (1.753 m)  Wt 464 lb (210.469 kg)  BMI 68.49 kg/m2  SpO2 97%  Opioid Risk Score:   Fall Risk Score: Moderate Fall Risk (6-13 points) (patient educated handout declined)   Review of Systems  Respiratory: Positive for apnea.   Gastrointestinal: Positive for constipation.  Genitourinary: Positive for frequency.  Musculoskeletal: Positive for back pain.  Neurological: Positive for numbness.       Tingling  All other systems reviewed and are negative.       Objective:   Physical Exam  Constitutional: He is oriented to person, place, and time. He appears well-developed and well-nourished. Has long hair and beard  Morbidly obese ---weight stable  HENT:  Head: Normocephalic and atraumatic.  Mouth/Throat: Oropharynx is clear and moist.  Eyes: Conjunctivae and EOM are normal. Pupils are equal, round, and reactive to light.  Neck: Normal range of motion.  Cardiovascular: Normal rate and regular rhythm.  Pulmonary/Chest: Effort normal and breath  sounds normal. No respiratory distress. He has no wheezes.  Abdominal: Soft. He exhibits no distension.  Musculoskeletal:  Knees with effusion and crepitus with ROM. Lumbar back: He exhibits decreased range of motion and tenderness. Exam limited by size/wheelcair Neurological: He is alert and oriented to person, place, and time. Decreased PP and LT in feet. Psychiatric: His behavior is normal. Judgment and thought content normal.  Affect is bright.   Assessment & Plan:   ASSESSMENT:  1. Chronic low back pain.  2. Osteoarthritis of the knees bilaterally.  3. Morbid obesity.  4. Depression.  5. Type 2 DM with peripheral neuropathy   PLAN:  1. I rx'ed 2 prescriptions each for this month and  next month, one for  Vicodin 7.5/500 one p.o. q.8 hours p.r.n., 90 with no refill. and MS Contin CR 15 mg 3 every 12 hours, 90 with no refills. Pt has been stable and consistent with his medications.  Second rx'es were given for next month Questions were encouraged and answered. My PA will see him in 2 months  3. Continue with his celexa as it helps his mood.  4. miralax daily for bowels.

## 2013-04-11 NOTE — Patient Instructions (Signed)
PLEASE CALL ME WITH ANY PROBLEMS OR QUESTIONS (#[phone]).      

## 2013-04-28 ENCOUNTER — Other Ambulatory Visit: Payer: Self-pay | Admitting: Gastroenterology

## 2013-06-12 ENCOUNTER — Encounter: Payer: Self-pay | Admitting: Registered Nurse

## 2013-06-12 ENCOUNTER — Encounter: Payer: Medicare HMO | Attending: Physical Medicine & Rehabilitation | Admitting: Registered Nurse

## 2013-06-12 VITALS — BP 130/52 | HR 66 | Resp 14 | Wt >= 6400 oz

## 2013-06-12 DIAGNOSIS — E1142 Type 2 diabetes mellitus with diabetic polyneuropathy: Secondary | ICD-10-CM

## 2013-06-12 DIAGNOSIS — Z5181 Encounter for therapeutic drug level monitoring: Secondary | ICD-10-CM

## 2013-06-12 DIAGNOSIS — Z79899 Other long term (current) drug therapy: Secondary | ICD-10-CM

## 2013-06-12 DIAGNOSIS — E1149 Type 2 diabetes mellitus with other diabetic neurological complication: Secondary | ICD-10-CM

## 2013-06-12 DIAGNOSIS — E1140 Type 2 diabetes mellitus with diabetic neuropathy, unspecified: Secondary | ICD-10-CM

## 2013-06-12 DIAGNOSIS — M1711 Unilateral primary osteoarthritis, right knee: Secondary | ICD-10-CM

## 2013-06-12 DIAGNOSIS — M1712 Unilateral primary osteoarthritis, left knee: Secondary | ICD-10-CM

## 2013-06-12 DIAGNOSIS — IMO0002 Reserved for concepts with insufficient information to code with codable children: Secondary | ICD-10-CM | POA: Insufficient documentation

## 2013-06-12 DIAGNOSIS — M1710 Unilateral primary osteoarthritis, unspecified knee: Secondary | ICD-10-CM

## 2013-06-12 DIAGNOSIS — M47816 Spondylosis without myelopathy or radiculopathy, lumbar region: Secondary | ICD-10-CM

## 2013-06-12 DIAGNOSIS — M47817 Spondylosis without myelopathy or radiculopathy, lumbosacral region: Secondary | ICD-10-CM | POA: Insufficient documentation

## 2013-06-12 MED ORDER — HYDROCODONE-ACETAMINOPHEN 7.5-325 MG PO TABS: 1.0000 | ORAL_TABLET | Freq: Four times a day (QID) | ORAL | Status: DC | PRN

## 2013-06-12 MED ORDER — GABAPENTIN 100 MG PO CAPS: 100.0000 mg | ORAL_CAPSULE | Freq: Three times a day (TID) | ORAL | Status: DC

## 2013-06-12 MED ORDER — MORPHINE SULFATE ER 30 MG PO TBCR: 30.0000 mg | EXTENDED_RELEASE_TABLET | Freq: Three times a day (TID) | ORAL | Status: DC

## 2013-06-12 NOTE — Progress Notes (Signed)
Subjective:    Patient ID: Brian Richmond, male    DOB: 06/20/1955, 58 y.o.   MRN: [id number]  HPI: [name] is a 58 year old male who returns for follow up for chronic pain and medication refill. He says his pain is located in his legs. He's having increased burning in his feet which has increased in intensity over the last few months. He rates his pain 7. He does not follow a exercise regime. He was encouraged to start following an exercise program and can start with chair exercises. He verbalizes understanding. He brought his MS Contin script's was discarded due to duplication. Also script today was printed wrong and discarded and new prescriptions were given.   Pain Inventory Average Pain 8 Pain Right Now 7 My pain is constant, burning, dull and aching  In the last 24 hours, has pain interfered with the following? General activity 9 Relation with others 9 Enjoyment of life 9 What TIME of day is your pain at its worst? morning and evening Sleep (in general) Fair  Pain is worse with: walking and standing Pain improves with: rest and medication Relief from Meds: 4  Mobility use a walker use a wheelchair  Function disabled: date disabled . I need assistance with the following:  bathing  Neuro/Psych bladder control problems numbness tingling  Prior Studies Any changes since last visit?  no  Physicians involved in your care Any changes since last visit?  no   Family History  Problem Relation Age of Onset  . Ovarian cancer Mother   . Cancer Mother   . Heart disease Mother   . Arthritis Father    History   Social History  . Marital Status: Married    Spouse Name: N/A    Number of Children: y  . Years of Education: N/A   Occupational History  . DISABLED    Social History Main Topics  . Smoking status: Never Smoker   . Smokeless tobacco: None  . Alcohol Use: None  . Drug Use: None  . Sexual Activity: None   Other Topics Concern  . None    Social History Narrative   Pt is married and lives with wife.     Past Surgical History  Procedure Laterality Date  . Colon surgery  03/2008   Past Medical History  Diagnosis Date  . Colon cancer 03/2008  . OSA (obstructive sleep apnea)   . Neuropathy   . Obesity   . Carpal tunnel syndrome   . Diabetes mellitus without complication    BP 144/81  Pulse 66  Resp 14  Wt 464 lb (210.469 kg)  SpO2 95%  Opioid Risk Score:   Fall Risk Score: Moderate Fall Risk (6-13 points) (educated and handout for fall prevention in the home given at previous visit) Review of Systems  Respiratory: Positive for apnea.   Genitourinary: Positive for difficulty urinating.  Neurological: Positive for numbness.       Tingling  All other systems reviewed and are negative.      Objective:   Physical Exam  Nursing note and vitals reviewed. Constitutional: He is oriented to person, place, and time. He appears well-developed and well-nourished.  Obese  HENT:  Head: Normocephalic and atraumatic.  Neck: Normal range of motion. Neck supple.  Cardiovascular: Normal rate and regular rhythm.   Pulmonary/Chest: Effort normal and breath sounds normal.  Musculoskeletal:  Normal Muscle Bulk: Muscle Testing Reveals: Upper Extremities: Full ROM and Muscle Strength 5/5  Arrived in [place] wheelchair. He uses his walker at home when going short distances. Back without spinal or paraspinal tenderness noted: Lower Extremities: Flexion Produces Pain into Patella. No tenderness noted with palpation  Neurological: He is alert and oriented to person, place, and time.  Skin: Skin is warm and dry.  Pitting Edema Noted to Lower Extremities 2+ Encouraged to keep feet elevated  Psychiatric: He has a normal mood and affect.          Assessment & Plan:  1. Chronic low back pain. Refilled: Hydrocodone 7.5/325mg  one tablet every 6 hours as needed #90 and MS CONTIN 30 mg one tablet three times daily. #90. Second  script was given for the following month. 2. Osteoarthritis of the knees bilaterally. Continue current medication regime. 3. Morbid obesity. Encourage exercise and start eating  healthy Living Diet regime. 4. Depression.Continue Celexa. Continue to Monitor 5. Type 2 DM with peripheral neuropathy : Increased intensity of neuropathy. RX: Gabapentin 100 mg TID. Educated on the S/S of Serotonin Syndrome. He will call the office if he notices any adverse reactions.  20 minutes of face to face patient care time was spent during this visit. All questions were encouraged and answered.  F/U in 2 months

## 2013-06-17 ENCOUNTER — Encounter (HOSPITAL_COMMUNITY): Payer: Self-pay | Admitting: Pharmacy Technician

## 2013-06-27 ENCOUNTER — Encounter (HOSPITAL_COMMUNITY): Payer: Self-pay | Admitting: *Deleted

## 2013-07-08 ENCOUNTER — Encounter (HOSPITAL_COMMUNITY): Payer: Self-pay | Admitting: *Deleted

## 2013-07-08 ENCOUNTER — Encounter (HOSPITAL_COMMUNITY): Payer: Medicare HMO | Admitting: Anesthesiology

## 2013-07-08 ENCOUNTER — Ambulatory Visit (HOSPITAL_COMMUNITY): Payer: Medicare HMO | Admitting: Anesthesiology

## 2013-07-08 ENCOUNTER — Encounter (HOSPITAL_COMMUNITY)
Admission: RE | Disposition: A | Discharge status: Home / Self Care | Payer: Self-pay | Source: Ambulatory Visit | Attending: Gastroenterology

## 2013-07-08 ENCOUNTER — Ambulatory Visit (HOSPITAL_COMMUNITY)
Admission: RE | Admit: 2013-07-08 | Discharge: 2013-07-08 | Disposition: A | Discharge status: Home / Self Care | Payer: Medicare HMO | Source: Ambulatory Visit | Attending: Gastroenterology | Admitting: Gastroenterology

## 2013-07-08 DIAGNOSIS — Z85038 Personal history of other malignant neoplasm of large intestine: Secondary | ICD-10-CM | POA: Insufficient documentation

## 2013-07-08 DIAGNOSIS — E119 Type 2 diabetes mellitus without complications: Secondary | ICD-10-CM | POA: Diagnosis not present

## 2013-07-08 DIAGNOSIS — Z538 Procedure and treatment not carried out for other reasons: Secondary | ICD-10-CM | POA: Insufficient documentation

## 2013-07-08 DIAGNOSIS — Z1211 Encounter for screening for malignant neoplasm of colon: Secondary | ICD-10-CM | POA: Diagnosis not present

## 2013-07-08 DIAGNOSIS — Z6841 Body Mass Index (BMI) 40.0 and over, adult: Secondary | ICD-10-CM | POA: Insufficient documentation

## 2013-07-08 HISTORY — PX: COLONOSCOPY WITH PROPOFOL: SHX5780

## 2013-07-08 HISTORY — DX: Allergy status to unspecified drugs, medicaments and biological substances: Z88.9

## 2013-07-08 HISTORY — DX: Other reduced mobility: Z74.09

## 2013-07-08 LAB — GLUCOSE, CAPILLARY: Glucose-Capillary: 126 mg/dL — ABNORMAL HIGH (ref 70–99)

## 2013-07-08 SURGERY — COLONOSCOPY WITH PROPOFOL
Anesthesia: Monitor Anesthesia Care

## 2013-07-08 MED ORDER — MIDAZOLAM HCL 2 MG/2ML IJ SOLN
INTRAMUSCULAR | Status: AC
  Filled 2013-07-08: qty 2

## 2013-07-08 MED ORDER — PROPOFOL 10 MG/ML IV BOLUS
INTRAVENOUS | Status: AC
Start: 2013-07-08 — End: 2013-07-08
  Filled 2013-07-08: qty 20

## 2013-07-08 MED ORDER — PROPOFOL INFUSION 10 MG/ML OPTIME
INTRAVENOUS | Status: DC | PRN
  Administered 2013-07-08: 120 ug/kg/min via INTRAVENOUS

## 2013-07-08 MED ORDER — MIDAZOLAM HCL 5 MG/5ML IJ SOLN
INTRAMUSCULAR | Status: DC | PRN
  Administered 2013-07-08 (×2): 1 mg via INTRAVENOUS

## 2013-07-08 MED ORDER — LACTATED RINGERS IV SOLN
INTRAVENOUS | Status: DC
  Administered 2013-07-08: 12:00:00 via INTRAVENOUS

## 2013-07-08 SURGICAL SUPPLY — 22 items

## 2013-07-08 NOTE — Transfer of Care (Signed)
Immediate Anesthesia Transfer of Care Note  Patient: Brian Richmond  Procedure(s) Performed: Procedure(s): COLONOSCOPY WITH PROPOFOL (N/A)  Patient Location: PACU  Anesthesia Type:MAC  Level of Consciousness: sedated  Airway & Oxygen Therapy: Patient Spontanous Breathing and Patient connected to face mask oxygen  Post-op Assessment: Report given to PACU RN and Post -op Vital signs reviewed and stable  Post vital signs: Reviewed and stable  Complications: No apparent anesthesia complications

## 2013-07-08 NOTE — Anesthesia Preprocedure Evaluation (Addendum)
Anesthesia Evaluation  Patient identified by MRN, date of birth, ID band Patient awake    Reviewed: Allergy & Precautions, H&P , NPO status , Patient's Chart, lab work & pertinent test results  Airway Mallampati: II TM Distance: >3 FB Neck ROM: Full    Dental no notable dental hx.    Pulmonary sleep apnea ,  breath sounds clear to auscultation  Pulmonary exam normal       Cardiovascular negative cardio ROS  Rhythm:Regular Rate:Normal     Neuro/Psych negative neurological ROS  negative psych ROS   GI/Hepatic negative GI ROS, Neg liver ROS,   Endo/Other  diabetes, Oral Hypoglycemic AgentsMorbid obesity  Renal/GU negative Renal ROS  negative genitourinary   Musculoskeletal negative musculoskeletal ROS (+)   Abdominal   Peds negative pediatric ROS (+)  Hematology negative hematology ROS (+)   Anesthesia Other Findings   Reproductive/Obstetrics negative OB ROS                         Anesthesia Physical Anesthesia Plan  ASA: IV  Anesthesia Plan: MAC   Post-op Pain Management:    Induction:   Airway Management Planned: Simple Face Mask  Additional Equipment:   Intra-op Plan:   Post-operative Plan:   Informed Consent: I have reviewed the patients History and Physical, chart, labs and discussed the procedure including the risks, benefits and alternatives for the proposed anesthesia with the patient or authorized representative who has indicated his/her understanding and acceptance.   Dental advisory given  Plan Discussed with: CRNA  Anesthesia Plan Comments:         Anesthesia Quick Evaluation

## 2013-07-08 NOTE — H&P (Signed)
  Procedure: Surveillance colonoscopy. 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  History: The patient is a 58 year old male born 10-06-1955. In 2010, the patient underwent a sigmoid colectomy to remove and adenocarcinoma of the sigmoid colon. He is scheduled to undergo a surveillance colonoscopy today.  Past medical history: Morbid obesity and sleep apnea syndrome. Type 2 diabetes mellitus. Allergic rhinitis. Peripheral neuropathy. Sigmoid colectomy to remove adenocarcinoma of the sigmoid colon and 2010.  Medication allergies: None  Exam: The patient is alert and lying comfortably on the endoscopy stretcher. Abdomen is nontender to palpation. Cardiac exam reveals a regular rhythm. Lungs are clear to auscultation.  Plan: Proceed with surveillance colonoscopy post-sigmoid colectomy to remove adenocarcinoma of the sigmoid colon in 2010.

## 2013-07-08 NOTE — Op Note (Signed)
Procedure: Canceled surveillance colonoscopy. Unsatisfactory colon preparation. Sigmoid colectomy for adenocarcinoma of the sigmoid colon 2010.  Endoscopist: Earle Gell  Premedication: Propofol administered by anesthesia  Procedure: The patient was placed in the left lateral decubitus position. Anal inspection was normal. The Pentax pediatric colonoscope was introduced into the rectum which was filled with solid and liquid stool. The surveillance colonoscopy was canceled because the colon preparation was unsatisfactory.  Assessment: Canceled surveillance colonoscopy due to unsatisfactory colon prep.  Plan: Schedule surveillance colonoscopy in the near future using a different colonic lavage prep.

## 2013-07-09 ENCOUNTER — Encounter (HOSPITAL_COMMUNITY): Payer: Self-pay | Admitting: Gastroenterology

## 2013-07-09 NOTE — Anesthesia Postprocedure Evaluation (Signed)
  Anesthesia Post-op Note  Patient: Brian Richmond  Procedure(s) Performed: Procedure(s) (LRB): COLONOSCOPY WITH PROPOFOL (N/A)  Patient Location: PACU  Anesthesia Type: MAC  Level of Consciousness: awake and alert   Airway and Oxygen Therapy: Patient Spontanous Breathing  Post-op Pain: mild  Post-op Assessment: Post-op Vital signs reviewed, Patient's Cardiovascular Status Stable, Respiratory Function Stable, Patent Airway and No signs of Nausea or vomiting  Last Vitals:  Filed Vitals:   07/08/13 1306  BP: 135/70  Pulse: 62  Temp:   Resp: 20    Post-op Vital Signs: stable   Complications: No apparent anesthesia complications

## 2013-08-07 ENCOUNTER — Ambulatory Visit: Payer: Medicare HMO | Admitting: Physical Medicine & Rehabilitation

## 2013-08-14 ENCOUNTER — Encounter: Payer: Medicare HMO | Attending: Physical Medicine & Rehabilitation | Admitting: Registered Nurse

## 2013-08-14 ENCOUNTER — Encounter: Payer: Self-pay | Admitting: Registered Nurse

## 2013-08-14 VITALS — BP 139/50 | HR 64 | Resp 14 | Ht 69.0 in | Wt >= 6400 oz

## 2013-08-14 DIAGNOSIS — E1149 Type 2 diabetes mellitus with other diabetic neurological complication: Secondary | ICD-10-CM

## 2013-08-14 DIAGNOSIS — M47816 Spondylosis without myelopathy or radiculopathy, lumbar region: Secondary | ICD-10-CM

## 2013-08-14 DIAGNOSIS — M1710 Unilateral primary osteoarthritis, unspecified knee: Secondary | ICD-10-CM | POA: Diagnosis present

## 2013-08-14 DIAGNOSIS — M1711 Unilateral primary osteoarthritis, right knee: Secondary | ICD-10-CM

## 2013-08-14 DIAGNOSIS — E1142 Type 2 diabetes mellitus with diabetic polyneuropathy: Secondary | ICD-10-CM

## 2013-08-14 DIAGNOSIS — M1712 Unilateral primary osteoarthritis, left knee: Secondary | ICD-10-CM

## 2013-08-14 DIAGNOSIS — M47817 Spondylosis without myelopathy or radiculopathy, lumbosacral region: Secondary | ICD-10-CM

## 2013-08-14 DIAGNOSIS — E1140 Type 2 diabetes mellitus with diabetic neuropathy, unspecified: Secondary | ICD-10-CM

## 2013-08-14 DIAGNOSIS — IMO0002 Reserved for concepts with insufficient information to code with codable children: Secondary | ICD-10-CM

## 2013-08-14 DIAGNOSIS — Z79899 Other long term (current) drug therapy: Secondary | ICD-10-CM

## 2013-08-14 DIAGNOSIS — Z5181 Encounter for therapeutic drug level monitoring: Secondary | ICD-10-CM

## 2013-08-14 MED ORDER — MORPHINE SULFATE ER 30 MG PO TBCR: 30.0000 mg | EXTENDED_RELEASE_TABLET | Freq: Three times a day (TID) | ORAL | Status: DC

## 2013-08-14 MED ORDER — HYDROCODONE-ACETAMINOPHEN 7.5-325 MG PO TABS
1.0000 | ORAL_TABLET | Freq: Four times a day (QID) | ORAL | Status: DC | PRN
Start: 2013-08-14 — End: 2013-10-15

## 2013-08-14 MED ORDER — HYDROCODONE-ACETAMINOPHEN 7.5-325 MG PO TABS: 1.0000 | ORAL_TABLET | Freq: Four times a day (QID) | ORAL | Status: DC | PRN

## 2013-08-14 NOTE — Progress Notes (Signed)
Subjective:    Patient ID: Brian Richmond, male    DOB: [birth year], 58 y.o.   MRN: [id number]  HPI: [name] is a 58 year old male who returns for follow up for chronic pain and medication refill. He says his pain is located in his lower extremities, and bilateral knees. He rates his pain 7. He does not follow a exercise regime. He says he walks in his home short distances. He was encouraged again to start following an exercise program with chair exercises. He verbalizes understanding.  He arrived to the office in a motorized wheelchair.  Pain Inventory Average Pain 6 Pain Right Now 6 My pain is constant, dull and aching  In the last 24 hours, has pain interfered with the following? General activity 8 Relation with others 8 Enjoyment of life 8 What TIME of day is your pain at its worst? night Sleep (in general) Fair  Pain is worse with: walking and standing Pain improves with: rest Relief from Meds: 6  Mobility walk with assistance use a walker how many minutes can you walk? 2 do you drive?  yes use a wheelchair  Function disabled: date disabled na I need assistance with the following:  bathing  Neuro/Psych bladder control problems numbness tingling  Prior Studies Any changes since last visit?  no  Physicians involved in your care Any changes since last visit?  no   Family History  Problem Relation Age of Onset  . Ovarian cancer Mother   . Cancer Mother   . Heart disease Mother   . Arthritis Father    History   Social History  . Marital Status: Married    Spouse Name: N/A    Number of Children: y  . Years of Education: N/A   Occupational History  . DISABLED    Social History Main Topics  . Smoking status: Never Smoker   . Smokeless tobacco: None  . Alcohol Use: No  . Drug Use: No  . Sexual Activity: Yes   Other Topics Concern  . None   Social History Narrative   Pt is married and lives with wife.     Past Surgical History    Procedure Laterality Date  . Colon surgery  03/2008  . Colonoscopy with propofol N/A 07/08/2013    Procedure: COLONOSCOPY WITH PROPOFOL;  Surgeon: [name], MD;  Location: [facility];  Service: Endoscopy;  Laterality: N/A;   Past Medical History  Diagnosis Date  . Neuropathy   . Obesity     BMI 68.0 kg  . Diabetes mellitus without complication   . Colon cancer 03/2008    surgery only  . Carpal tunnel syndrome     bilateral, and neuropathy bilateral feet  . Hx of seasonal allergies   . Reduced mobility     06-27-13 states can transfer self.limited on distances-uses walker and wheelchair mostly.  . OSA (obstructive sleep apnea)     uses cpap nightly   BP 139/50  Pulse 64  Resp 14  Ht 5\' 9"  (1.753 m)  Wt 462 lb (209.562 kg)  BMI 68.19 kg/m2  SpO2 93%  Opioid Risk Score:   Fall Risk Score: Moderate Fall Risk (6-13 points) (pt educated on fall risk brochure given to pt previously)    Review of Systems  Respiratory: Positive for apnea.   Gastrointestinal: Positive for constipation.  Genitourinary:       Bladder control problems  Musculoskeletal: Positive for back pain.  Neurological: Positive  for numbness.       Tingling  All other systems reviewed and are negative.      Objective:   Physical Exam  Nursing note and vitals reviewed. Constitutional: He is oriented to person, place, and time. He appears well-developed and well-nourished.  HENT:  Head: Normocephalic and atraumatic.  Neck: Normal range of motion. Neck supple.  Cardiovascular: Normal rate, regular rhythm and normal heart sounds.   Pulmonary/Chest: Effort normal and breath sounds normal.  Musculoskeletal:  Normal Muscle Bulk and Muscle Testing Reveals: Upper Extremities: Full ROM and Muscle Strength 5/5 Lower Extremities: Full ROM and Muscle Strength 5/5 Lower Extremities Flexion Produces Pain into Bilateral Knee Arrived in Motorized Wheelchair  Neurological: He is alert and oriented to person,  place, and time.  Skin: Skin is warm and dry.  Psychiatric: He has a normal mood and affect.          Assessment & Plan:  1. Chronic low back pain. Refilled: Hydrocodone 7.5/325mg  one tablet every 6 hours as needed #90 and MS CONTIN 30 mg one tablet three times daily. #90. Second script was given for the following month.  2. Osteoarthritis of the knees bilaterally. Continue current medication regime.  3. Morbid obesity. Encourage exercise and start eating healthy Living Diet regime.  4. Depression.Continue Celexa. Continue to Monitor  5. Type 2 DM with peripheral neuropathy : Continue with Gabapentin 100 mg TID.  20 minutes of face to face patient care time was spent during this visit. All questions were encouraged and answered.   F/U in 2 months

## 2013-08-15 ENCOUNTER — Ambulatory Visit: Payer: Medicare HMO | Admitting: Physical Medicine & Rehabilitation

## 2013-10-15 ENCOUNTER — Encounter: Payer: Self-pay | Admitting: Physical Medicine & Rehabilitation

## 2013-10-15 ENCOUNTER — Encounter: Payer: Medicare HMO | Attending: Physical Medicine & Rehabilitation | Admitting: Physical Medicine & Rehabilitation

## 2013-10-15 VITALS — BP 120/65 | HR 63 | Resp 14 | Ht 69.0 in | Wt >= 6400 oz

## 2013-10-15 DIAGNOSIS — M1710 Unilateral primary osteoarthritis, unspecified knee: Secondary | ICD-10-CM | POA: Diagnosis present

## 2013-10-15 DIAGNOSIS — M47816 Spondylosis without myelopathy or radiculopathy, lumbar region: Secondary | ICD-10-CM

## 2013-10-15 DIAGNOSIS — M1711 Unilateral primary osteoarthritis, right knee: Secondary | ICD-10-CM

## 2013-10-15 DIAGNOSIS — M47817 Spondylosis without myelopathy or radiculopathy, lumbosacral region: Secondary | ICD-10-CM | POA: Diagnosis not present

## 2013-10-15 DIAGNOSIS — M25511 Pain in right shoulder: Secondary | ICD-10-CM

## 2013-10-15 DIAGNOSIS — M1712 Unilateral primary osteoarthritis, left knee: Secondary | ICD-10-CM

## 2013-10-15 DIAGNOSIS — M47896 Other spondylosis, lumbar region: Secondary | ICD-10-CM

## 2013-10-15 DIAGNOSIS — M25519 Pain in unspecified shoulder: Secondary | ICD-10-CM

## 2013-10-15 DIAGNOSIS — Z5181 Encounter for therapeutic drug level monitoring: Secondary | ICD-10-CM

## 2013-10-15 MED ORDER — HYDROCODONE-ACETAMINOPHEN 7.5-325 MG PO TABS: 1.0000 | ORAL_TABLET | Freq: Four times a day (QID) | ORAL | Status: DC | PRN

## 2013-10-15 MED ORDER — DICLOFENAC SODIUM 1 % TD GEL: 1.0000 "application " | Freq: Three times a day (TID) | TRANSDERMAL | Status: DC

## 2013-10-15 MED ORDER — MORPHINE SULFATE ER 30 MG PO TBCR: 30.0000 mg | EXTENDED_RELEASE_TABLET | Freq: Three times a day (TID) | ORAL | Status: DC

## 2013-10-15 MED ORDER — MORPHINE SULFATE ER 30 MG PO TBCR
30.0000 mg | EXTENDED_RELEASE_TABLET | Freq: Three times a day (TID) | ORAL | Status: DC
Start: 2013-10-15 — End: 2013-10-15

## 2013-10-15 NOTE — Patient Instructions (Signed)
Acromioclavicular Injuries °The AC (acromioclavicular) joint is the joint in the shoulder where the collarbone (clavicle) meets the shoulder blade (scapula). The part of the shoulder blade connected to the collarbone is called the acromion. Common problems with and treatments for the AC joint are detailed below. °ARTHRITIS °Arthritis occurs when the joint has been injured and the smooth padding between the joints (cartilage) is lost. This is the wear and tear seen in most joints of the body if they have been overused. This causes the joint to produce pain and swelling which is worse with activity.  °AC JOINT SEPARATION °AC joint separation means that the ligaments connecting the acromion of the shoulder blade and collarbone have been damaged, and the two bones no longer line up. AC separations can be anywhere from mild to severe, and are "graded" depending upon which ligaments are torn and how badly they are torn. °· Grade I Injury: the least damage is done, and the AC joint still lines up. °· Grade II Injury: damage to the ligaments which reinforce the AC joint. In a Grade II injury, these ligaments are stretched but not entirely torn. When stressed, the AC joint becomes painful and unstable. °· Grade III Injury: AC and secondary ligaments are completely torn, and the collarbone is no longer attached to the shoulder blade. This results in deformity; a prominence of the end of the clavicle. °AC JOINT FRACTURE °AC joint fracture means that there has been a break in the bones of the AC joint, usually the end of the clavicle. °TREATMENT °TREATMENT OF AC ARTHRITIS °· There is currently no way to replace the cartilage damaged by arthritis. The best way to improve the condition is to decrease the activities which aggravate the problem. Application of ice to the joint helps decrease pain and soreness (inflammation). The use of non-steroidal anti-inflammatory medication is helpful. °· If less conservative measures do not  work, then cortisone shots (injections) may be used. These are anti-inflammatories; they decrease the soreness in the joint and swelling. °· If non-surgical measures fail, surgery may be recommended. The procedure is generally removal of a portion of the end of the clavicle. This is the part of the collarbone closest to your acromion which is stabilized with ligaments to the acromion of the shoulder blade. This surgery may be performed using a tube-like instrument with a light (arthroscope) for looking into a joint. It may also be performed as an open surgery through a small incision by the surgeon. Most patients will have good range of motion within 6 weeks and may return to all activity including sports by 8-12 weeks, barring complications. °TREATMENT OF AN AC SEPARATION °· The initial treatment is to decrease pain. This is best accomplished by immobilizing the arm in a sling and placing an ice pack to the shoulder for 20 to 30 minutes every 2 hours as needed. As the pain starts to subside, it is important to begin moving the fingers, wrist, elbow and eventually the shoulder in order to prevent a stiff or "frozen" shoulder. Instruction on when and how much to move the shoulder will be provided by your caregiver. The length of time needed to regain full motion and function depends on the amount or grade of the injury. Recovery from a Grade I AC separation usually takes 10 to 14 days, whereas a Grade III may take 6 to 8 weeks. °· Grade I and II separations usually do not require surgery. Even Grade III injuries usually allow return to full   activity with few restrictions. Treatment is also based on the activity demands of the injured shoulder. For example, a high level quarterback with an injured throwing arm will receive more aggressive treatment than someone with a desk job who rarely uses his/her arm for strenuous activities. In some cases, a painful lump may persist which could require a later surgery. Surgery  can be very successful, but the benefits must be weighed against the potential risks. °TREATMENT OF AN AC JOINT FRACTURE °Fracture treatment depends on the type of fracture. Sometimes a splint or sling may be all that is required. Other times surgery may be required for repair. This is more frequently the case when the ligaments supporting the clavicle are completely torn. Your caregiver will help you with these decisions and together you can decide what will be the best treatment. °HOME CARE INSTRUCTIONS  °· Apply ice to the injury for 15-20 minutes each hour while awake for 2 days. Put the ice in a plastic bag and place a towel between the bag of ice and skin. °· If a sling has been applied, wear it constantly for as long as directed by your caregiver, even at night. The sling or splint can be removed for bathing or showering or as directed. Be sure to keep the shoulder in the same place as when the sling is on. Do not lift the arm. °· If a figure-of-eight splint has been applied it should be tightened gently by another person every day. Tighten it enough to keep the shoulders held back. Allow enough room to place the index finger between the body and strap. Loosen the splint immediately if there is numbness or tingling in the hands. °· Take over-the-counter or prescription medicines for pain, discomfort or fever as directed by your caregiver. °· If you or your child has received a follow up appointment, it is very important to keep that appointment in order to avoid long term complications, chronic pain or disability. °SEEK MEDICAL CARE IF:  °· The pain is not relieved with medications. °· There is increased swelling or discoloration that continues to get worse rather than better. °· You or your child has been unable to follow up as instructed. °· There is progressive numbness and tingling in the arm, forearm or hand. °SEEK IMMEDIATE MEDICAL CARE IF:  °· The arm is numb, cold or pale. °· There is increasing pain  in the hand, forearm or fingers. °MAKE SURE YOU:  °· Understand these instructions. °· Will watch your condition. °· Will get help right away if you are not doing well or get worse. °Document Released: 10/12/2004 Document Revised: 03/27/2011 Document Reviewed: 04/06/2008 °[employer]® Patient Information ©2015 [employer]. This information is not intended to replace advice given to you by your health care provider. Make sure you discuss any questions you have with your health care provider. ° °

## 2013-10-15 NOTE — Progress Notes (Signed)
Subjective:    Patient ID: Brian Richmond, male    DOB: 1955-12-10, 57 y.o.   MRN: [id number]  HPI  Brian Richmond is back regarding his chronic pain. Things have been fairly stable since I last saw him. He continues on his current medications. They continue to work fairly well for him.  He notes some new pain in his right shoulder near his "collar bone". It sometimes bothers him when he sleeps at night. He tries to sleep on his back but only makes it a couple hours before he has to readust.   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  Pain Inventory Average Pain 7 Pain Right Now 6 My pain is constant, burning, tingling and aching  In the last 24 hours, has pain interfered with the following? General activity 9 Relation with others 9 Enjoyment of life 9 What TIME of day is your pain at its worst? morning and night Sleep (in general) Fair  Pain is worse with: walking and standing Pain improves with: rest, heat/ice and medication Relief from Meds: 5  Mobility use a wheelchair  Function disabled: date disabled 2002  Neuro/Psych bladder control problems numbness tingling  Prior Studies Any changes since last visit?  no  Physicians involved in your care Any changes since last visit?  no   Family History  Problem Relation Age of Onset  . Ovarian cancer Mother   . Cancer Mother   . Heart disease Mother   . Arthritis Father    History   Social History  . Marital Status: Married    Spouse Name: N/A    Number of Children: y  . Years of Education: N/A   Occupational History  . DISABLED    Social History Main Topics  . Smoking status: Never Smoker   . Smokeless tobacco: None  . Alcohol Use: No  . Drug Use: No  . Sexual Activity: Yes   Other Topics Concern  . None   Social History Narrative   Pt is married and lives with wife.     Past Surgical History  Procedure Laterality Date  . Colon surgery   03/2008  . Colonoscopy with propofol N/A 07/08/2013    Procedure: COLONOSCOPY WITH PROPOFOL;  Surgeon: [name], MD;  Location: [facility];  Service: Endoscopy;  Laterality: N/A;   Past Medical History  Diagnosis Date  . Neuropathy   . Obesity     BMI 68.0 kg  . Diabetes mellitus without complication   . Colon cancer 03/2008    surgery only  . Carpal tunnel syndrome     bilateral, and neuropathy bilateral feet  . Hx of seasonal allergies   . Reduced mobility     06-27-13 states can transfer self.limited on distances-uses walker and wheelchair mostly.  . OSA (obstructive sleep apnea)     uses cpap nightly   BP 120/65  Pulse 63  Resp 14  Ht 5\' 9"  (1.753 m)  Wt 465 lb (210.923 kg)  BMI 68.64 kg/m2  SpO2 95%  Opioid Risk Score:   Fall Risk Score:    Review of Systems     Objective:   Physical Exam  Constitutional: He is oriented to person, place, and time. He appears well-developed and well-nourished. Has long hair and beard  Morbidly obese ---weight stable  HENT:  Head: Normocephalic and atraumatic.  Mouth/Throat: Oropharynx is clear and moist.  Eyes: Conjunctivae and EOM are normal.  Pupils are equal, round, and reactive to light.  Neck: Normal range of motion.  Cardiovascular: Normal rate and regular rhythm.  Pulmonary/Chest: Effort normal and breath sounds normal. No respiratory distress. He has no wheezes.  Abdominal: Soft. He exhibits no distension.  Musculoskeletal:  Knees with effusion and crepitus with ROM. Lumbar back: He exhibits decreased range of motion and tenderness. Exam limited by size/wheelcair Neurological: He is alert and oriented to person, place, and time. Decreased PP and LT in feet. Psychiatric: His behavior is normal. Judgment and thought content normal.  Affect is bright.    Assessment & Plan:   ASSESSMENT:  1. Chronic low back pain.  2. Osteoarthritis of the knees bilaterally.  3. Morbid obesity.  4. Depression.  5. Type 2 DM  with peripheral neuropathy  6. Right shoulder pain. ?mild right AC arthritis   PLAN:  1. I rx'ed 2 prescriptions each for this month and next month, one for  Vicodin 7.5/325 one p.o. q.8 hours p.r.n., 90 with no refill. and MS Contin CR 15 mg 3 every 12 hours, 90 with no refills. Pt has been stable and consistent with his medications. Second rx'es were given for next month  2. voltaren gel, ice. Relative rest right shoulder. Information was provided on [facility] jt arthritis as well   3. Continue with his celexa as it helps his mood.  4. miralax daily for bowels.    Fifteen minutes of face to face patient care time were spent during this visit. All questions were encouraged and answered.  Follow up in 2 months with NP        Assessment & Plan:

## 2013-10-16 NOTE — Addendum Note (Signed)
Addended by: Caro Hight on: 10/16/2013 10:16 AM   Modules accepted: Orders

## 2013-10-31 ENCOUNTER — Other Ambulatory Visit: Payer: Self-pay

## 2013-12-05 ENCOUNTER — Encounter: Payer: Self-pay | Admitting: Pulmonary Disease

## 2013-12-05 ENCOUNTER — Ambulatory Visit (INDEPENDENT_AMBULATORY_CARE_PROVIDER_SITE_OTHER): Payer: Commercial Managed Care - HMO | Admitting: Pulmonary Disease

## 2013-12-05 VITALS — BP 124/72 | HR 61 | Temp 97.0°F | Ht 69.0 in | Wt >= 6400 oz

## 2013-12-05 DIAGNOSIS — G4733 Obstructive sleep apnea (adult) (pediatric): Secondary | ICD-10-CM

## 2013-12-05 NOTE — Assessment & Plan Note (Addendum)
The patient is wearing his CPAP device compliantly, but is due for a new mask and supplies. His sleep is typically disrupted quite a bit by chronic pain, but he feels that he is doing as well as he has in the past with the C Pap device. I have encouraged him to keep up with his mask cushion changes, and to try to work on weight loss is much as he can. I will see him back in one year.

## 2013-12-05 NOTE — Progress Notes (Signed)
   Subjective:    Patient ID: Brian Richmond, male    DOB: [birth year], 57 y.o.   MRN: [id number]  HPI The patient comes in today for follow-up of his obstructive sleep apnea. He is wearing C Pap compliantly, and is having no issues with his mask fit or pressure. He is due for a new mask and supplies, and is unhappy with his home care company. He feels that he is sleeping about as good as he can, since chronic pain is his main disrupter of sleep. Of note, his weight is up since last visit.   Review of Systems  Constitutional: Negative for fever and unexpected weight change.  HENT: Negative for congestion, dental problem, ear pain, nosebleeds, postnasal drip, rhinorrhea, sinus pressure, sneezing, sore throat and trouble swallowing.   Eyes: Negative for redness and itching.  Respiratory: Negative for cough, chest tightness, shortness of breath and wheezing.   Cardiovascular: Negative for palpitations and leg swelling.  Gastrointestinal: Negative for nausea and vomiting.  Genitourinary: Negative for dysuria.  Musculoskeletal: Negative for joint swelling.  Skin: Negative for rash.  Neurological: Negative for headaches.  Hematological: Does not bruise/bleed easily.  Psychiatric/Behavioral: Negative for dysphoric mood. The patient is not nervous/anxious.        Objective:   Physical Exam Morbidly obese male in no acute distress Nose without purulence or discharge noted Neck without lymphadenopathy or thyromegaly No skin breakdown or pressure necrosis from the C Pap mask Lower extremities with significant edema, no cyanosis Alert and oriented, moves all 4 extremities.       Assessment & Plan:

## 2013-12-05 NOTE — Patient Instructions (Signed)
Will get you referred to a different home care company, and send an order for new supplies. Work on weight reduction as much as you can.  followup with me again in one year.

## 2013-12-15 ENCOUNTER — Encounter: Payer: Commercial Managed Care - HMO | Attending: Physical Medicine & Rehabilitation | Admitting: Registered Nurse

## 2013-12-15 ENCOUNTER — Encounter: Payer: Self-pay | Admitting: Registered Nurse

## 2013-12-15 VITALS — BP 131/46 | HR 60 | Resp 14

## 2013-12-15 DIAGNOSIS — M1711 Unilateral primary osteoarthritis, right knee: Secondary | ICD-10-CM

## 2013-12-15 DIAGNOSIS — M47816 Spondylosis without myelopathy or radiculopathy, lumbar region: Secondary | ICD-10-CM | POA: Diagnosis present

## 2013-12-15 DIAGNOSIS — Z5181 Encounter for therapeutic drug level monitoring: Secondary | ICD-10-CM

## 2013-12-15 DIAGNOSIS — M1712 Unilateral primary osteoarthritis, left knee: Secondary | ICD-10-CM | POA: Diagnosis present

## 2013-12-15 DIAGNOSIS — Z79899 Other long term (current) drug therapy: Secondary | ICD-10-CM

## 2013-12-15 MED ORDER — MORPHINE SULFATE ER 30 MG PO TBCR: 30.0000 mg | EXTENDED_RELEASE_TABLET | Freq: Three times a day (TID) | ORAL | Status: DC

## 2013-12-15 MED ORDER — HYDROCODONE-ACETAMINOPHEN 7.5-325 MG PO TABS: 1.0000 | ORAL_TABLET | Freq: Four times a day (QID) | ORAL | Status: DC | PRN

## 2013-12-15 NOTE — Progress Notes (Signed)
Subjective:    Patient ID: Brian Richmond, male    DOB: September 22, 1955, 58 y.o.   MRN: [id number]  HPI: [name] is a 58 year old male who returns for follow up for chronic pain and medication refill. He says his pain is located in his bilateral shoulders, bilateral hips, lower extremities, and bilateral knees. He rates his pain 6. His current exercise regime is using his walker in the home. He arrived to the office in a motorized wheelchair.  Pain Inventory Average Pain 6 Pain Right Now 6 My pain is constant, tingling and aching  In the last 24 hours, has pain interfered with the following? General activity 9 Relation with others 9 Enjoyment of life 9 What TIME of day is your pain at its worst? morning,night Sleep (in general) Fair  Pain is worse with: walking and standing Pain improves with: rest and medication Relief from Meds: 5  Mobility use a walker how many minutes can you walk? 0 ability to climb steps?  yes do you drive?  yes use a wheelchair  Function disabled: date disabled .  Neuro/Psych bladder control problems numbness tingling  Prior Studies Any changes since last visit?  no  Physicians involved in your care Any changes since last visit?  no   Family History  Problem Relation Age of Onset  . Ovarian cancer Mother   . Cancer Mother   . Heart disease Mother   . Arthritis Father    History   Social History  . Marital Status: Married    Spouse Name: N/A    Number of Children: y  . Years of Education: N/A   Occupational History  . DISABLED    Social History Main Topics  . Smoking status: Never Smoker   . Smokeless tobacco: None  . Alcohol Use: No  . Drug Use: No  . Sexual Activity: Yes   Other Topics Concern  . None   Social History Narrative   Pt is married and lives with wife.     Past Surgical History  Procedure Laterality Date  . Colon surgery  03/2008  . Colonoscopy with propofol N/A 07/08/2013    Procedure:  COLONOSCOPY WITH PROPOFOL;  Surgeon: [name], MD;  Location: [facility];  Service: Endoscopy;  Laterality: N/A;   Past Medical History  Diagnosis Date  . Neuropathy   . Obesity     BMI 68.0 kg  . Diabetes mellitus without complication   . Colon cancer 03/2008    surgery only  . Carpal tunnel syndrome     bilateral, and neuropathy bilateral feet  . Hx of seasonal allergies   . Reduced mobility     06-27-13 states can transfer self.limited on distances-uses walker and wheelchair mostly.  . OSA (obstructive sleep apnea)     uses cpap nightly   There were no vitals taken for this visit.  Opioid Risk Score:   Fall Risk Score: Moderate Fall Risk (6-13 points) (pt has received falll prevention pamplet) Review of Systems  Respiratory: Positive for apnea.   Gastrointestinal: Positive for constipation.  Genitourinary:       Bladder control problems Urine retention   Neurological: Positive for numbness.       Tingling   All other systems reviewed and are negative.      Objective:   Physical Exam  Constitutional: He is oriented to person, place, and time. He appears well-developed and well-nourished.  HENT:  Head: Normocephalic and atraumatic.  Neck: Normal range of motion. Neck supple.  Cardiovascular: Normal rate and regular rhythm.   Pulmonary/Chest: Effort normal and breath sounds normal.  Musculoskeletal:  Normal Muscle Bulk and Muscle Testing Reveals: Upper Extremities: Full ROM and Muscle strength 5/5 Lower extremities: Full ROM and Muscle Strength 5/5 Right Leg Flexion Produces pain into Patella Left Leg Flexion Produces pain into Patella Arrived in Motorized Wheelchair   Neurological: He is alert and oriented to person, place, and time.  Skin: Skin is warm and dry.  Psychiatric: He has a normal mood and affect.  Nursing note and vitals reviewed.         Assessment & Plan:  1. Chronic low back pain. Refilled: Hydrocodone 7.5/325mg  one tablet every 6  hours as needed #90 and MS CONTIN 30 mg one tablet three times daily. #90. Second script was given for the following month.  2. Osteoarthritis of the knees bilaterally. Continue current medication regime.  3. Morbid obesity. Encourage exercise and start eating healthy Living Diet regime.  4. Depression.Continue Celexa. Continue to Monitor  5. Type 2 DM with peripheral neuropathy : Continue to Monitor  15 minutes of face to face patient care time was spent during this visit. All questions were encouraged and answered.   F/U in 2 months

## 2014-02-11 ENCOUNTER — Encounter: Payer: Self-pay | Admitting: Registered Nurse

## 2014-02-11 ENCOUNTER — Encounter: Payer: Commercial Managed Care - HMO | Attending: Physical Medicine & Rehabilitation | Admitting: Registered Nurse

## 2014-02-11 VITALS — BP 138/62 | HR 74 | Resp 14

## 2014-02-11 DIAGNOSIS — M1711 Unilateral primary osteoarthritis, right knee: Secondary | ICD-10-CM

## 2014-02-11 DIAGNOSIS — M47816 Spondylosis without myelopathy or radiculopathy, lumbar region: Secondary | ICD-10-CM | POA: Diagnosis not present

## 2014-02-11 DIAGNOSIS — M1712 Unilateral primary osteoarthritis, left knee: Secondary | ICD-10-CM | POA: Insufficient documentation

## 2014-02-11 DIAGNOSIS — Z5181 Encounter for therapeutic drug level monitoring: Secondary | ICD-10-CM | POA: Diagnosis not present

## 2014-02-11 DIAGNOSIS — Z79899 Other long term (current) drug therapy: Secondary | ICD-10-CM | POA: Diagnosis not present

## 2014-02-11 MED ORDER — MORPHINE SULFATE ER 30 MG PO TBCR: 30.0000 mg | EXTENDED_RELEASE_TABLET | Freq: Three times a day (TID) | ORAL | Status: DC

## 2014-02-11 MED ORDER — HYDROCODONE-ACETAMINOPHEN 7.5-325 MG PO TABS: 1.0000 | ORAL_TABLET | Freq: Four times a day (QID) | ORAL | Status: DC | PRN

## 2014-02-11 NOTE — Progress Notes (Signed)
Subjective:    Patient ID: Brian Richmond, male    DOB: [birth year], 59 y.o.   MRN: [id number]  HPI: [name] is a 59 year old male who returns for follow up for chronic pain and medication refill. He says his pain is located in his collar bones, lower extremities, and bilateral knees. He rates his pain 7. His current exercise regime is using his walker in the home. He arrived to the office in a motorized wheelchair.  Pain Inventory Average Pain 7 Pain Right Now 7 My pain is burning and aching  In the last 24 hours, has pain interfered with the following? General activity 7 Relation with others 7 Enjoyment of life 7 What TIME of day is your pain at its worst? morning, night Sleep (in general) Fair  Pain is worse with: walking and standing Pain improves with: rest and medication Relief from Meds: 6  Mobility walk with assistance use a walker how many minutes can you walk? 0 ability to climb steps?  yes do you drive?  yes use a wheelchair  Function disabled: date disabled .  Neuro/Psych bladder control problems numbness  Prior Studies Any changes since last visit?  no  Physicians involved in your care Any changes since last visit?  no   Family History  Problem Relation Age of Onset  . Ovarian cancer Mother   . Cancer Mother   . Heart disease Mother   . Arthritis Father    History   Social History  . Marital Status: Married    Spouse Name: N/A    Number of Children: y  . Years of Education: N/A   Occupational History  . DISABLED    Social History Main Topics  . Smoking status: Never Smoker   . Smokeless tobacco: None  . Alcohol Use: No  . Drug Use: No  . Sexual Activity: Yes   Other Topics Concern  . None   Social History Narrative   Pt is married and lives with wife.     Past Surgical History  Procedure Laterality Date  . Colon surgery  03/2008  . Colonoscopy with propofol N/A 07/08/2013    Procedure: COLONOSCOPY WITH PROPOFOL;   Surgeon: [name], MD;  Location: [facility];  Service: Endoscopy;  Laterality: N/A;   Past Medical History  Diagnosis Date  . Neuropathy   . Obesity     BMI 68.0 kg  . Diabetes mellitus without complication   . Colon cancer 03/2008    surgery only  . Carpal tunnel syndrome     bilateral, and neuropathy bilateral feet  . Hx of seasonal allergies   . Reduced mobility     06-27-13 states can transfer self.limited on distances-uses walker and wheelchair mostly.  . OSA (obstructive sleep apnea)     uses cpap nightly   BP 138/62 mmHg  Pulse 74  Resp 14  SpO2 97%  Opioid Risk Score:   Fall Risk Score: Moderate Fall Risk (6-13 points)  Review of Systems  Genitourinary:       Bladder control problems   Neurological: Positive for numbness.  All other systems reviewed and are negative.      Objective:   Physical Exam  Constitutional: He is oriented to person, place, and time. He appears well-developed and well-nourished.  Morbid Obese  HENT:  Head: Normocephalic and atraumatic.  Neck: Normal range of motion. Neck supple.  Cardiovascular: Normal rate and regular rhythm.   Pulmonary/Chest: Effort normal  and breath sounds normal.  Musculoskeletal: He exhibits edema.  Normal Muscle Bulk and Muscle Testing Reveals: Upper Extremities: Full ROM and Muscle strength 5/5 Right Clavicle tenderness Lower Extremities: Full ROM and Muscle strength 5/5 Arrived in Motorized wheelchair  Neurological: He is alert and oriented to person, place, and time.  Skin: Skin is warm and dry.  Psychiatric: He has a normal mood and affect.  Nursing note and vitals reviewed.         Assessment & Plan:  1. Chronic low back pain. Refilled: Hydrocodone 7.5/325mg  one tablet every 6 hours as needed #90 and MS CONTIN 30 mg one tablet three times daily. #90. Second script was given for the following month.  2. Osteoarthritis of the knees bilaterally. Continue current medication regime.  3.  Morbid obesity. Encourage exercise and start eating healthy Living Diet regime.  4. Depression.Continue Celexa. Continue to Monitor  5. Type 2 DM with peripheral neuropathy : Continue to Monitor  15 minutes of face to face patient care time was spent during this visit. All questions were encouraged and answered.

## 2014-02-12 DIAGNOSIS — D649 Anemia, unspecified: Secondary | ICD-10-CM | POA: Diagnosis not present

## 2014-02-23 DIAGNOSIS — R358 Other polyuria: Secondary | ICD-10-CM | POA: Diagnosis not present

## 2014-02-23 DIAGNOSIS — E291 Testicular hypofunction: Secondary | ICD-10-CM | POA: Diagnosis not present

## 2014-02-23 DIAGNOSIS — N401 Enlarged prostate with lower urinary tract symptoms: Secondary | ICD-10-CM | POA: Diagnosis not present

## 2014-02-23 DIAGNOSIS — T387X5D Adverse effect of androgens and anabolic congeners, subsequent encounter: Secondary | ICD-10-CM | POA: Diagnosis not present

## 2014-02-23 DIAGNOSIS — N138 Other obstructive and reflux uropathy: Secondary | ICD-10-CM | POA: Diagnosis not present

## 2014-04-06 ENCOUNTER — Encounter: Payer: Self-pay | Admitting: Physical Medicine & Rehabilitation

## 2014-04-06 ENCOUNTER — Encounter
Payer: Commercial Managed Care - HMO | Attending: Physical Medicine & Rehabilitation | Admitting: Physical Medicine & Rehabilitation

## 2014-04-06 ENCOUNTER — Other Ambulatory Visit: Payer: Self-pay | Admitting: Physical Medicine & Rehabilitation

## 2014-04-06 VITALS — BP 130/45 | HR 56 | Resp 14

## 2014-04-06 DIAGNOSIS — M1711 Unilateral primary osteoarthritis, right knee: Secondary | ICD-10-CM | POA: Diagnosis not present

## 2014-04-06 DIAGNOSIS — M1712 Unilateral primary osteoarthritis, left knee: Secondary | ICD-10-CM

## 2014-04-06 DIAGNOSIS — M47816 Spondylosis without myelopathy or radiculopathy, lumbar region: Secondary | ICD-10-CM | POA: Diagnosis not present

## 2014-04-06 DIAGNOSIS — Z79899 Other long term (current) drug therapy: Secondary | ICD-10-CM | POA: Diagnosis not present

## 2014-04-06 DIAGNOSIS — Z5181 Encounter for therapeutic drug level monitoring: Secondary | ICD-10-CM | POA: Diagnosis not present

## 2014-04-06 MED ORDER — OXYCODONE-ACETAMINOPHEN 7.5-325 MG PO TABS: 1.0000 | ORAL_TABLET | Freq: Four times a day (QID) | ORAL | Status: DC | PRN

## 2014-04-06 MED ORDER — MORPHINE SULFATE ER 30 MG PO TBCR: 30.0000 mg | EXTENDED_RELEASE_TABLET | Freq: Three times a day (TID) | ORAL | Status: DC

## 2014-04-06 NOTE — Progress Notes (Signed)
Subjective:    Patient ID: Brian Richmond, male    DOB: May 03, 1955, 59 y.o.   MRN: [id number]  HPI  Brian Richmond is back regarding his chronic pain. He has had more difficulties sleeping over the last few months as he can't find a position where he can get comfortable. If he rolls on his side his hip or knee hurts, if he lays on his back his low back hurts.   He has been on the current regimen of ms contin 30mg  tid and hydrocodone 7.5 q8 prn for some time.   His mood is good. He's had no new GI issues. His BM's are regular. Things are going fairly well at home. He is now [employer].   Pain Inventory Average Pain 7 Pain Right Now 7 My pain is constant, tingling and aching  In the last 24 hours, has pain interfered with the following? General activity 9 Relation with others 9 Enjoyment of life 9 What TIME of day is your pain at its worst? no ans Sleep (in general) Poor  Pain is worse with: walking and standing Pain improves with: rest and medication Relief from Meds: 6  Mobility use a walker ability to climb steps?  no do you drive?  yes use a wheelchair  Function disabled: date disabled .  Neuro/Psych bladder control problems numbness tingling  Prior Studies Any changes since last visit?  no  Physicians involved in your care Any changes since last visit?  no   Family History  Problem Relation Age of Onset  . Ovarian cancer Mother   . Cancer Mother   . Heart disease Mother   . Arthritis Father    History   Social History  . Marital Status: Married    Spouse Name: N/A  . Number of Children: y  . Years of Education: N/A   Occupational History  . DISABLED    Social History Main Topics  . Smoking status: Never Smoker   . Smokeless tobacco: Not on file  . Alcohol Use: No  . Drug Use: No  . Sexual Activity: Yes   Other Topics Concern  . None   Social History Narrative   Pt is married and lives with wife.     Past Surgical History    Procedure Laterality Date  . Colon surgery  03/2008  . Colonoscopy with propofol N/A 07/08/2013    Procedure: COLONOSCOPY WITH PROPOFOL;  Surgeon: [name], MD;  Location: [facility];  Service: Endoscopy;  Laterality: N/A;   Past Medical History  Diagnosis Date  . Neuropathy   . Obesity     BMI 68.0 kg  . Diabetes mellitus without complication   . Colon cancer 03/2008    surgery only  . Carpal tunnel syndrome     bilateral, and neuropathy bilateral feet  . Hx of seasonal allergies   . Reduced mobility     06-27-13 states can transfer self.limited on distances-uses walker and wheelchair mostly.  . OSA (obstructive sleep apnea)     uses cpap nightly   BP 130/45 mmHg  Pulse 56  Resp 14  SpO2 96%  Opioid Risk Score:   Fall Risk Score: Moderate Fall Risk (6-13 points) (previously educated and given handout)`1  Depression screen PHQ 2/9  Depression screen PHQ 2/9 04/06/2014  Decreased Interest 2  Down, Depressed, Hopeless 2  PHQ - 2 Score 4  Altered sleeping 3  Tired, decreased energy 2  Change in appetite 0  Feeling bad  or failure about yourself  1  Trouble concentrating 1  Moving slowly or fidgety/restless 1  Suicidal thoughts 0  PHQ-9 Score 12     Review of Systems  Respiratory: Positive for apnea.   Gastrointestinal: Positive for constipation.  Genitourinary: Positive for difficulty urinating.  Neurological: Positive for numbness.       Tingling  All other systems reviewed and are negative.      Objective:   Physical Exam  Constitutional: He is oriented to person, place, and time. He appears well-developed and well-nourished. Has long hair and beard  Morbidly obese ---weight stable  HENT:  Head: Normocephalic and atraumatic.  Mouth/Throat: Oropharynx is clear and moist.  Eyes: Conjunctivae and EOM are normal. Pupils are equal, round, and reactive to light.  Neck: Normal range of motion.  Cardiovascular: Normal rate and regular rhythm.   Pulmonary/Chest: Effort normal and breath sounds normal. No respiratory distress. He has no wheezes.  Abdominal: Soft. He exhibits no distension.  Musculoskeletal:  Knees with effusion and crepitus with ROM.  Hips are tender.  Lumbar back: He exhibits decreased range of motion and tenderness. Exam limited by size/wheelcair Neurological: He is alert and oriented to person, place, and time. Decreased PP and LT in feet. Psychiatric: His behavior is normal. Judgment and thought content normal.  Affect is bright and appropriate. .  Assessment & Plan:   ASSESSMENT:  1. Chronic low back pain.  2. Osteoarthritis of the knees bilaterally.  3. Morbid obesity.  4. Depression.  5. Type 2 DM with peripheral neuropathy  6. Right shoulder pain. ?mild right AC arthritis    PLAN:  1. I rx'ed 2 prescriptions each for this month and next month, one for  Percocet 7.5/325 one p.o. q.8 hours p.r.n., 90 with no refill. and MS Contin CR 15 mg 3 every 12 hours, 90 with no refills. Pt has been stable and consistent with his medications. Second rx'es were given for next month. If there are any issues with his percocet, he'll need to return for an appt to make any changes. 2. voltaren gel, ice. Relative rest right shoulder. Information was provided on [facility] jt arthritis as well  3. Continue with his celexa as it helps his mood.  4. miralax daily for bowels. He has remained fairly consistent with this.   Fifteen minutes of face to face patient care time were spent during this visit. All questions were encouraged and answered.  Follow up in 2 months with NP

## 2014-04-07 LAB — PMP ALCOHOL METABOLITE (ETG): Ethyl Glucuronide (EtG): NEGATIVE ng/mL

## 2014-04-11 LAB — OPIATES/OPIOIDS (LC/MS-MS)
CODEINE URINE: NEGATIVE ng/mL (ref <50)
HYDROCODONE: 507 ng/mL (ref <50)
Hydromorphone: 213 ng/mL (ref <50)
Morphine Urine: 20546 ng/mL (ref <50)
NORHYDROCODONE, UR: 1181 ng/mL (ref <50)
Noroxycodone, Ur: NEGATIVE ng/mL (ref <50)
OXYCODONE, UR: NEGATIVE ng/mL (ref <50)
OXYMORPHONE, URINE: NEGATIVE ng/mL (ref <50)

## 2014-04-14 LAB — PRESCRIPTION MONITORING PROFILE (SOLSTAS)
Amphetamine/Meth: NEGATIVE ng/mL
Barbiturate Screen, Urine: NEGATIVE ng/mL
Benzodiazepine Screen, Urine: NEGATIVE ng/mL
Buprenorphine, Urine: NEGATIVE ng/mL
COCAINE METABOLITES: NEGATIVE ng/mL
Cannabinoid Scrn, Ur: NEGATIVE ng/mL
Carisoprodol, Urine: NEGATIVE ng/mL
Creatinine, Urine: 95.66 mg/dL (ref >20.0)
Fentanyl, Ur: NEGATIVE ng/mL
MDMA URINE: NEGATIVE ng/mL
Meperidine, Ur: NEGATIVE ng/mL
Methadone Screen, Urine: NEGATIVE ng/mL
NITRITES URINE, INITIAL: NEGATIVE ug/mL
Oxycodone Screen, Ur: NEGATIVE ng/mL
Propoxyphene: NEGATIVE ng/mL
TAPENTADOLUR: NEGATIVE ng/mL
TRAMADOL UR: NEGATIVE ng/mL
ZOLPIDEM, URINE: NEGATIVE ng/mL
pH, Initial: 7.1 pH (ref 4.5–8.9)

## 2014-04-15 ENCOUNTER — Other Ambulatory Visit: Payer: Self-pay | Admitting: Physical Medicine & Rehabilitation

## 2014-04-22 NOTE — Progress Notes (Signed)
Urine drug screen for this encounter is consistent for prescribed medication 

## 2014-06-01 DIAGNOSIS — M129 Arthropathy, unspecified: Secondary | ICD-10-CM | POA: Diagnosis not present

## 2014-06-01 DIAGNOSIS — G5600 Carpal tunnel syndrome, unspecified upper limb: Secondary | ICD-10-CM | POA: Diagnosis not present

## 2014-06-01 DIAGNOSIS — E669 Obesity, unspecified: Secondary | ICD-10-CM | POA: Diagnosis not present

## 2014-06-01 DIAGNOSIS — E119 Type 2 diabetes mellitus without complications: Secondary | ICD-10-CM | POA: Diagnosis not present

## 2014-06-02 ENCOUNTER — Ambulatory Visit: Payer: Commercial Managed Care - HMO | Admitting: Physical Medicine & Rehabilitation

## 2014-06-03 ENCOUNTER — Encounter: Payer: Commercial Managed Care - HMO | Admitting: Registered Nurse

## 2014-06-10 ENCOUNTER — Encounter: Payer: Commercial Managed Care - HMO | Attending: Physical Medicine & Rehabilitation | Admitting: Registered Nurse

## 2014-06-10 ENCOUNTER — Encounter: Payer: Self-pay | Admitting: Registered Nurse

## 2014-06-10 VITALS — BP 126/82 | HR 66 | Resp 14

## 2014-06-10 DIAGNOSIS — M47816 Spondylosis without myelopathy or radiculopathy, lumbar region: Secondary | ICD-10-CM | POA: Diagnosis not present

## 2014-06-10 DIAGNOSIS — M1712 Unilateral primary osteoarthritis, left knee: Secondary | ICD-10-CM

## 2014-06-10 DIAGNOSIS — Z5181 Encounter for therapeutic drug level monitoring: Secondary | ICD-10-CM

## 2014-06-10 DIAGNOSIS — M1711 Unilateral primary osteoarthritis, right knee: Secondary | ICD-10-CM

## 2014-06-10 DIAGNOSIS — Z79899 Other long term (current) drug therapy: Secondary | ICD-10-CM

## 2014-06-10 MED ORDER — MORPHINE SULFATE ER 30 MG PO TBCR: 30.0000 mg | EXTENDED_RELEASE_TABLET | Freq: Three times a day (TID) | ORAL | Status: DC

## 2014-06-10 MED ORDER — OXYCODONE-ACETAMINOPHEN 7.5-325 MG PO TABS: 1.0000 | ORAL_TABLET | Freq: Four times a day (QID) | ORAL | Status: DC | PRN

## 2014-06-10 NOTE — Progress Notes (Signed)
Subjective:    Patient ID: Brian Richmond, male    DOB: 04-09-1955, 59 y.o.   MRN: [id number]  HPI: [name] is a 59 year old male who returns for follow up for chronic pain and medication refill. He says his pain is located in his bilateral hip's and bilateral knees. He rates his pain 7. His current exercise regime is using his walker in the home. He arrived to the office in a motorized wheelchair. He also stated he has been having increased oral secretions since his denta work up, encouraged to call his dentist. He's using magic mouthwash at this time. He verbalizes understanding.  Pain Inventory Average Pain 7 Pain Right Now 7 My pain is constant, burning, dull, tingling and aching  In the last 24 hours, has pain interfered with the following? General activity 9 Relation with others 9 Enjoyment of life 9 What TIME of day is your pain at its worst? evening Sleep (in general) Fair  Pain is worse with: walking and standing Pain improves with: rest and medication Relief from Meds: 6  Mobility how many minutes can you walk? 1-2 ability to climb steps?  no do you drive?  yes use a wheelchair  Function disabled: date disabled .  Neuro/Psych numbness tingling trouble walking  Prior Studies Any changes since last visit?  no  Physicians involved in your care Any changes since last visit?  no   Family History  Problem Relation Age of Onset  . Ovarian cancer Mother   . Cancer Mother   . Heart disease Mother   . Arthritis Father    History   Social History  . Marital Status: Married    Spouse Name: N/A  . Number of Children: y  . Years of Education: N/A   Occupational History  . DISABLED    Social History Main Topics  . Smoking status: Never Smoker   . Smokeless tobacco: Not on file  . Alcohol Use: No  . Drug Use: No  . Sexual Activity: Yes   Other Topics Concern  . None   Social History Narrative   Pt is married and lives with wife.      Past Surgical History  Procedure Laterality Date  . Colon surgery  03/2008  . Colonoscopy with propofol N/A 07/08/2013    Procedure: COLONOSCOPY WITH PROPOFOL;  Surgeon: [name], MD;  Location: [facility];  Service: Endoscopy;  Laterality: N/A;   Past Medical History  Diagnosis Date  . Neuropathy   . Obesity     BMI 68.0 kg  . Diabetes mellitus without complication   . Colon cancer 03/2008    surgery only  . Carpal tunnel syndrome     bilateral, and neuropathy bilateral feet  . Hx of seasonal allergies   . Reduced mobility     06-27-13 states can transfer self.limited on distances-uses walker and wheelchair mostly.  . OSA (obstructive sleep apnea)     uses cpap nightly   BP 126/82 mmHg  Pulse 66  Resp 14  SpO2 94%  Opioid Risk Score:   Fall Risk Score: Moderate Fall Risk (6-13 points)`1  Depression screen PHQ 2/9  Depression screen PHQ 2/9 04/06/2014  Decreased Interest 2  Down, Depressed, Hopeless 2  PHQ - 2 Score 4  Altered sleeping 3  Tired, decreased energy 2  Change in appetite 0  Feeling bad or failure about yourself  1  Trouble concentrating 1  Moving slowly or fidgety/restless 1  Suicidal thoughts 0  PHQ-9 Score 12     Review of Systems  Constitutional: Negative.   HENT: Negative.   Eyes: Negative.   Respiratory:       Sleep apnea w/CPAP  Cardiovascular: Negative.   Gastrointestinal: Positive for constipation.  Endocrine: Negative.   Genitourinary: Positive for dysuria.       Bladder control problems  Musculoskeletal: Positive for myalgias, back pain and arthralgias.       Bilateral leg pain  Skin: Positive for rash.       breakdown  Allergic/Immunologic: Negative.   Neurological: Positive for numbness.       Tingling  Hematological: Negative.   Psychiatric/Behavioral: Negative.        Objective:   Physical Exam  Constitutional: He is oriented to person, place, and time. He appears well-developed and well-nourished.  Morbid  Obese  HENT:  Head: Normocephalic and atraumatic.  Neck: Normal range of motion. Neck supple.  Cardiovascular: Normal rate and regular rhythm.   Pulmonary/Chest: Effort normal and breath sounds normal.  Musculoskeletal: He exhibits edema.  Normal Muscle Bulk and Muscle Testing Reveals: Upper Extremities: Full ROM and Muscle Strength 5/5 Bilateral Greater Trochanteric Tenderness Lower Extremities: Full ROM and Muscle Strength 5/5 Bilateral Lower Extremities Flexion Produces pain into Patella Arrived in Motorized Wheelchair  Neurological: He is alert and oriented to person, place, and time.  Skin: Skin is warm and dry.  Psychiatric: He has a normal mood and affect.  Nursing note and vitals reviewed.         Assessment & Plan:  1. Chronic low back pain. Refilled: oxycodone 7.5/325mg  one tablet every 6 hours as needed #90 and MS CONTIN 30 mg one tablet three times daily. #90. Second script was given for the following month.  2. Osteoarthritis of the knees bilaterally. Continue current medication regime. Voltaren Gel sample given 3. Morbid obesity. Encourage exercise and start eating healthy Living Diet regime.  4. Depression.Continue Celexa. Continue to Monitor  5. Type 2 DM with peripheral neuropathy : Continue to Monitor  15 minutes of face to face patient care time was spent during this visit. All questions were encouraged and answered

## 2014-06-17 ENCOUNTER — Telehealth: Payer: Self-pay | Admitting: Pulmonary Disease

## 2014-06-17 NOTE — Telephone Encounter (Signed)
Called [facility] and was told they faxed over a physicians written order that needs to be signed by [facility] for pt to get his supplies. This is being faxed to triage. Will await fax

## 2014-06-19 ENCOUNTER — Telehealth: Payer: Self-pay | Admitting: Pulmonary Disease

## 2014-06-19 NOTE — Telephone Encounter (Signed)
Form signed and faxed back.

## 2014-06-19 NOTE — Telephone Encounter (Signed)
Form placed in [facility] green folder. Please advise thanks 

## 2014-06-19 NOTE — Telephone Encounter (Signed)
Encounter closed by accident ---------------------------------------------------  Forms received in triage, put in [facility] box for signature  To Mindy for follow up            Lamoni at 06/17/2014 11:39 AM     Status: Signed       Expand All Collapse All   Called [facility] and was told they faxed over a physicians written order that needs to be signed by [facility] for pt to get his supplies. This is being faxed to triage. Will await fax

## 2014-06-19 NOTE — Telephone Encounter (Signed)
Forms received in triage, put in [facility] box for signature  To Mindy for follow up

## 2014-06-24 DIAGNOSIS — I10 Essential (primary) hypertension: Secondary | ICD-10-CM | POA: Diagnosis not present

## 2014-06-24 DIAGNOSIS — E1140 Type 2 diabetes mellitus with diabetic neuropathy, unspecified: Secondary | ICD-10-CM | POA: Diagnosis not present

## 2014-06-24 DIAGNOSIS — G4730 Sleep apnea, unspecified: Secondary | ICD-10-CM | POA: Diagnosis not present

## 2014-06-24 DIAGNOSIS — E782 Mixed hyperlipidemia: Secondary | ICD-10-CM | POA: Diagnosis not present

## 2014-07-13 ENCOUNTER — Other Ambulatory Visit: Payer: Self-pay

## 2014-08-11 ENCOUNTER — Encounter: Payer: Self-pay | Admitting: Registered Nurse

## 2014-08-11 ENCOUNTER — Encounter: Payer: Commercial Managed Care - HMO | Attending: Physical Medicine & Rehabilitation | Admitting: Registered Nurse

## 2014-08-11 VITALS — BP 140/80 | HR 70 | Resp 14

## 2014-08-11 DIAGNOSIS — M25562 Pain in left knee: Secondary | ICD-10-CM

## 2014-08-11 DIAGNOSIS — Z79899 Other long term (current) drug therapy: Secondary | ICD-10-CM | POA: Diagnosis not present

## 2014-08-11 DIAGNOSIS — M47816 Spondylosis without myelopathy or radiculopathy, lumbar region: Secondary | ICD-10-CM | POA: Diagnosis not present

## 2014-08-11 DIAGNOSIS — M1711 Unilateral primary osteoarthritis, right knee: Secondary | ICD-10-CM

## 2014-08-11 DIAGNOSIS — Z5181 Encounter for therapeutic drug level monitoring: Secondary | ICD-10-CM

## 2014-08-11 DIAGNOSIS — M1712 Unilateral primary osteoarthritis, left knee: Secondary | ICD-10-CM | POA: Diagnosis not present

## 2014-08-11 DIAGNOSIS — G894 Chronic pain syndrome: Secondary | ICD-10-CM | POA: Diagnosis not present

## 2014-08-11 DIAGNOSIS — M25561 Pain in right knee: Secondary | ICD-10-CM

## 2014-08-11 MED ORDER — MORPHINE SULFATE ER 30 MG PO TBCR: 30.0000 mg | EXTENDED_RELEASE_TABLET | Freq: Three times a day (TID) | ORAL | Status: DC

## 2014-08-11 MED ORDER — OXYCODONE-ACETAMINOPHEN 7.5-325 MG PO TABS: 1.0000 | ORAL_TABLET | Freq: Four times a day (QID) | ORAL | Status: DC | PRN

## 2014-08-11 NOTE — Progress Notes (Signed)
Subjective:    Patient ID: Brian Richmond, male    DOB: 1955/11/29, 59 y.o.   MRN: [id number]  HPI: [name] is a 59 year old male who returns for follow up for chronic pain and medication refill. He says his pain is located in his lower extremities and bilateral knees. Also states the pain has increased in intensity into lower extremities impacting his ambulation at times  He rates his pain 7. He's not following a current exercise regime  Due to pain he states. Encouraged to start chair exercises and verbalizes understanding. He is using his walker in the home. He arrived in motorized wheelchair.  Pain Inventory Average Pain 7 Pain Right Now 7 My pain is constant, burning, dull and aching  In the last 24 hours, has pain interfered with the following? General activity 10 Relation with others 10 Enjoyment of life 10 What TIME of day is your pain at its worst? evening Sleep (in general) Fair  Pain is worse with: walking, inactivity, standing and some activites Pain improves with: rest and medication Relief from Meds: 6  Mobility how many minutes can you walk? 1 ability to climb steps?  no do you drive?  yes use a wheelchair  Function disabled: date disabled .  Neuro/Psych bladder control problems weakness numbness tingling  Prior Studies Any changes since last visit?  no  Physicians involved in your care Any changes since last visit?  no   Family History  Problem Relation Age of Onset  . Ovarian cancer Mother   . Cancer Mother   . Heart disease Mother   . Arthritis Father    History   Social History  . Marital Status: Married    Spouse Name: N/A  . Number of Children: y  . Years of Education: N/A   Occupational History  . DISABLED    Social History Main Topics  . Smoking status: Never Smoker   . Smokeless tobacco: Not on file  . Alcohol Use: No  . Drug Use: No  . Sexual Activity: Yes   Other Topics Concern  . None   Social History  Narrative   Pt is married and lives with wife.     Past Surgical History  Procedure Laterality Date  . Colon surgery  03/2008  . Colonoscopy with propofol N/A 07/08/2013    Procedure: COLONOSCOPY WITH PROPOFOL;  Surgeon: [name], MD;  Location: [facility];  Service: Endoscopy;  Laterality: N/A;   Past Medical History  Diagnosis Date  . Neuropathy   . Obesity     BMI 68.0 kg  . Diabetes mellitus without complication   . Colon cancer 03/2008    surgery only  . Carpal tunnel syndrome     bilateral, and neuropathy bilateral feet  . Hx of seasonal allergies   . Reduced mobility     06-27-13 states can transfer self.limited on distances-uses walker and wheelchair mostly.  . OSA (obstructive sleep apnea)     uses cpap nightly   Pulse 70  Resp 14  SpO2 95%  Opioid Risk Score:   Fall Risk Score:  `1  Depression screen PHQ 2/9  Depression screen PHQ 2/9 04/06/2014  Decreased Interest 2  Down, Depressed, Hopeless 2  PHQ - 2 Score 4  Altered sleeping 3  Tired, decreased energy 2  Change in appetite 0  Feeling bad or failure about yourself  1  Trouble concentrating 1  Moving slowly or fidgety/restless 1  Suicidal  thoughts 0  PHQ-9 Score 12     Review of Systems  HENT: Negative.   Eyes: Negative.   Respiratory: Negative.        Sleep apnea W/CPAP  Cardiovascular: Negative.   Gastrointestinal: Positive for constipation.       Bladder control problems  Endocrine: Negative.   Genitourinary: Positive for dysuria.  Musculoskeletal: Positive for myalgias, back pain and arthralgias.       Bilateral neuropathic foot pain  Skin: Positive for rash.  Allergic/Immunologic: Negative.   Neurological: Positive for weakness and numbness.  Hematological: Negative.   Psychiatric/Behavioral: Negative.        Objective:   Physical Exam  Constitutional: He is oriented to person, place, and time. He appears well-developed and well-nourished.  HENT:  Head: Normocephalic and  atraumatic.  Neck: Normal range of motion. Neck supple.  Cardiovascular: Normal rate and regular rhythm.   Pulmonary/Chest: Effort normal and breath sounds normal.  Musculoskeletal:  Normal Muscle Bulk and Muscle Testing Reveals: Upper Extremities: Full ROM and Muscle Strength 5/5 Thoracic Paraspinal Tenderness: T-1- T-3 Lower Extremities: Full ROM and Muscle Strength 5/5 Bilateral Lower Extremities Flexion Produces pain into Patella's Arrived via Motorized Wheelchair   Neurological: He is alert and oriented to person, place, and time.  Skin: Skin is warm and dry.  Psychiatric: He has a normal mood and affect.  Nursing note and vitals reviewed.         Assessment & Plan:  1. Chronic low back pain. Refilled: oxycodone 7.5/325mg  one tablet every 6 hours as needed #90 and MS CONTIN 30 mg one tablet three times daily. #90. Second script was given for the following month.  2. Osteoarthritis of the knees bilaterally. Continue current medication regime. Voltaren Gel sample given 3. Morbid obesity. Encourage exercise and start eating healthy Living Diet regime.  4. Depression.Continue Celexa. Continue to Monitor  5. Type 2 DM with peripheral neuropathy : Continue to Monitor  15 minutes of face to face patient care time was spent during this visit. All questions were encouraged and answered

## 2014-08-12 ENCOUNTER — Other Ambulatory Visit: Payer: Self-pay | Admitting: Registered Nurse

## 2014-08-12 DIAGNOSIS — G894 Chronic pain syndrome: Secondary | ICD-10-CM | POA: Diagnosis not present

## 2014-08-12 DIAGNOSIS — Z79899 Other long term (current) drug therapy: Secondary | ICD-10-CM | POA: Diagnosis not present

## 2014-08-12 DIAGNOSIS — Z5181 Encounter for therapeutic drug level monitoring: Secondary | ICD-10-CM | POA: Diagnosis not present

## 2014-08-13 LAB — PMP ALCOHOL METABOLITE (ETG): Ethyl Glucuronide (EtG): NEGATIVE ng/mL

## 2014-08-14 ENCOUNTER — Telehealth: Payer: Self-pay | Admitting: *Deleted

## 2014-08-14 MED ORDER — GABAPENTIN 100 MG PO CAPS: 100.0000 mg | ORAL_CAPSULE | Freq: Three times a day (TID) | ORAL | Status: DC

## 2014-08-14 NOTE — Telephone Encounter (Signed)
Shana called to speak to [place].  He says his gabpentin 100 mg tid , rx has expired.  Order sent.  It went to [facility].  I asked him to call back if he wants it sent somewhere else.  They will likely request 90 day supply but it was sent with 30 day before I realized it went to the mail order pharmacy.

## 2014-08-17 LAB — PRESCRIPTION MONITORING PROFILE (SOLSTAS)
Amphetamine/Meth: NEGATIVE ng/mL
BARBITURATE SCREEN, URINE: NEGATIVE ng/mL
Benzodiazepine Screen, Urine: NEGATIVE ng/mL
Buprenorphine, Urine: NEGATIVE ng/mL
CANNABINOID SCRN UR: NEGATIVE ng/mL
CARISOPRODOL, URINE: NEGATIVE ng/mL
Cocaine Metabolites: NEGATIVE ng/mL
Creatinine, Urine: 190.85 mg/dL (ref >20.0)
ECSTASY: NEGATIVE ng/mL
Fentanyl, Ur: NEGATIVE ng/mL
Meperidine, Ur: NEGATIVE ng/mL
Methadone Screen, Urine: NEGATIVE ng/mL
Nitrites, Initial: NEGATIVE ug/mL
PH URINE, INITIAL: 5.3 pH (ref 4.5–8.9)
Propoxyphene: NEGATIVE ng/mL
TAPENTADOLUR: NEGATIVE ng/mL
Tramadol Scrn, Ur: NEGATIVE ng/mL
Zolpidem, Urine: NEGATIVE ng/mL

## 2014-08-17 LAB — OPIATES/OPIOIDS (LC/MS-MS)
Codeine Urine: NEGATIVE ng/mL (ref <50)
Hydrocodone: NEGATIVE ng/mL (ref <50)
Hydromorphone: 104 ng/mL (ref <50)
MORPHINE: 25728 ng/mL (ref <50)
Norhydrocodone, Ur: NEGATIVE ng/mL (ref <50)
Noroxycodone, Ur: 5837 ng/mL (ref <50)
OXYCODONE, UR: 2480 ng/mL (ref <50)
OXYMORPHONE, URINE: 1432 ng/mL (ref <50)

## 2014-08-17 LAB — OXYCODONE, URINE (LC/MS-MS)
NOROXYCODONE, UR: 5837 ng/mL (ref <50)
Oxycodone, ur: 2480 ng/mL (ref <50)
Oxymorphone: 1432 ng/mL (ref <50)

## 2014-08-18 DIAGNOSIS — E291 Testicular hypofunction: Secondary | ICD-10-CM | POA: Diagnosis not present

## 2014-08-18 DIAGNOSIS — N401 Enlarged prostate with lower urinary tract symptoms: Secondary | ICD-10-CM | POA: Diagnosis not present

## 2014-08-18 DIAGNOSIS — N138 Other obstructive and reflux uropathy: Secondary | ICD-10-CM | POA: Diagnosis not present

## 2014-08-19 ENCOUNTER — Other Ambulatory Visit: Payer: Self-pay | Admitting: Gastroenterology

## 2014-08-20 ENCOUNTER — Encounter (HOSPITAL_COMMUNITY): Payer: Self-pay | Admitting: *Deleted

## 2014-09-22 DIAGNOSIS — E291 Testicular hypofunction: Secondary | ICD-10-CM | POA: Diagnosis not present

## 2014-09-22 DIAGNOSIS — N138 Other obstructive and reflux uropathy: Secondary | ICD-10-CM | POA: Diagnosis not present

## 2014-09-22 DIAGNOSIS — R300 Dysuria: Secondary | ICD-10-CM | POA: Diagnosis not present

## 2014-09-22 DIAGNOSIS — N401 Enlarged prostate with lower urinary tract symptoms: Secondary | ICD-10-CM | POA: Diagnosis not present

## 2014-09-23 NOTE — Progress Notes (Signed)
Urine drug screen for this encounter is consistent for prescribed medication 

## 2014-10-05 ENCOUNTER — Encounter (HOSPITAL_COMMUNITY): Payer: Self-pay | Admitting: *Deleted

## 2014-10-12 ENCOUNTER — Encounter: Payer: Commercial Managed Care - HMO | Admitting: Physical Medicine & Rehabilitation

## 2014-10-12 DIAGNOSIS — Z1211 Encounter for screening for malignant neoplasm of colon: Secondary | ICD-10-CM | POA: Diagnosis not present

## 2014-10-13 ENCOUNTER — Encounter (HOSPITAL_COMMUNITY): Payer: Self-pay

## 2014-10-13 ENCOUNTER — Encounter (HOSPITAL_COMMUNITY)
Admission: RE | Disposition: A | Discharge status: Home / Self Care | Payer: Self-pay | Source: Ambulatory Visit | Attending: Gastroenterology

## 2014-10-13 ENCOUNTER — Ambulatory Visit (HOSPITAL_COMMUNITY)
Admission: RE | Admit: 2014-10-13 | Discharge: 2014-10-13 | Disposition: A | Discharge status: Home / Self Care | Payer: Commercial Managed Care - HMO | Source: Ambulatory Visit | Attending: Gastroenterology | Admitting: Gastroenterology

## 2014-10-13 ENCOUNTER — Ambulatory Visit (HOSPITAL_COMMUNITY): Payer: Commercial Managed Care - HMO | Admitting: Anesthesiology

## 2014-10-13 DIAGNOSIS — G4733 Obstructive sleep apnea (adult) (pediatric): Secondary | ICD-10-CM | POA: Diagnosis not present

## 2014-10-13 DIAGNOSIS — Z85038 Personal history of other malignant neoplasm of large intestine: Secondary | ICD-10-CM | POA: Diagnosis not present

## 2014-10-13 DIAGNOSIS — D649 Anemia, unspecified: Secondary | ICD-10-CM | POA: Diagnosis not present

## 2014-10-13 DIAGNOSIS — E119 Type 2 diabetes mellitus without complications: Secondary | ICD-10-CM | POA: Diagnosis not present

## 2014-10-13 DIAGNOSIS — I10 Essential (primary) hypertension: Secondary | ICD-10-CM | POA: Diagnosis not present

## 2014-10-13 DIAGNOSIS — E1140 Type 2 diabetes mellitus with diabetic neuropathy, unspecified: Secondary | ICD-10-CM | POA: Diagnosis not present

## 2014-10-13 DIAGNOSIS — Z6841 Body Mass Index (BMI) 40.0 and over, adult: Secondary | ICD-10-CM | POA: Diagnosis not present

## 2014-10-13 DIAGNOSIS — Z1211 Encounter for screening for malignant neoplasm of colon: Secondary | ICD-10-CM | POA: Diagnosis not present

## 2014-10-13 HISTORY — DX: Essential (primary) hypertension: I10

## 2014-10-13 HISTORY — PX: COLONOSCOPY WITH PROPOFOL: SHX5780

## 2014-10-13 LAB — GLUCOSE, CAPILLARY: Glucose-Capillary: 122 mg/dL — ABNORMAL HIGH (ref 65–99)

## 2014-10-13 SURGERY — COLONOSCOPY WITH PROPOFOL
Anesthesia: Monitor Anesthesia Care

## 2014-10-13 MED ORDER — ONDANSETRON HCL 4 MG/2ML IJ SOLN
INTRAMUSCULAR | Status: DC | PRN
  Administered 2014-10-13: 4 mg via INTRAVENOUS

## 2014-10-13 MED ORDER — SODIUM CHLORIDE 0.9 % IV SOLN: INTRAVENOUS | Status: DC

## 2014-10-13 MED ORDER — PROPOFOL 10 MG/ML IV BOLUS
INTRAVENOUS | Status: AC
  Filled 2014-10-13: qty 20

## 2014-10-13 MED ORDER — ONDANSETRON HCL 4 MG/2ML IJ SOLN
INTRAMUSCULAR | Status: AC
  Filled 2014-10-13: qty 2

## 2014-10-13 MED ORDER — LACTATED RINGERS IV SOLN
INTRAVENOUS | Status: DC
  Administered 2014-10-13: 1000 mL via INTRAVENOUS

## 2014-10-13 MED ORDER — PROPOFOL 500 MG/50ML IV EMUL
INTRAVENOUS | Status: DC | PRN
  Administered 2014-10-13: 300 ug/kg/min via INTRAVENOUS

## 2014-10-13 SURGICAL SUPPLY — 22 items

## 2014-10-13 NOTE — Anesthesia Postprocedure Evaluation (Signed)
  Anesthesia Post-op Note  Patient: Brian Richmond  Procedure(s) Performed: Procedure(s): COLONOSCOPY WITH PROPOFOL (N/A)  Patient Location: PACU and Endoscopy Unit  Anesthesia Type:MAC  Level of Consciousness: awake  Airway and Oxygen Therapy: Patient Spontanous Breathing  Post-op Pain: mild  Post-op Assessment: Post-op Vital signs reviewed              Post-op Vital Signs: Reviewed  Last Vitals:  Filed Vitals:   10/13/14 1117  BP: 149/86  Pulse: 61  Temp:   Resp: 16    Complications: No apparent anesthesia complications

## 2014-10-13 NOTE — Transfer of Care (Signed)
Immediate Anesthesia Transfer of Care Note  Patient: Brian Richmond  Procedure(s) Performed: Procedure(s): COLONOSCOPY WITH PROPOFOL (N/A)  Patient Location: PACU  Anesthesia Type:MAC  Level of Consciousness:  sedated, patient cooperative and responds to stimulation  Airway & Oxygen Therapy:Patient Spontanous Breathing and Patient connected to face mask oxgen  Post-op Assessment:  Report given to PACU RN and Post -op Vital signs reviewed and stable  Post vital signs:  Reviewed and stable  Last Vitals:  Filed Vitals:   10/13/14 0920  BP: 180/70  Pulse: 78  Resp: 16    Complications: No apparent anesthesia complications

## 2014-10-13 NOTE — Discharge Instructions (Signed)
Colonoscopy, Care After °Refer to this sheet in the next few weeks. These instructions provide you with information on caring for yourself after your procedure. Your health care provider may also give you more specific instructions. Your treatment has been planned according to current medical practices, but problems sometimes occur. Call your health care provider if you have any problems or questions after your procedure. °WHAT TO EXPECT AFTER THE PROCEDURE  °After your procedure, it is typical to have the following: °· A small amount of blood in your stool. °· Moderate amounts of gas and mild abdominal cramping or bloating. °HOME CARE INSTRUCTIONS °· Do not drive, operate machinery, or sign important documents for 24 hours. °· You may shower and resume your regular physical activities, but move at a slower pace for the first 24 hours. °· Take frequent rest periods for the first 24 hours. °· Walk around or put a warm pack on your abdomen to help reduce abdominal cramping and bloating. °· Drink enough fluids to keep your urine clear or pale yellow. °· You may resume your normal diet as instructed by your health care provider. Avoid heavy or fried foods that are hard to digest. °· Avoid drinking alcohol for 24 hours or as instructed by your health care provider. °· Only take over-the-counter or prescription medicines as directed by your health care provider. °· If a tissue sample (biopsy) was taken during your procedure: °· Do not take aspirin or blood thinners for 7 days, or as instructed by your health care provider. °· Do not drink alcohol for 7 days, or as instructed by your health care provider. °· Eat soft foods for the first 24 hours. °SEEK MEDICAL CARE IF: °You have persistent spotting of blood in your stool 2-3 days after the procedure. °SEEK IMMEDIATE MEDICAL CARE IF: °· You have more than a small spotting of blood in your stool. °· You pass large blood clots in your stool. °· Your abdomen is swollen  (distended). °· You have nausea or vomiting. °· You have a fever. °· You have increasing abdominal pain that is not relieved with medicine. °Document Released: 08/17/2003 Document Revised: 10/23/2012 Document Reviewed: 09/09/2012 °ExitCare® Patient Information ©2015 [employer]. This information is not intended to replace advice given to you by your health care provider. Make sure you discuss any questions you have with your health care provider. ° °Monitored Anesthesia Care °Monitored anesthesia care is an anesthesia service for a medical procedure. Anesthesia is the loss of the ability to feel pain. It is produced by medicines called anesthetics. It may affect a small area of your body (local anesthesia), a large area of your body (regional anesthesia), or your entire body (general anesthesia). The need for monitored anesthesia care depends your procedure, your condition, and the potential need for regional or general anesthesia. It is often provided during procedures where:  °· General anesthesia may be needed if there are complications. This is because you need special care when you are under general anesthesia.   °· You will be under local or regional anesthesia. This is so that you are able to have higher levels of anesthesia if needed.   °· You will receive calming medicines (sedatives). This is especially the case if sedatives are given to put you in a semi-conscious state of relaxation (deep sedation). This is because the amount of sedative needed to produce this state can be hard to predict. Too much of a sedative can produce general anesthesia. °Monitored anesthesia care is performed by one   or more health care providers who have special training in all types of anesthesia. You will need to meet with these health care providers before your procedure. During this meeting, they will ask you about your medical history. They will also give you instructions to follow. (For example, you will need to stop  eating and drinking before your procedure. You may also need to stop or change medicines you are taking.) During your procedure, your health care providers will stay with you. They will:  °· Watch your condition. This includes watching your blood pressure, breathing, and level of pain.   °· Diagnose and treat problems that occur.   °· Give medicines if they are needed. These may include calming medicines (sedatives) and anesthetics.   °· Make sure you are comfortable.   °Having monitored anesthesia care does not necessarily mean that you will be under anesthesia. It does mean that your health care providers will be able to manage anesthesia if you need it or if it occurs. It also means that you will be able to have a different type of anesthesia than you are having if you need it. When your procedure is complete, your health care providers will continue to watch your condition. They will make sure any medicines wear off before you are allowed to go home.  °Document Released: 09/28/2004 Document Revised: 05/19/2013 Document Reviewed: 02/14/2012 °ExitCare® Patient Information ©2015 [employer]. This information is not intended to replace advice given to you by your health care provider. Make sure you discuss any questions you have with your health care provider. ° ° °

## 2014-10-13 NOTE — Op Note (Signed)
Procedure: Surveillance colonoscopy. 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 Sigmoid colon surgery performed in 2010.  Endoscopist: Earle Gell  Premedication: Propofol administered by anesthesia  Procedure: The patient was placed in the left lateral decubitus position. The Pentax pediatric colonoscope was introduced into the rectum and advanced to the cecum. A normal-appearing ileocecal valve and appendiceal orifice were identified. Colonic preparation for the exam today was satisfactory after vigorous water lavage of the colon. Withdrawal time was 20 minutes  Rectum. Normal. Retroflex view of the distal rectum was normal  Sigmoid colon and descending colon. Normal  Splenic flexure. Normal  Transverse colon. Normal  Hepatic flexure. Normal  Ascending colon. Normal  Cecum and ileocecal valve. Normal  Assessment: Normal surveillance colonoscopy  Recommendation: Schedule repeat colonoscopy in 3 years

## 2014-10-13 NOTE — Anesthesia Preprocedure Evaluation (Addendum)
Anesthesia Evaluation  Patient identified by MRN, date of birth, ID band Patient awake    Reviewed: Allergy & Precautions, NPO status , Patient's Chart, lab work & pertinent test results  Airway Mallampati: II  TM Distance: >3 FB Neck ROM: Full    Dental   Pulmonary sleep apnea ,    breath sounds clear to auscultation       Cardiovascular hypertension,  Rhythm:Regular Rate:Normal     Neuro/Psych    GI/Hepatic negative GI ROS, Neg liver ROS,   Endo/Other  diabetes  Renal/GU negative Renal ROS     Musculoskeletal   Abdominal   Peds  Hematology   Anesthesia Other Findings   Reproductive/Obstetrics                           Anesthesia Physical Anesthesia Plan  ASA: III  Anesthesia Plan: MAC   Post-op Pain Management:    Induction: Intravenous  Airway Management Planned: Nasal Cannula  Additional Equipment:   Intra-op Plan:   Post-operative Plan:   Informed Consent: I have reviewed the patients History and Physical, chart, labs and discussed the procedure including the risks, benefits and alternatives for the proposed anesthesia with the patient or authorized representative who has indicated his/her understanding and acceptance.   Dental advisory given  Plan Discussed with: CRNA and Anesthesiologist  Anesthesia Plan Comments:         Anesthesia Quick Evaluation

## 2014-10-13 NOTE — H&P (Signed)
  Procedure: Surveillance colonoscopy. Sigmoid colon cancer surgery performed in 2010.  History: The patient is a 59 year old male born 1956-01-01. On 01/01/2008 his colonoscopy showed sigmoid colon adenocarcinoma. He underwent sigmoid colon cancer surgery in early 2010. He is scheduled to undergo surveillance colonoscopy today.  Medication allergies: None  Past medical history: Obstructive sleep apnea syndrome. Morbid obesity. Type 2 diabetes mellitus. Allergic rhinitis. Sigmoid colon cancer surgery.  Exam: The patient is alert and lying comfortably on the endoscopy stretcher. Abdomen is soft and nontender to palpation. Lungs are clear to auscultation. Cardiac exam reveals a regular rhythm.  Plan: Proceed with surveillance colonoscopy

## 2014-10-15 ENCOUNTER — Encounter (HOSPITAL_COMMUNITY): Payer: Self-pay | Admitting: Gastroenterology

## 2014-10-19 ENCOUNTER — Encounter
Payer: Commercial Managed Care - HMO | Attending: Physical Medicine & Rehabilitation | Admitting: Physical Medicine & Rehabilitation

## 2014-10-19 ENCOUNTER — Encounter: Payer: Self-pay | Admitting: Physical Medicine & Rehabilitation

## 2014-10-19 DIAGNOSIS — F32A Depression, unspecified: Secondary | ICD-10-CM

## 2014-10-19 DIAGNOSIS — M47816 Spondylosis without myelopathy or radiculopathy, lumbar region: Secondary | ICD-10-CM | POA: Diagnosis not present

## 2014-10-19 DIAGNOSIS — M1712 Unilateral primary osteoarthritis, left knee: Secondary | ICD-10-CM

## 2014-10-19 DIAGNOSIS — F329 Major depressive disorder, single episode, unspecified: Secondary | ICD-10-CM

## 2014-10-19 DIAGNOSIS — M1711 Unilateral primary osteoarthritis, right knee: Secondary | ICD-10-CM

## 2014-10-19 MED ORDER — OXYCODONE-ACETAMINOPHEN 7.5-325 MG PO TABS: 1.0000 | ORAL_TABLET | Freq: Four times a day (QID) | ORAL | Status: DC | PRN

## 2014-10-19 MED ORDER — MORPHINE SULFATE ER 30 MG PO TBCR: 30.0000 mg | EXTENDED_RELEASE_TABLET | Freq: Three times a day (TID) | ORAL | Status: DC

## 2014-10-19 MED ORDER — CITALOPRAM HYDROBROMIDE 40 MG PO TABS: 40.0000 mg | ORAL_TABLET | Freq: Every day | ORAL | Status: DC

## 2014-10-19 NOTE — Patient Instructions (Signed)
STOP THE GABAPENTIN FOR NOW.

## 2014-10-19 NOTE — Progress Notes (Signed)
Subjective:    Patient ID: Brian Richmond, male    DOB: November 13, 1955, 59 y.o.   MRN: [id number]  HPI   Telly is here in follow up of his chronic pain. He feels that his pain has been worse over the last several months. He really can't attribute it to anything specifically. He feels that the more he does, the more he tends to hurt.   His depression seems to be a little worse, but he's not sure that's not the case because of the pain.   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 They typically have two kids at a time.  He was started on thyroid supplementation recently and has noticed his bowels are more regular.  He is also on testosterone replacement  Pain Inventory Average Pain 8 Pain Right Now 7 My pain is dull and aching  In the last 24 hours, has pain interfered with the following? General activity 10 Relation with others 10 Enjoyment of life 10 What TIME of day is your pain at its worst? morning Sleep (in general) Poor  Pain is worse with: walking and standing Pain improves with: rest and medication Relief from Meds: 5  Mobility use a walker how many minutes can you walk? 0 ability to climb steps?  yes do you drive?  yes  Function disabled: date disabled . I need assistance with the following:  bathing  Neuro/Psych bladder control problems numbness tingling  Prior Studies Any changes since last visit?  no  Physicians involved in your care Any changes since last visit?  no   Family History  Problem Relation Age of Onset  . Ovarian cancer Mother   . Cancer Mother   . Heart disease Mother   . Arthritis Father    Social History   Social History  . Marital Status: Married    Spouse Name: N/A  . Number of Children: y  . Years of Education: N/A   Occupational History  . DISABLED    Social History Main Topics  . Smoking status: Never Smoker   . Smokeless tobacco: None  . Alcohol Use: No  . Drug Use: No  . Sexual Activity: Yes    Other Topics Concern  . None   Social History Narrative   Pt is married and lives with wife.     Past Surgical History  Procedure Laterality Date  . Colon surgery  03/2008  . Colonoscopy with propofol N/A 07/08/2013    Procedure: COLONOSCOPY WITH PROPOFOL;  Surgeon: [name], MD;  Location: [facility];  Service: Endoscopy;  Laterality: N/A;  . Foot surgery      ingrown toenails   . Colonoscopy with propofol N/A 10/13/2014    Procedure: COLONOSCOPY WITH PROPOFOL;  Surgeon: [name], MD;  Location: [facility];  Service: Endoscopy;  Laterality: N/A;   Past Medical History  Diagnosis Date  . Neuropathy ([place])   . Obesity     BMI 68.0 kg  . Diabetes mellitus without complication ([place])   . Hx of seasonal allergies   . Reduced mobility     06-27-13 states can transfer self.limited on distances-uses walker and wheelchair mostly.  . OSA (obstructive sleep apnea)     uses cpap nightly  . Colon cancer ([place]) 03/2008    surgery only  . Carpal tunnel syndrome     bilateral, and neuropathy bilateral feet  . Hypertension    There were no vitals taken for this visit.  Opioid Risk Score:   Fall Risk Score:  `1  Depression screen PHQ 2/9  Depression screen [facility] 2/9 10/19/2014 04/06/2014  Decreased Interest 2 2  Down, Depressed, Hopeless 2 2  PHQ - 2 Score 4 4  Altered sleeping - 3  Tired, decreased energy - 2  Change in appetite - 0  Feeling bad or failure about yourself  - 1  Trouble concentrating - 1  Moving slowly or fidgety/restless - 1  Suicidal thoughts - 0  PHQ-9 Score - 12     Review of Systems  Genitourinary:       Bladder control problems  Neurological: Positive for numbness.       Tingling  All other systems reviewed and are negative.      Objective:   Physical Exam  Constitutional: He is oriented to person, place, and time. He appears well-developed and well-nourished. Has long hair and beard  Morbidly obese ---weight stable  HENT:  Head:  Normocephalic and atraumatic.  Mouth/Throat: Oropharynx is clear and moist.  Eyes: Conjunctivae and EOM are normal. Pupils are equal, round, and reactive to light.  Neck: Normal range of motion.  Cardiovascular: Normal rate and regular rhythm.  Pulmonary/Chest: Effort normal and breath sounds normal. No respiratory distress. He has no wheezes.  Abdominal: Soft. He exhibits no distension.  Musculoskeletal:  Knees with effusion and crepitus with ROM. Hips are tender.  Lumbar back: He exhibits ongoing decreased range of motion and tenderness. Exam limited by size/wheelcair Neurological: He is alert and oriented to person, place, and time. Decreased PP and LT in feet. Psychiatric: His behavior is normal. Judgment and thought content normal.  Affect is flat  Assessment & Plan:   ASSESSMENT:  1. Chronic low back pain.  2. Osteoarthritis of the knees bilaterally.  3. Morbid obesity.  4. Depression.  5. Type 2 DM with peripheral neuropathy  6. Right shoulder pain. ?mild right AC arthritis    PLAN:  1. I rx'ed 2 prescriptions each for this month and next month, one for  Percocet 7.5/325 one p.o. q.8 hours p.r.n., 90 with no refill. and MS Contin CR 90 q8 hours, 90 with no refills. Pt has been stable and consistent with his medications. Second rx'es were given for next month. 2. voltaren gel, ice. Relative rest right shoulder. Information was provided on [facility] jt arthritis as well  3. Increase celexa to 40mg  qhs. I am hopeful that adjustments to his thyroid and testosterone might help his mood and pain as well.  4. miralax prn for bowels.     Fifteen minutes of face to face patient care time were spent during this visit. All questions were encouraged and answered.  Follow up in 2 months with NP

## 2014-11-10 DIAGNOSIS — N401 Enlarged prostate with lower urinary tract symptoms: Secondary | ICD-10-CM | POA: Diagnosis not present

## 2014-11-10 DIAGNOSIS — T387X5D Adverse effect of androgens and anabolic congeners, subsequent encounter: Secondary | ICD-10-CM | POA: Diagnosis not present

## 2014-11-10 DIAGNOSIS — N138 Other obstructive and reflux uropathy: Secondary | ICD-10-CM | POA: Diagnosis not present

## 2014-11-10 DIAGNOSIS — R358 Other polyuria: Secondary | ICD-10-CM | POA: Diagnosis not present

## 2014-11-10 DIAGNOSIS — E291 Testicular hypofunction: Secondary | ICD-10-CM | POA: Diagnosis not present

## 2014-12-07 ENCOUNTER — Ambulatory Visit: Payer: Commercial Managed Care - HMO | Admitting: Pulmonary Disease

## 2014-12-21 ENCOUNTER — Encounter
Payer: Commercial Managed Care - HMO | Attending: Physical Medicine & Rehabilitation | Admitting: Physical Medicine & Rehabilitation

## 2014-12-21 ENCOUNTER — Encounter: Payer: Self-pay | Admitting: Physical Medicine & Rehabilitation

## 2014-12-21 VITALS — BP 138/76 | HR 65

## 2014-12-21 DIAGNOSIS — M1712 Unilateral primary osteoarthritis, left knee: Secondary | ICD-10-CM | POA: Insufficient documentation

## 2014-12-21 DIAGNOSIS — M47816 Spondylosis without myelopathy or radiculopathy, lumbar region: Secondary | ICD-10-CM | POA: Diagnosis not present

## 2014-12-21 DIAGNOSIS — F329 Major depressive disorder, single episode, unspecified: Secondary | ICD-10-CM

## 2014-12-21 DIAGNOSIS — F32A Depression, unspecified: Secondary | ICD-10-CM

## 2014-12-21 DIAGNOSIS — M1711 Unilateral primary osteoarthritis, right knee: Secondary | ICD-10-CM | POA: Diagnosis not present

## 2014-12-21 MED ORDER — OXYCODONE-ACETAMINOPHEN 7.5-325 MG PO TABS: 1.0000 | ORAL_TABLET | Freq: Four times a day (QID) | ORAL | Status: DC | PRN

## 2014-12-21 MED ORDER — MORPHINE SULFATE ER 30 MG PO TBCR: 30.0000 mg | EXTENDED_RELEASE_TABLET | Freq: Three times a day (TID) | ORAL | Status: DC

## 2014-12-21 MED ORDER — DULOXETINE HCL 30 MG PO CPEP: 30.0000 mg | ORAL_CAPSULE | Freq: Every day | ORAL | Status: DC

## 2014-12-21 NOTE — Patient Instructions (Signed)
PLEASE CALL ME WITH ANY PROBLEMS OR QUESTIONS [id number]). HAVE A HAPPY HOLIDAY SEASON!!!   TRANSITION TO CYMBALTA WEEK 1: DECREASE CELEXA TO 20MG  WEEK 2: TAKE CYMBALTA 30MG  AND CELEXA 20MG  DAILY WEEK THREE AND ONWARD: [place]

## 2014-12-21 NOTE — Progress Notes (Signed)
Subjective:    Patient ID: Brian Richmond, male    DOB: 16-Feb-1955, 59 y.o.   MRN: [id number]  HPI   Brian Richmond is here in follow up of his chronic pain. i last saw him in October. He states things have remained fairly stable from a pain standpoint. He hasn't seen much improvement with the celexa, but he hasn't had any issues with tolerance.   His ms contin and percocet remain somewhat effective for his pain control. He still is w/c bound, only ambulating for extremely short distances around his home.     Pain Inventory Average Pain 7 Pain Right Now 6 My pain is constant, burning, dull and aching  In the last 24 hours, has pain interfered with the following? General activity 10 Relation with others 10 Enjoyment of life 10 What TIME of day is your pain at its worst? Morning and Night Sleep (in general) NA  Pain is worse with: walking and standing Pain improves with: rest and medication Relief from Meds: 7  Mobility walk with assistance use a walker how many minutes can you walk? 0 ability to climb steps?  yes do you drive?  yes use a wheelchair  Function disabled: date disabled NA I need assistance with the following:  bathing  Neuro/Psych bladder control problems numbness tingling depression  Prior Studies Any changes since last visit?  no  Physicians involved in your care Any changes since last visit?  no   Family History  Problem Relation Age of Onset  . Ovarian cancer Mother   . Cancer Mother   . Heart disease Mother   . Arthritis Father    Social History   Social History  . Marital Status: Married    Spouse Name: N/A  . Number of Children: y  . Years of Education: N/A   Occupational History  . DISABLED    Social History Main Topics  . Smoking status: Never Smoker   . Smokeless tobacco: None  . Alcohol Use: No  . Drug Use: No  . Sexual Activity: Yes   Other Topics Concern  . None   Social History Narrative   Pt is married and lives with  wife.     Past Surgical History  Procedure Laterality Date  . Colon surgery  03/2008  . Colonoscopy with propofol N/A 07/08/2013    Procedure: COLONOSCOPY WITH PROPOFOL;  Surgeon: [name], MD;  [facility];  Service: Endoscopy;  Laterality: N/A;  . Foot surgery      ingrown toenails   . Colonoscopy with propofol N/A 10/13/2014    Procedure: COLONOSCOPY WITH PROPOFOL;  Surgeon: [name], MD;  [facility];  Service: Endoscopy;  Laterality: N/A;   Past Medical History  Diagnosis Date  . Neuropathy ([place])   . Obesity     BMI 68.0 kg  . Diabetes mellitus without complication ([place])   . Hx of seasonal allergies   . Reduced mobility     06-27-13 states can transfer self.limited on distances-uses walker and wheelchair mostly.  . OSA (obstructive sleep apnea)     uses cpap nightly  . Colon cancer ([place]) 03/2008    surgery only  . Carpal tunnel syndrome     bilateral, and neuropathy bilateral feet  . Hypertension    BP 138/76 mmHg  Pulse 65  SpO2 94%  Opioid Risk Score:   Fall Risk Score:  `1  Depression screen PHQ 2/9  Depression screen [facility] 2/9 10/19/2014 04/06/2014  Decreased Interest 2 2  Down, Depressed, Hopeless 2 2  PHQ - 2 Score 4 4  Altered sleeping - 3  Tired, decreased energy - 2  Change in appetite - 0  Feeling bad or failure about yourself  - 1  Trouble concentrating - 1  Moving slowly or fidgety/restless - 1  Suicidal thoughts - 0  PHQ-9 Score - 12     Review of Systems  Respiratory: Positive for apnea.   Gastrointestinal: Positive for constipation.  Genitourinary:       Bladder Control Problems Urine Retention  Neurological: Positive for numbness.       Tingling  Psychiatric/Behavioral:       Depression  All other systems reviewed and are negative.      Objective:   Physical Exam  Constitutional: He is oriented to person, place, and time. He appears well-developed and well-nourished. Has long hair and beard    Morbidly obese ---weight stable  HENT:  Head: Normocephalic and atraumatic.  Mouth/Throat: Oropharynx is clear and moist.  Eyes: Conjunctivae and EOM are normal. Pupils are equal, round, and reactive to light.  Neck: Normal range of motion.  Cardiovascular: Normal rate and regular rhythm.  Pulmonary/Chest: Effort normal and breath sounds normal. No respiratory distress. He has no wheezes.  Abdominal: Soft. He exhibits no distension.  Musculoskeletal:  Knees with effusion and crepitus with ROM. Hips are tender.  Lumbar back: He exhibits ongoing decreased range of motion and tenderness. Exam limited by size/wheelcair Neurological: He is alert and oriented to person, place, and time. Decreased PP and LT in feet. Psychiatric: His behavior is normal. Judgment and thought content normal.  Affect is flat  Assessment & Plan:   ASSESSMENT:  1. Chronic low back pain.  2. Osteoarthritis of the knees bilaterally.  3. Morbid obesity.  4. Depression.  5. Type 2 DM with peripheral neuropathy  6. Right shoulder pain. ?mild right AC arthritis    PLAN:  1. I rx'ed 2 prescriptions: Percocet 7.5/325 one p.o. q.8 hours p.r.n., 90 with no refill. and MS Contin CR 90 q8 hours, 90 with no refills. Pt continues to be consistent with his medications. Second rx'es were given for next month.  2. voltaren gel, ice. Relative rest right shoulder. Information was provided on [facility] jt arthritis as well  3. Will wean celexa and begin trial of cymbalta---starting at 30mg  daily.  Testosterone and thyroid per primary.  4. miralax prn for bowels.   Fifteen minutes of face to face patient care time were spent during this visit. All questions were encouraged and answered.  Follow up in 2 months with NP

## 2014-12-31 DIAGNOSIS — I10 Essential (primary) hypertension: Secondary | ICD-10-CM | POA: Diagnosis not present

## 2014-12-31 DIAGNOSIS — E039 Hypothyroidism, unspecified: Secondary | ICD-10-CM | POA: Diagnosis not present

## 2014-12-31 DIAGNOSIS — D649 Anemia, unspecified: Secondary | ICD-10-CM | POA: Diagnosis not present

## 2014-12-31 DIAGNOSIS — E1140 Type 2 diabetes mellitus with diabetic neuropathy, unspecified: Secondary | ICD-10-CM | POA: Diagnosis not present

## 2014-12-31 DIAGNOSIS — Z23 Encounter for immunization: Secondary | ICD-10-CM | POA: Diagnosis not present

## 2015-01-04 DIAGNOSIS — N359 Urethral stricture, unspecified: Secondary | ICD-10-CM | POA: Diagnosis not present

## 2015-02-17 ENCOUNTER — Encounter: Payer: Medicare HMO | Attending: Physical Medicine & Rehabilitation | Admitting: Registered Nurse

## 2015-02-17 ENCOUNTER — Encounter: Payer: Self-pay | Admitting: Registered Nurse

## 2015-02-17 VITALS — BP 126/64 | HR 75

## 2015-02-17 DIAGNOSIS — Z5181 Encounter for therapeutic drug level monitoring: Secondary | ICD-10-CM

## 2015-02-17 DIAGNOSIS — G894 Chronic pain syndrome: Secondary | ICD-10-CM | POA: Diagnosis not present

## 2015-02-17 DIAGNOSIS — Z79899 Other long term (current) drug therapy: Secondary | ICD-10-CM

## 2015-02-17 DIAGNOSIS — M1712 Unilateral primary osteoarthritis, left knee: Secondary | ICD-10-CM | POA: Diagnosis not present

## 2015-02-17 DIAGNOSIS — M47816 Spondylosis without myelopathy or radiculopathy, lumbar region: Secondary | ICD-10-CM | POA: Diagnosis not present

## 2015-02-17 DIAGNOSIS — M1711 Unilateral primary osteoarthritis, right knee: Secondary | ICD-10-CM

## 2015-02-17 MED ORDER — OXYCODONE-ACETAMINOPHEN 7.5-325 MG PO TABS: 1.0000 | ORAL_TABLET | Freq: Four times a day (QID) | ORAL | Status: DC | PRN

## 2015-02-17 MED ORDER — MORPHINE SULFATE ER 30 MG PO TBCR: 30.0000 mg | EXTENDED_RELEASE_TABLET | Freq: Three times a day (TID) | ORAL | Status: DC

## 2015-02-17 NOTE — Progress Notes (Signed)
Subjective:    Patient ID: Brian Richmond, male    DOB: 1956-01-04, 60 y.o.   MRN: [id number]  HPI: [name] is a 60 year old male who returns for follow up for chronic pain and medication refill. He says his pain is located in his bilateral shoulders, lower back,  bilateral hips, bilateral knees left greater than right and lower extremities. He rates his pain 6. His current exercise regime is walking in his home with walker and occasionally performing stretching exercises. He arrived in motorized wheelchair.  Pain Inventory Average Pain 6 Pain Right Now 6 My pain is constant, burning, dull and aching  In the last 24 hours, has pain interfered with the following? General activity 9 Relation with others 9 Enjoyment of life 9 What TIME of day is your pain at its worst? evening Sleep (in general) Poor  Pain is worse with: some activites Pain improves with: medication Relief from Meds: not answered  Mobility use a walker ability to climb steps?  yes do you drive?  yes use a wheelchair  Function disabled: date disabled . I need assistance with the following:  bathing  Neuro/Psych bladder control problems numbness tingling depression  Prior Studies Any changes since last visit?  no  Physicians involved in your care Any changes since last visit?  no   Family History  Problem Relation Age of Onset  . Ovarian cancer Mother   . Cancer Mother   . Heart disease Mother   . Arthritis Father    Social History   Social History  . Marital Status: Married    Spouse Name: N/A  . Number of Children: y  . Years of Education: N/A   Occupational History  . DISABLED    Social History Main Topics  . Smoking status: Never Smoker   . Smokeless tobacco: None  . Alcohol Use: No  . Drug Use: No  . Sexual Activity: Yes   Other Topics Concern  . None   Social History Narrative   Pt is married and lives with wife.     Past Surgical History  Procedure Laterality  Date  . Colon surgery  03/2008  . Colonoscopy with propofol N/A 07/08/2013    Procedure: COLONOSCOPY WITH PROPOFOL;  Surgeon: [name], MD;  [facility];  Service: Endoscopy;  Laterality: N/A;  . Foot surgery      ingrown toenails   . Colonoscopy with propofol N/A 10/13/2014    Procedure: COLONOSCOPY WITH PROPOFOL;  Surgeon: [name], MD;  [facility];  Service: Endoscopy;  Laterality: N/A;   Past Medical History  Diagnosis Date  . Neuropathy ([place])   . Obesity     BMI 68.0 kg  . Diabetes mellitus without complication ([place])   . Hx of seasonal allergies   . Reduced mobility     06-27-13 states can transfer self.limited on distances-uses walker and wheelchair mostly.  . OSA (obstructive sleep apnea)     uses cpap nightly  . Colon cancer ([place]) 03/2008    surgery only  . Carpal tunnel syndrome     bilateral, and neuropathy bilateral feet  . Hypertension    BP 126/64 mmHg  Pulse 75  SpO2 93%  Opioid Risk Score:   Fall Risk Score:  `1  Depression screen PHQ 2/9  Depression screen [facility] 2/9 02/17/2015 10/19/2014 04/06/2014  Decreased Interest 2 2 2   Down, Depressed, Hopeless 2 2 2   PHQ - 2 Score 4  4 4  Altered sleeping - - 3  Tired, decreased energy - - 2  Change in appetite - - 0  Feeling bad or failure about yourself  - - 1  Trouble concentrating - - 1  Moving slowly or fidgety/restless - - 1  Suicidal thoughts - - 0  PHQ-9 Score - - 12    Review of Systems  Respiratory: Positive for apnea.   Gastrointestinal: Positive for constipation.  All other systems reviewed and are negative.      Objective:   Physical Exam  Constitutional: He is oriented to person, place, and time. He appears well-developed and well-nourished.  HENT:  Head: Normocephalic and atraumatic.  Neck: Normal range of motion. Neck supple.  Cardiovascular: Normal rate and regular rhythm.   Pulmonary/Chest: Effort normal and breath sounds normal.  Musculoskeletal:    Normal Muscle Bulk and Muscle Testing Reveals: Upper Extremities: Decreased ROM 90 Degrees and Muscle Strength 5/5 Right AC Joint Tenderness/ Right Rhomboid Tenderness Lower Extremities: Right: Full ROM and Muscle Strength 5/5 Right Lower Extremity Flexion Produces Pain into Patella Left: Decreased ROM and Muscle Strength 5/5 Left Lower Extremity Flexion Produces Pain into Patella And Lower Extremity Arrived in Motorized wheelchair  Neurological: He is alert and oriented to person, place, and time.  Skin: Skin is warm and dry.  Psychiatric: He has a normal mood and affect.  Nursing note and vitals reviewed.         Assessment & Plan:  1. Chronic low back pain. Refilled: oxycodone 7.5/325mg  one tablet every 6 hours as needed #90 and MS CONTIN 30 mg one tablet three times daily. #90. Second script was given for the following month.  2. Osteoarthritis of the knees bilaterally. Continue current medication regime. Voltaren Gel sample given 3. Morbid obesity. Encourage exercise and start eating healthy Living Diet regime.  4. Depression.Continue Celexa. Continue to Monitor  5. Type 2 DM with peripheral neuropathy : Continue to Monitor  15 minutes of face to face patient care time was spent during this visit. All questions were encouraged and answered

## 2015-02-20 LAB — TOXASSURE SELECT,+ANTIDEPR,UR: PDF: 0

## 2015-02-20 LAB — 6-ACETYLMORPHINE,TOXASSURE ADD
6-ACETYLMORPHINE: NEGATIVE
6-acetylmorphine: NOT DETECTED ng/mg creat

## 2015-02-22 NOTE — Progress Notes (Signed)
UDS is consistent for prescribed medications.  

## 2015-02-25 DIAGNOSIS — E291 Testicular hypofunction: Secondary | ICD-10-CM | POA: Diagnosis not present

## 2015-02-25 DIAGNOSIS — N138 Other obstructive and reflux uropathy: Secondary | ICD-10-CM | POA: Diagnosis not present

## 2015-02-25 DIAGNOSIS — T387X5D Adverse effect of androgens and anabolic congeners, subsequent encounter: Secondary | ICD-10-CM | POA: Diagnosis not present

## 2015-02-25 DIAGNOSIS — N401 Enlarged prostate with lower urinary tract symptoms: Secondary | ICD-10-CM | POA: Diagnosis not present

## 2015-02-25 DIAGNOSIS — N359 Urethral stricture, unspecified: Secondary | ICD-10-CM | POA: Diagnosis not present

## 2015-02-25 DIAGNOSIS — R358 Other polyuria: Secondary | ICD-10-CM | POA: Diagnosis not present

## 2015-03-30 ENCOUNTER — Other Ambulatory Visit: Payer: Self-pay | Admitting: Physical Medicine & Rehabilitation

## 2015-04-19 ENCOUNTER — Encounter: Payer: Commercial Managed Care - HMO | Attending: Physical Medicine & Rehabilitation | Admitting: Registered Nurse

## 2015-04-19 ENCOUNTER — Encounter: Payer: Self-pay | Admitting: Registered Nurse

## 2015-04-19 VITALS — BP 139/68 | HR 70 | Resp 14

## 2015-04-19 DIAGNOSIS — Z5181 Encounter for therapeutic drug level monitoring: Secondary | ICD-10-CM

## 2015-04-19 DIAGNOSIS — E119 Type 2 diabetes mellitus without complications: Secondary | ICD-10-CM | POA: Diagnosis not present

## 2015-04-19 DIAGNOSIS — M1711 Unilateral primary osteoarthritis, right knee: Secondary | ICD-10-CM

## 2015-04-19 DIAGNOSIS — M47816 Spondylosis without myelopathy or radiculopathy, lumbar region: Secondary | ICD-10-CM | POA: Diagnosis not present

## 2015-04-19 DIAGNOSIS — F329 Major depressive disorder, single episode, unspecified: Secondary | ICD-10-CM

## 2015-04-19 DIAGNOSIS — Z79899 Other long term (current) drug therapy: Secondary | ICD-10-CM

## 2015-04-19 DIAGNOSIS — F32A Depression, unspecified: Secondary | ICD-10-CM

## 2015-04-19 DIAGNOSIS — M1712 Unilateral primary osteoarthritis, left knee: Secondary | ICD-10-CM | POA: Diagnosis not present

## 2015-04-19 DIAGNOSIS — G894 Chronic pain syndrome: Secondary | ICD-10-CM

## 2015-04-19 MED ORDER — OXYCODONE-ACETAMINOPHEN 7.5-325 MG PO TABS: 1.0000 | ORAL_TABLET | Freq: Four times a day (QID) | ORAL | Status: DC | PRN

## 2015-04-19 MED ORDER — MORPHINE SULFATE ER 30 MG PO TBCR: 30.0000 mg | EXTENDED_RELEASE_TABLET | Freq: Three times a day (TID) | ORAL | Status: DC

## 2015-04-19 NOTE — Progress Notes (Signed)
Subjective:    Patient ID: Brian Richmond, male    DOB: 01-Nov-1955, 60 y.o.   MRN: [id number]  HPI: [name] is a 60 year old male who returns for follow up for chronic pain and medication refill. He states his pain is located in his bilateral shoulders, lower back, bilateral knees and bilateral lower extremities. He rates his pain 7. His current exercise regime is walking in his home with walker and occasionally performing stretching exercises. He arrived in motorized wheelchair.  Pain Inventory Average Pain 7 Pain Right Now 7 My pain is constant, dull, stabbing, tingling and aching  In the last 24 hours, has pain interfered with the following? General activity 9 Relation with others 9 Enjoyment of life 9 What TIME of day is your pain at its worst? morning Sleep (in general) Fair  Pain is worse with: walking and standing Pain improves with: rest and medication Relief from Meds: 5  Mobility walk with assistance use a walker how many minutes can you walk? 1 ability to climb steps?  yes use a wheelchair  Function disabled: date disabled . I need assistance with the following:  bathing  Neuro/Psych bladder control problems numbness tingling  Prior Studies Any changes since last visit?  no  Physicians involved in your care Any changes since last visit?  no   Family History  Problem Relation Age of Onset  . Ovarian cancer Mother   . Cancer Mother   . Heart disease Mother   . Arthritis Father    Social History   Social History  . Marital Status: Married    Spouse Name: N/A  . Number of Children: y  . Years of Education: N/A   Occupational History  . DISABLED    Social History Main Topics  . Smoking status: Never Smoker   . Smokeless tobacco: None  . Alcohol Use: No  . Drug Use: No  . Sexual Activity: Yes   Other Topics Concern  . None   Social History Narrative   Pt is married and lives with wife.     Past Surgical History  Procedure  Laterality Date  . Colon surgery  03/2008  . Colonoscopy with propofol N/A 07/08/2013    Procedure: COLONOSCOPY WITH PROPOFOL;  Surgeon: [name], MD;  Location: [facility];  Service: Endoscopy;  Laterality: N/A;  . Foot surgery      ingrown toenails   . Colonoscopy with propofol N/A 10/13/2014    Procedure: COLONOSCOPY WITH PROPOFOL;  Surgeon: [name], MD;  [facility];  Service: Endoscopy;  Laterality: N/A;   Past Medical History  Diagnosis Date  . Neuropathy ([place])   . Obesity     BMI 68.0 kg  . Diabetes mellitus without complication ([place])   . Hx of seasonal allergies   . Reduced mobility     06-27-13 states can transfer self.limited on distances-uses walker and wheelchair mostly.  . OSA (obstructive sleep apnea)     uses cpap nightly  . Colon cancer ([place]) 03/2008    surgery only  . Carpal tunnel syndrome     bilateral, and neuropathy bilateral feet  . Hypertension    BP 139/68 mmHg  Pulse 70  Resp 14  SpO2 97%  Opioid Risk Score:   Fall Risk Score:  `1  Depression screen PHQ 2/9  Depression screen [facility] 2/9 02/17/2015 10/19/2014 04/06/2014  Decreased Interest 2 2 2   Down, Depressed, Hopeless 2 2 2   PHQ -  2 Score 4 4 4   Altered sleeping - - 3  Tired, decreased energy - - 2  Change in appetite - - 0  Feeling bad or failure about yourself  - - 1  Trouble concentrating - - 1  Moving slowly or fidgety/restless - - 1  Suicidal thoughts - - 0  PHQ-9 Score - - 12    Review of Systems  Respiratory: Positive for apnea.   Gastrointestinal: Positive for constipation.  Genitourinary: Positive for difficulty urinating.  Skin: Positive for rash.  All other systems reviewed and are negative.      Objective:   Physical Exam  Constitutional: He is oriented to person, place, and time. He appears well-developed and well-nourished.  HENT:  Head: Normocephalic and atraumatic.  Neck: Normal range of motion. Neck supple.  Cardiovascular: Normal rate and  regular rhythm.   Pulmonary/Chest: Effort normal and breath sounds normal.  Musculoskeletal:  Normal Muscle Bulk and Muscle Testing Reveals: Upper Extremities: Full ROM and Muscle Strength 5/5 Back without spinal tenderness Lower Extremities: Left:Full ROM and Muscle Strength 5/5 Right: Decreased ROM and Muscle Strength 5/5 Right Lower Extremity Flexion Produces Pain into Patella Left Lower Extremity Flexion Produces Pain into Patella Arrived in motorized wheelchair   Neurological: He is alert and oriented to person, place, and time.  Skin: Skin is warm and dry.  Psychiatric: He has a normal mood and affect.  Nursing note and vitals reviewed.         Assessment & Plan:  1. Chronic low back pain. Refilled: oxycodone 7.5/325mg  one tablet every 6 hours as needed #90 and MS CONTIN 30 mg one tablet three times daily. #90. Second script was given for the following month. Prescription was postdated not to be fillbed before 04/27/15 and 05/27/15. 2. Osteoarthritis of the knees bilaterally. Continue current medication regime. Voltaren Gel sample given 3. Morbid obesity. Encourage exercise and start eating healthy Living Diet regime.  4. Depression.Continue Celexa. Continue to Monitor  5. Type 2 DM with peripheral neuropathy : Continue to Monitor  20 minutes of face to face patient care time was spent during this visit. All questions were encouraged and answered

## 2015-04-22 DIAGNOSIS — E1140 Type 2 diabetes mellitus with diabetic neuropathy, unspecified: Secondary | ICD-10-CM | POA: Diagnosis not present

## 2015-04-22 DIAGNOSIS — Z7984 Long term (current) use of oral hypoglycemic drugs: Secondary | ICD-10-CM | POA: Diagnosis not present

## 2015-04-22 DIAGNOSIS — E039 Hypothyroidism, unspecified: Secondary | ICD-10-CM | POA: Diagnosis not present

## 2015-06-03 DIAGNOSIS — N401 Enlarged prostate with lower urinary tract symptoms: Secondary | ICD-10-CM | POA: Diagnosis not present

## 2015-06-03 DIAGNOSIS — N138 Other obstructive and reflux uropathy: Secondary | ICD-10-CM | POA: Diagnosis not present

## 2015-06-21 ENCOUNTER — Encounter: Payer: Self-pay | Admitting: Physical Medicine & Rehabilitation

## 2015-06-21 ENCOUNTER — Encounter
Payer: Commercial Managed Care - HMO | Attending: Physical Medicine & Rehabilitation | Admitting: Physical Medicine & Rehabilitation

## 2015-06-21 VITALS — BP 151/65 | HR 83 | Resp 18

## 2015-06-21 DIAGNOSIS — M47816 Spondylosis without myelopathy or radiculopathy, lumbar region: Secondary | ICD-10-CM | POA: Insufficient documentation

## 2015-06-21 DIAGNOSIS — M1712 Unilateral primary osteoarthritis, left knee: Secondary | ICD-10-CM | POA: Diagnosis not present

## 2015-06-21 DIAGNOSIS — M1711 Unilateral primary osteoarthritis, right knee: Secondary | ICD-10-CM

## 2015-06-21 MED ORDER — OXYCODONE-ACETAMINOPHEN 7.5-325 MG PO TABS: 1.0000 | ORAL_TABLET | Freq: Four times a day (QID) | ORAL | Status: DC | PRN

## 2015-06-21 MED ORDER — OXYCODONE-ACETAMINOPHEN 7.5-325 MG PO TABS
1.0000 | ORAL_TABLET | Freq: Four times a day (QID) | ORAL | Status: DC | PRN
Start: 2015-06-21 — End: 2015-06-21

## 2015-06-21 MED ORDER — MORPHINE SULFATE ER 30 MG PO TBCR: 30.0000 mg | EXTENDED_RELEASE_TABLET | Freq: Three times a day (TID) | ORAL | Status: DC

## 2015-06-21 NOTE — Patient Instructions (Signed)
STOP YOUR PRAVACHOL

## 2015-06-21 NOTE — Progress Notes (Signed)
Subjective:    Patient ID: Brian Richmond, male    DOB: 03-08-1955, 60 y.o.   MRN: [id number]  HPI   Brian Richmond is here in follow up of his chronic pain. He states that his pain is worse. He feels that he hurts everywhere. He has pain in his "muscles" now. He notes pain in his arms up through his shoulders and into his arms at times too. His legs bother him too in this way. He has numbness in all 4's but he states his pain is different from this.   He has been on pravachol for about 3 years. His symptoms have been taking place for the last 4-5 months worsening over the last month.      Pain Inventory Average Pain 8 Pain Right Now 8 My pain is constant, burning, dull and aching  In the last 24 hours, has pain interfered with the following? General activity 9 Relation with others 9 Enjoyment of life 9 What TIME of day is your pain at its worst? ALL Sleep (in general) Poor  Pain is worse with: walking and standing Pain improves with: rest, heat/ice and medication Relief from Meds: 3  Mobility Do you have any goals in this area?  no  Function disabled: date disabled NA I need assistance with the following:  bathing  Neuro/Psych bladder control problems numbness tingling depression  Prior Studies Any changes since last visit?  no  Physicians involved in your care Any changes since last visit?  no   Family History  Problem Relation Age of Onset  . Ovarian cancer Mother   . Cancer Mother   . Heart disease Mother   . Arthritis Father    Social History   Social History  . Marital Status: Married    Spouse Name: N/A  . Number of Children: y  . Years of Education: N/A   Occupational History  . DISABLED    Social History Main Topics  . Smoking status: Never Smoker   . Smokeless tobacco: None  . Alcohol Use: No  . Drug Use: No  . Sexual Activity: Yes   Other Topics Concern  . None   Social History Narrative   Pt is married and lives with wife.     Past  Surgical History  Procedure Laterality Date  . Colon surgery  03/2008  . Colonoscopy with propofol N/A 07/08/2013    Procedure: COLONOSCOPY WITH PROPOFOL;  Surgeon: [name], MD;  [facility];  Service: Endoscopy;  Laterality: N/A;  . Foot surgery      ingrown toenails   . Colonoscopy with propofol N/A 10/13/2014    Procedure: COLONOSCOPY WITH PROPOFOL;  Surgeon: [name], MD;  [facility];  Service: Endoscopy;  Laterality: N/A;   Past Medical History  Diagnosis Date  . Neuropathy ([place])   . Obesity     BMI 68.0 kg  . Diabetes mellitus without complication ([place])   . Hx of seasonal allergies   . Reduced mobility     06-27-13 states can transfer self.limited on distances-uses walker and wheelchair mostly.  . OSA (obstructive sleep apnea)     uses cpap nightly  . Colon cancer ([place]) 03/2008    surgery only  . Carpal tunnel syndrome     bilateral, and neuropathy bilateral feet  . Hypertension    BP 151/65 mmHg  Pulse 83  Resp 18  SpO2 96%  Opioid Risk Score:   Fall Risk Score:  `1  Depression screen PHQ 2/9  Depression screen [facility] 2/9 06/21/2015 02/17/2015 10/19/2014 04/06/2014  Decreased Interest 0 2 2 2   Down, Depressed, Hopeless 0 2 2 2   PHQ - 2 Score 0 4 4 4   Altered sleeping - - - 3  Tired, decreased energy - - - 2  Change in appetite - - - 0  Feeling bad or failure about yourself  - - - 1  Trouble concentrating - - - 1  Moving slowly or fidgety/restless - - - 1  Suicidal thoughts - - - 0  PHQ-9 Score - - - 12       Review of Systems  All other systems reviewed and are negative.      Objective:   Physical Exam Constitutional: He is oriented to person, place, and time. He appears well-developed and well-nourished. Has long hair and beard  Morbidly obese ---weight stable  HENT:  Head: Normocephalic and atraumatic.  Mouth/Throat: Oropharynx is clear and moist.  Eyes: Conjunctivae and EOM are normal. Pupils are equal, round, and  reactive to light.  Neck: Normal range of motion.  Cardiovascular: Normal rate and regular rhythm.  Pulmonary/Chest: Effort normal and breath sounds normal. No respiratory distress. He has no wheezes.  Abdominal: Soft. He exhibits no distension.  Musculoskeletal:  Knees with effusion and crepitus with ROM. Hips are tender.  Lumbar back: He exhibits ongoing decreased range of motion and tenderness. Exam limited by size/wheelcair Neurological: He is alert and oriented to person, place, and time. Decreased PP and LT in feet. Psychiatric: His behavior is normal. Judgment and thought content normal.  Affect is flat   Assessment & Plan:   ASSESSMENT:  1. Chronic low back pain.  2. Osteoarthritis of the knees bilaterally.  3. Morbid obesity.  4. Depression.  5. Type 2 DM with peripheral neuropathy  6. Diffuse myalgias--?cholesterol related vs central pain syndrome.    PLAN:  1. I rx'ed 2 prescriptions:  Percocet 7.5/325 one p.o. q.8 hours p.r.n., 90 with no refill. and MS Contin CR 90 q8 hours, 90 with no refills. Pt continues to be consistent with his medications. Second rx'es were given for next month.  2. voltaren gel, ice. Relative rest right shoulder. Information was provided on [name] j[facility] arthritis as well  3. Continue cymbalta---starting at 30mg  daily. Testosterone and thyroid per primary.  4. Will hold pravachol and observe. I suspect that his pain is statin related. If pain is persistent, will consider an anticonvulsant for generalized,chronic pain syndrome.  .  Fifteen minutes of face to face patient care time were spent during this visit. All questions were encouraged and answered.  Follow up in 2 months with NP

## 2015-06-22 DIAGNOSIS — E119 Type 2 diabetes mellitus without complications: Secondary | ICD-10-CM | POA: Diagnosis not present

## 2015-06-22 DIAGNOSIS — M129 Arthropathy, unspecified: Secondary | ICD-10-CM | POA: Diagnosis not present

## 2015-06-22 DIAGNOSIS — E669 Obesity, unspecified: Secondary | ICD-10-CM | POA: Diagnosis not present

## 2015-06-22 DIAGNOSIS — G5600 Carpal tunnel syndrome, unspecified upper limb: Secondary | ICD-10-CM | POA: Diagnosis not present

## 2015-06-28 ENCOUNTER — Other Ambulatory Visit: Payer: Self-pay | Admitting: Physical Medicine & Rehabilitation

## 2015-08-04 DIAGNOSIS — I10 Essential (primary) hypertension: Secondary | ICD-10-CM | POA: Diagnosis not present

## 2015-08-04 DIAGNOSIS — D649 Anemia, unspecified: Secondary | ICD-10-CM | POA: Diagnosis not present

## 2015-08-04 DIAGNOSIS — G8929 Other chronic pain: Secondary | ICD-10-CM | POA: Diagnosis not present

## 2015-08-04 DIAGNOSIS — Z79899 Other long term (current) drug therapy: Secondary | ICD-10-CM | POA: Diagnosis not present

## 2015-08-04 DIAGNOSIS — E039 Hypothyroidism, unspecified: Secondary | ICD-10-CM | POA: Diagnosis not present

## 2015-08-04 DIAGNOSIS — G4730 Sleep apnea, unspecified: Secondary | ICD-10-CM | POA: Diagnosis not present

## 2015-08-04 DIAGNOSIS — Z7984 Long term (current) use of oral hypoglycemic drugs: Secondary | ICD-10-CM | POA: Diagnosis not present

## 2015-08-04 DIAGNOSIS — E1140 Type 2 diabetes mellitus with diabetic neuropathy, unspecified: Secondary | ICD-10-CM | POA: Diagnosis not present

## 2015-08-04 DIAGNOSIS — E785 Hyperlipidemia, unspecified: Secondary | ICD-10-CM | POA: Diagnosis not present

## 2015-08-18 ENCOUNTER — Encounter: Payer: Self-pay | Admitting: Physical Medicine & Rehabilitation

## 2015-08-18 ENCOUNTER — Encounter
Payer: Commercial Managed Care - HMO | Attending: Physical Medicine & Rehabilitation | Admitting: Physical Medicine & Rehabilitation

## 2015-08-18 VITALS — BP 112/73 | HR 65

## 2015-08-18 DIAGNOSIS — M1711 Unilateral primary osteoarthritis, right knee: Secondary | ICD-10-CM

## 2015-08-18 DIAGNOSIS — M47816 Spondylosis without myelopathy or radiculopathy, lumbar region: Secondary | ICD-10-CM | POA: Diagnosis not present

## 2015-08-18 DIAGNOSIS — Z79899 Other long term (current) drug therapy: Secondary | ICD-10-CM

## 2015-08-18 DIAGNOSIS — G894 Chronic pain syndrome: Secondary | ICD-10-CM

## 2015-08-18 DIAGNOSIS — Z5181 Encounter for therapeutic drug level monitoring: Secondary | ICD-10-CM

## 2015-08-18 DIAGNOSIS — M47896 Other spondylosis, lumbar region: Secondary | ICD-10-CM | POA: Diagnosis not present

## 2015-08-18 DIAGNOSIS — M1712 Unilateral primary osteoarthritis, left knee: Secondary | ICD-10-CM

## 2015-08-18 MED ORDER — MORPHINE SULFATE ER 30 MG PO TBCR: 30.0000 mg | EXTENDED_RELEASE_TABLET | Freq: Three times a day (TID) | ORAL | 0 refills | Status: DC

## 2015-08-18 MED ORDER — OXYCODONE-ACETAMINOPHEN 7.5-325 MG PO TABS: 1.0000 | ORAL_TABLET | Freq: Four times a day (QID) | ORAL | 0 refills | Status: DC | PRN

## 2015-08-18 NOTE — Patient Instructions (Signed)
PLEASE CALL ME WITH ANY PROBLEMS OR QUESTIONS ([phone])  

## 2015-08-18 NOTE — Progress Notes (Signed)
Subjective:    Patient ID: EYOB WORM, male    DOB: 1955-12-28, 60 y.o.   MRN: [id number]  HPI   Brian Richmond is here in follow up of his chronic pain. He is feeling better after coming off pravachol. His family physician is going to place him on crestor.  He remains on his ms contin and percocet for pain control. He does well if he doesn't have to walk too far on his knees.     Pain Inventory Average Pain 6 Pain Right Now 6 My pain is constant, stabbing, tingling and aching  In the last 24 hours, has pain interfered with the following? General activity 9 Relation with others 9 Enjoyment of life 9 What TIME of day is your pain at its worst? night Sleep (in general) Fair  Pain is worse with: walking and standing Pain improves with: rest and medication Relief from Meds: 7  Mobility walk with assistance use a walker how many minutes can you walk? 0 ability to climb steps?  yes do you drive?  yes use a wheelchair  Function disabled: date disabled . I need assistance with the following:  bathing  Neuro/Psych bladder control problems numbness tingling depression  Prior Studies Any changes since last visit?  no  Physicians involved in your care Any changes since last visit?  no   Family History  Problem Relation Age of Onset  . Ovarian cancer Mother   . Cancer Mother   . Heart disease Mother   . Arthritis Father    Social History   Social History  . Marital status: Married    Spouse name: N/A  . Number of children: y  . Years of education: N/A   Occupational History  . DISABLED Disabled   Social History Main Topics  . Smoking status: Never Smoker  . Smokeless tobacco: Never Used  . Alcohol use No  . Drug use: No  . Sexual activity: Yes   Other Topics Concern  . None   Social History Narrative   Pt is married and lives with wife.     Past Surgical History:  Procedure Laterality Date  . COLON SURGERY  03/2008  . COLONOSCOPY WITH PROPOFOL N/A  07/08/2013   Procedure: COLONOSCOPY WITH PROPOFOL;  Surgeon: [name], MD;  [facility];  Service: Endoscopy;  Laterality: N/A;  . COLONOSCOPY WITH PROPOFOL N/A 10/13/2014   Procedure: COLONOSCOPY WITH PROPOFOL;  Surgeon: [name], MD;  [facility];  Service: Endoscopy;  Laterality: N/A;  . FOOT SURGERY     ingrown toenails    Past Medical History:  Diagnosis Date  . Carpal tunnel syndrome    bilateral, and neuropathy bilateral feet  . Colon cancer ([place]) 03/2008   surgery only  . Diabetes mellitus without complication ([place])   . Hx of seasonal allergies   . Hypertension   . Neuropathy ([place])   . Obesity    BMI 68.0 kg  . OSA (obstructive sleep apnea)    uses cpap nightly  . Reduced mobility    06-27-13 states can transfer self.limited on distances-uses walker and wheelchair mostly.   There were no vitals taken for this visit.  Opioid Risk Score:   Fall Risk Score:  `1  Depression screen PHQ 2/9  Depression screen [facility] 2/9 06/21/2015 02/17/2015 10/19/2014 04/06/2014  Decreased Interest 0 2 2 2   Down, Depressed, Hopeless 0 2 2 2   PHQ - 2 Score 0 4 4 4   Altered sleeping - - -  3  Tired, decreased energy - - - 2  Change in appetite - - - 0  Feeling bad or failure about yourself  - - - 1  Trouble concentrating - - - 1  Moving slowly or fidgety/restless - - - 1  Suicidal thoughts - - - 0  PHQ-9 Score - - - 12    Review of Systems  HENT: Negative.   Respiratory: Positive for apnea.   Gastrointestinal: Positive for constipation.  Genitourinary: Positive for difficulty urinating.  Musculoskeletal: Positive for back pain and gait problem.  Skin: Positive for rash.  Neurological: Positive for numbness.       Tingling   Hematological: Negative.   Psychiatric/Behavioral: Positive for dysphoric mood.       Objective:   Physical Exam  Constitutional: He is oriented to person, place, and time. He appears well-developed and well-nourished. Has  long hair and beard  Morbidly obese ---weight stable  HENT:  Head: Normocephalic and atraumatic.  Mouth/Throat: Oropharynx is clear and moist.  Eyes: Conjunctivae and EOM are normal. Pupils are equal, round, and reactive to light.  Neck: Normal range of motion.  Cardiovascular: Normal rate and regular rhythm.  Pulmonary/Chest: Effort normal and breath sounds normal. No respiratory distress. He has no wheezes.  Abdominal: Soft. He exhibits no distension.  Musculoskeletal:  Knees with effusion and crepitus with ROM. Hips are tender.  Lumbar back: He exhibits ongoing decreased range of motion and tenderness. Exam limited by size/wheelcair Neurological: He is alert and oriented to person, place, and time. Decreased PP and LT in feet. Psychiatric: His behavior is normal. Judgment and thought content normal.  Affect is bright. In good spirits.    Assessment & Plan:   ASSESSMENT:  1. Chronic low back pain.  2. Osteoarthritis of the knees bilaterally.  3. Morbid obesity.  4. Depression.  5. Type 2 DM with peripheral neuropathy  6. Diffuse myalgias--due to statin---resolved    PLAN:  1. I rx'ed 2 prescriptions:  Percocet 7.5/325 one p.o. q.8 hours p.r.n., 90 with no refill. and MS Contin CR 90 q8 hours, 90 with no refills. Pt continues to be consistent with his medications. Second rx'es were given for next month.  2. voltaren gel, ice to knees 3. Continue cymbalta---starting at 30mg  daily. Testosterone and thyroid per primary.  4. Statin per primary. Crestor can cause some of the same issues too, so he needs to be aware.   Fifteen minutes of face to face patient care time were spent during this visit. All questions were encouraged and answered.  Follow up in 2 months with NP       b

## 2015-08-26 LAB — 6-ACETYLMORPHINE,TOXASSURE ADD
6-ACETYLMORPHINE: NEGATIVE
6-acetylmorphine: NOT DETECTED ng/mg creat

## 2015-08-26 LAB — TOXASSURE SELECT,+ANTIDEPR,UR: PDF: 0

## 2015-08-27 NOTE — Progress Notes (Signed)
Urine drug screen for this encounter is consistent for prescribed medications.   

## 2015-09-02 DIAGNOSIS — N138 Other obstructive and reflux uropathy: Secondary | ICD-10-CM | POA: Diagnosis not present

## 2015-09-02 DIAGNOSIS — N401 Enlarged prostate with lower urinary tract symptoms: Secondary | ICD-10-CM | POA: Diagnosis not present

## 2015-09-06 DIAGNOSIS — E119 Type 2 diabetes mellitus without complications: Secondary | ICD-10-CM | POA: Diagnosis not present

## 2015-09-20 ENCOUNTER — Other Ambulatory Visit: Payer: Self-pay | Admitting: Physical Medicine & Rehabilitation

## 2015-09-27 ENCOUNTER — Other Ambulatory Visit: Payer: Self-pay | Admitting: Physical Medicine & Rehabilitation

## 2015-10-20 ENCOUNTER — Encounter: Payer: Commercial Managed Care - HMO | Attending: Physical Medicine & Rehabilitation | Admitting: Registered Nurse

## 2015-10-20 ENCOUNTER — Encounter: Payer: Self-pay | Admitting: Registered Nurse

## 2015-10-20 VITALS — BP 115/71 | HR 77

## 2015-10-20 DIAGNOSIS — M1711 Unilateral primary osteoarthritis, right knee: Secondary | ICD-10-CM

## 2015-10-20 DIAGNOSIS — M1712 Unilateral primary osteoarthritis, left knee: Secondary | ICD-10-CM | POA: Insufficient documentation

## 2015-10-20 DIAGNOSIS — F32A Depression, unspecified: Secondary | ICD-10-CM

## 2015-10-20 DIAGNOSIS — G894 Chronic pain syndrome: Secondary | ICD-10-CM

## 2015-10-20 DIAGNOSIS — M47816 Spondylosis without myelopathy or radiculopathy, lumbar region: Secondary | ICD-10-CM | POA: Diagnosis not present

## 2015-10-20 DIAGNOSIS — Z5181 Encounter for therapeutic drug level monitoring: Secondary | ICD-10-CM

## 2015-10-20 DIAGNOSIS — M545 Low back pain, unspecified: Secondary | ICD-10-CM

## 2015-10-20 DIAGNOSIS — Z79899 Other long term (current) drug therapy: Secondary | ICD-10-CM

## 2015-10-20 DIAGNOSIS — F329 Major depressive disorder, single episode, unspecified: Secondary | ICD-10-CM

## 2015-10-20 DIAGNOSIS — G8929 Other chronic pain: Secondary | ICD-10-CM

## 2015-10-20 MED ORDER — OXYCODONE-ACETAMINOPHEN 7.5-325 MG PO TABS: 1.0000 | ORAL_TABLET | Freq: Four times a day (QID) | ORAL | 0 refills | Status: DC | PRN

## 2015-10-20 MED ORDER — MORPHINE SULFATE ER 30 MG PO TBCR: 30.0000 mg | EXTENDED_RELEASE_TABLET | Freq: Three times a day (TID) | ORAL | 0 refills | Status: DC

## 2015-10-20 NOTE — Progress Notes (Signed)
Subjective:    Patient ID: Brian Richmond, male    DOB: 07/06/1955, 60 y.o.   MRN: [id number]  HPI:  [name] is a 60 year old male who returns for follow up for chronic pain and medication refill. He states his pain is located in his bilateral shoulders, lower back, bilateral knees and bilateral lower extremities. He rates his pain 7. His current exercise regime is walking in his home with walker. He admits he's not active and depressed, denies suicidal ideation. [facility] pamphlet given.   He arrived in motorized wheelchair.  Pain Inventory Average Pain 7 Pain Right Now 7 My pain is constant and burning  In the last 24 hours, has pain interfered with the following? General activity 9 Relation with others 9 Enjoyment of life 9 What TIME of day is your pain at its worst? evening Sleep (in general) Poor  Pain is worse with: walking and standing Pain improves with: rest and medication Relief from Meds: fair  Mobility use a walker how many minutes can you walk? 0 ability to climb steps?  no use a wheelchair  Function disabled: date disabled n/a I need assistance with the following:  bathing  Neuro/Psych bladder control problems numbness tingling depression  Prior Studies Any changes since last visit?  no  Physicians involved in your care Any changes since last visit?  no   Family History  Problem Relation Age of Onset  . Ovarian cancer Mother   . Cancer Mother   . Heart disease Mother   . Arthritis Father    Social History   Social History  . Marital status: Married    Spouse name: N/A  . Number of children: y  . Years of education: N/A   Occupational History  . DISABLED Disabled   Social History Main Topics  . Smoking status: Never Smoker  . Smokeless tobacco: Never Used  . Alcohol use No  . Drug use: No  . Sexual activity: Yes   Other Topics Concern  . Not on file   Social History Narrative   Pt is married and lives with  wife.     Past Surgical History:  Procedure Laterality Date  . COLON SURGERY  03/2008  . COLONOSCOPY WITH PROPOFOL N/A 07/08/2013   Procedure: COLONOSCOPY WITH PROPOFOL;  Surgeon: [name], MD;  [facility];  Service: Endoscopy;  Laterality: N/A;  . COLONOSCOPY WITH PROPOFOL N/A 10/13/2014   Procedure: COLONOSCOPY WITH PROPOFOL;  Surgeon: [name], MD;  [facility];  Service: Endoscopy;  Laterality: N/A;  . FOOT SURGERY     ingrown toenails    Past Medical History:  Diagnosis Date  . Carpal tunnel syndrome    bilateral, and neuropathy bilateral feet  . Colon cancer ([place]) 03/2008   surgery only  . Diabetes mellitus without complication ([place])   . Hx of seasonal allergies   . Hypertension   . Neuropathy ([place])   . Obesity    BMI 68.0 kg  . OSA (obstructive sleep apnea)    uses cpap nightly  . Reduced mobility    06-27-13 states can transfer self.limited on distances-uses walker and wheelchair mostly.   There were no vitals taken for this visit.  Opioid Risk Score:   Fall Risk Score:  `1  Depression screen PHQ 2/9  Depression screen [facility] 2/9 06/21/2015 02/17/2015 10/19/2014 04/06/2014  Decreased Interest 0 2 2 2   Down, Depressed, Hopeless 0 2 2 2   PHQ -  2 Score 0 4 4 4   Altered sleeping - - - 3  Tired, decreased energy - - - 2  Change in appetite - - - 0  Feeling bad or failure about yourself  - - - 1  Trouble concentrating - - - 1  Moving slowly or fidgety/restless - - - 1  Suicidal thoughts - - - 0  PHQ-9 Score - - - 12    Review of Systems  Genitourinary: Positive for dysuria.  Neurological: Positive for numbness.       Tingling  Psychiatric/Behavioral: Positive for dysphoric mood.  All other systems reviewed and are negative.      Objective:   Physical Exam  Constitutional: He is oriented to person, place, and time. He appears well-developed and well-nourished.  HENT:  Head: Normocephalic and atraumatic.  Neck: Normal range of  motion. Neck supple.  Cardiovascular: Normal rate.   Pulmonary/Chest: Effort normal and breath sounds normal.  Musculoskeletal:  Normal Muscle Bulk and Muscle Testing Reveals: Upper Extremities: Full ROM and Muscle Strength 5/5 Lumbar Paraspinal Tenderness: L-3- L-4 Lower Extremities: Decreased ROM and Muscle Strength 4/5 Bilateral Lower Extremities Flexion Produces Pain into Bilateral Patella's Arrived in motorized wheelchair  Neurological: He is alert and oriented to person, place, and time.  Skin: Skin is warm and dry.  Psychiatric: He has a normal mood and affect.  Nursing note and vitals reviewed.         Assessment & Plan:  1. Chronic low back pain. Refilled: oxycodone 7.5/325mg  one tablet every 6 hours as needed #90 and MS CONTIN 30 mg one tablet three times daily. #90. Second script was given for the following month.  We will continue the opioid monitoring program, this consists of regular clinic visits, examinations, urine drug screen, pill counts as well as use of [place] controlled substance reporting system. 2. Osteoarthritis of the knees bilaterally. Continue current medication regime. Voltaren Gel sample given 3. Morbid obesity. Encourage exercise and start eating healthy Living Diet regime.  4. Depression.Continue Celexa. [facility] Pamphlet given for Counseling. Continue to Monitor  5. Type 2 DM with peripheral neuropathy : Continue to Monitor  20 minutes of face to face patient care time was spent during this visit. All questions were encouraged and answered  F/U in 2 moinths

## 2015-12-06 DIAGNOSIS — G4730 Sleep apnea, unspecified: Secondary | ICD-10-CM | POA: Diagnosis not present

## 2015-12-06 DIAGNOSIS — G589 Mononeuropathy, unspecified: Secondary | ICD-10-CM | POA: Diagnosis not present

## 2015-12-06 DIAGNOSIS — E1140 Type 2 diabetes mellitus with diabetic neuropathy, unspecified: Secondary | ICD-10-CM | POA: Diagnosis not present

## 2015-12-06 DIAGNOSIS — G609 Hereditary and idiopathic neuropathy, unspecified: Secondary | ICD-10-CM | POA: Diagnosis not present

## 2015-12-06 DIAGNOSIS — M2550 Pain in unspecified joint: Secondary | ICD-10-CM | POA: Diagnosis not present

## 2015-12-23 ENCOUNTER — Encounter: Payer: Self-pay | Admitting: Registered Nurse

## 2015-12-23 ENCOUNTER — Encounter: Payer: Commercial Managed Care - HMO | Attending: Physical Medicine & Rehabilitation | Admitting: Registered Nurse

## 2015-12-23 VITALS — BP 170/92 | HR 81

## 2015-12-23 DIAGNOSIS — G8929 Other chronic pain: Secondary | ICD-10-CM

## 2015-12-23 DIAGNOSIS — Z5181 Encounter for therapeutic drug level monitoring: Secondary | ICD-10-CM | POA: Diagnosis not present

## 2015-12-23 DIAGNOSIS — M47816 Spondylosis without myelopathy or radiculopathy, lumbar region: Secondary | ICD-10-CM | POA: Diagnosis not present

## 2015-12-23 DIAGNOSIS — M545 Low back pain: Secondary | ICD-10-CM

## 2015-12-23 DIAGNOSIS — Z79899 Other long term (current) drug therapy: Secondary | ICD-10-CM | POA: Diagnosis not present

## 2015-12-23 DIAGNOSIS — M1711 Unilateral primary osteoarthritis, right knee: Secondary | ICD-10-CM

## 2015-12-23 DIAGNOSIS — M1712 Unilateral primary osteoarthritis, left knee: Secondary | ICD-10-CM | POA: Diagnosis not present

## 2015-12-23 DIAGNOSIS — G894 Chronic pain syndrome: Secondary | ICD-10-CM

## 2015-12-23 MED ORDER — MORPHINE SULFATE ER 30 MG PO TBCR: 30.0000 mg | EXTENDED_RELEASE_TABLET | Freq: Three times a day (TID) | ORAL | 0 refills | Status: DC

## 2015-12-23 MED ORDER — DULOXETINE HCL 30 MG PO CPEP: 30.0000 mg | ORAL_CAPSULE | Freq: Every day | ORAL | 0 refills | Status: DC

## 2015-12-23 MED ORDER — OXYCODONE-ACETAMINOPHEN 7.5-325 MG PO TABS: 1.0000 | ORAL_TABLET | Freq: Four times a day (QID) | ORAL | 0 refills | Status: DC | PRN

## 2015-12-23 NOTE — Progress Notes (Signed)
Subjective:    Patient ID: Brian Richmond, male    DOB: [birth year], 60 y.o.   MRN: [id number]  HPI: [name] is a 60 year old male who returns for follow up for chronic pain and medication refill. He states his pain is located in his bilateral shoulders, lower back, bilateral hips, bilateral knees and bilateral lower extremities. Also states he has generalized pain all over. He rates his pain 8. His current exercise regime is walking in his home with walker.   He arrived in motorized wheelchair. [name] forgot his medication bottles, according to [facility] his Morphine and Oxycodone was picked up on 12/08/2015, educated on Narcotic Policy and verbalizes understanding.   Pain Inventory Average Pain 8 Pain Right Now 8 My pain is constant, burning, dull and aching  In the last 24 hours, has pain interfered with the following? General activity 9 Relation with others 9 Enjoyment of life 9 What TIME of day is your pain at its worst? all Sleep (in general) Poor  Pain is worse with: walking and standing Pain improves with: rest, heat/ice and medication Relief from Meds: 5  Mobility use a walker ability to climb steps?  no do you drive?  yes use a wheelchair transfers alone  Function disabled: date disabled . I need assistance with the following:  bathing  Neuro/Psych bladder control problems numbness tingling depression  Prior Studies Any changes since last visit?  no  Physicians involved in your care Any changes since last visit?  no   Family History  Problem Relation Age of Onset  . Ovarian cancer Mother   . Cancer Mother   . Heart disease Mother   . Arthritis Father    Social History   Social History  . Marital status: Married    Spouse name: N/A  . Number of children: y  . Years of education: N/A   Occupational History  . DISABLED Disabled   Social History Main Topics  . Smoking status: Never Smoker  . Smokeless tobacco: Never Used  .  Alcohol use No  . Drug use: No  . Sexual activity: Yes   Other Topics Concern  . Not on file   Social History Narrative   Pt is married and lives with wife.     Past Surgical History:  Procedure Laterality Date  . COLON SURGERY  03/2008  . COLONOSCOPY WITH PROPOFOL N/A 07/08/2013   Procedure: COLONOSCOPY WITH PROPOFOL;  Surgeon: [name], MD;  Location: [facility];  Service: Endoscopy;  Laterality: N/A;  . COLONOSCOPY WITH PROPOFOL N/A 10/13/2014   Procedure: COLONOSCOPY WITH PROPOFOL;  Surgeon: [name], MD;  Location: [facility];  Service: Endoscopy;  Laterality: N/A;  . FOOT SURGERY     ingrown toenails    Past Medical History:  Diagnosis Date  . Carpal tunnel syndrome    bilateral, and neuropathy bilateral feet  . Colon cancer ([place]) 03/2008   surgery only  . Diabetes mellitus without complication ([place])   . Hx of seasonal allergies   . Hypertension   . Neuropathy ([place])   . Obesity    BMI 68.0 kg  . OSA (obstructive sleep apnea)    uses cpap nightly  . Reduced mobility    06-27-13 states can transfer self.limited on distances-uses walker and wheelchair mostly.   There were no vitals taken for this visit.  Opioid Risk Score:   Fall Risk Score:  `1  Depression screen PHQ 2/9  Depression  screen [facility] 2/9 06/21/2015 02/17/2015 10/19/2014 04/06/2014  Decreased Interest 0 2 2 2   Down, Depressed, Hopeless 0 2 2 2   PHQ - 2 Score 0 4 4 4   Altered sleeping - - - 3  Tired, decreased energy - - - 2  Change in appetite - - - 0  Feeling bad or failure about yourself  - - - 1  Trouble concentrating - - - 1  Moving slowly or fidgety/restless - - - 1  Suicidal thoughts - - - 0  PHQ-9 Score - - - 12   Review of Systems  Constitutional: Negative.   HENT: Negative.   Eyes: Negative.   Respiratory: Positive for apnea.   Cardiovascular: Negative.   Gastrointestinal: Positive for constipation.  Endocrine: Negative.   Genitourinary: Positive for difficulty urinating.   Musculoskeletal: Negative.   Skin: Positive for rash.  Allergic/Immunologic: Negative.   Neurological: Positive for numbness.       Tingling  Hematological: Negative.   Psychiatric/Behavioral: Positive for dysphoric mood.  All other systems reviewed and are negative.      Objective:   Physical Exam  Constitutional: He is oriented to person, place, and time. He appears well-developed and well-nourished.  HENT:  Head: Normocephalic and atraumatic.  Neck: Normal range of motion. Neck supple.  Cardiovascular: Normal rate and regular rhythm.   Pulmonary/Chest: Effort normal and breath sounds normal.  Musculoskeletal:  Normal Muscle Bulk and Muscle Testing Reveals: Upper Extremities: Full ROM and Muscle Strength 5/5 Lumbar Paraspinal Tenderness: L-3-L-5 Lower Extremities: Full ROM and Muscle Strength 5/5 Bilateral Lower Extremities Flexion Produces Pain into Patella's Arrived in Motorized wheelchair   Neurological: He is alert and oriented to person, place, and time.  Skin: Skin is warm and dry.  Psychiatric: He has a normal mood and affect.  Nursing note and vitals reviewed.         Assessment & Plan:  1. Chronic low back pain. Refilled: oxycodone 7.5/325mg  one tablet every 6 hours as needed #90 and MS CONTIN 30 mg one tablet three times daily. #90. Second script was given for the following month.  We will continue the opioid monitoring program, this consists of regular clinic visits, examinations, urine drug screen, pill counts as well as use of [place] controlled substance reporting system. 2. Osteoarthritis of the knees bilaterally. Continue current medication regime.  3. Morbid obesity. Encourage exercise and start eating healthy Living Diet regime.  4. Depression.Continue Cymbalta. Continue to Monitor  5. Type 2 DM with peripheral neuropathy :Continue to Monitor  20 minutes of face to face patient care time was spent during this visit. All questions were  encouraged and answered  F/U in 2 moinths

## 2015-12-30 LAB — 6-ACETYLMORPHINE,TOXASSURE ADD
6-ACETYLMORPHINE: NEGATIVE
6-ACETYLMORPHINE: NOT DETECTED ng/mg{creat}

## 2015-12-30 LAB — TOXASSURE SELECT,+ANTIDEPR,UR

## 2015-12-31 NOTE — Progress Notes (Signed)
Urine drug screen for this encounter is consistent for prescribed medication 

## 2016-01-02 DIAGNOSIS — M545 Low back pain: Secondary | ICD-10-CM | POA: Diagnosis not present

## 2016-01-11 DIAGNOSIS — E119 Type 2 diabetes mellitus without complications: Secondary | ICD-10-CM | POA: Diagnosis not present

## 2016-01-11 DIAGNOSIS — M129 Arthropathy, unspecified: Secondary | ICD-10-CM | POA: Diagnosis not present

## 2016-01-11 DIAGNOSIS — G5600 Carpal tunnel syndrome, unspecified upper limb: Secondary | ICD-10-CM | POA: Diagnosis not present

## 2016-01-11 DIAGNOSIS — E669 Obesity, unspecified: Secondary | ICD-10-CM | POA: Diagnosis not present

## 2016-01-24 DIAGNOSIS — L723 Sebaceous cyst: Secondary | ICD-10-CM | POA: Diagnosis not present

## 2016-02-01 DIAGNOSIS — L723 Sebaceous cyst: Secondary | ICD-10-CM | POA: Diagnosis not present

## 2016-02-28 ENCOUNTER — Encounter: Payer: Medicare HMO | Admitting: Physical Medicine & Rehabilitation

## 2016-02-29 ENCOUNTER — Encounter: Payer: Medicare HMO | Attending: Registered Nurse | Admitting: Registered Nurse

## 2016-02-29 ENCOUNTER — Encounter: Payer: Self-pay | Admitting: Registered Nurse

## 2016-02-29 ENCOUNTER — Telehealth: Payer: Self-pay | Admitting: Registered Nurse

## 2016-02-29 VITALS — BP 164/72 | HR 66

## 2016-02-29 DIAGNOSIS — F3289 Other specified depressive episodes: Secondary | ICD-10-CM

## 2016-02-29 DIAGNOSIS — M1712 Unilateral primary osteoarthritis, left knee: Secondary | ICD-10-CM | POA: Insufficient documentation

## 2016-02-29 DIAGNOSIS — G894 Chronic pain syndrome: Secondary | ICD-10-CM | POA: Diagnosis not present

## 2016-02-29 DIAGNOSIS — M25512 Pain in left shoulder: Secondary | ICD-10-CM

## 2016-02-29 DIAGNOSIS — M25511 Pain in right shoulder: Secondary | ICD-10-CM

## 2016-02-29 DIAGNOSIS — M47816 Spondylosis without myelopathy or radiculopathy, lumbar region: Secondary | ICD-10-CM | POA: Insufficient documentation

## 2016-02-29 DIAGNOSIS — E0843 Diabetes mellitus due to underlying condition with diabetic autonomic (poly)neuropathy: Secondary | ICD-10-CM

## 2016-02-29 DIAGNOSIS — M1711 Unilateral primary osteoarthritis, right knee: Secondary | ICD-10-CM | POA: Diagnosis not present

## 2016-02-29 MED ORDER — OXYCODONE-ACETAMINOPHEN 7.5-325 MG PO TABS: 1.0000 | ORAL_TABLET | Freq: Four times a day (QID) | ORAL | 0 refills | Status: DC | PRN

## 2016-02-29 MED ORDER — MORPHINE SULFATE ER 30 MG PO TBCR: 30.0000 mg | EXTENDED_RELEASE_TABLET | Freq: Three times a day (TID) | ORAL | 0 refills | Status: DC

## 2016-02-29 NOTE — Telephone Encounter (Signed)
On 02/29/2016 the  [place] was reviewed no conflict was seen on the [place] with multiple prescribers. Mr. Cupo has a signed narcotic contract with our office. If there were any discrepancies this would have been reported to his physician.

## 2016-02-29 NOTE — Progress Notes (Signed)
Subjective:    Patient ID: Brian Richmond, male    DOB: April 18, 1955, 61 y.o.   MRN: [id number]  HPI: Brian Richmond is a 61year old male who returns for follow up appointment for chronic pain and medication refill. He states his pain is located in his bilateral shoulders, bilateral hips,  bilateral lower extremities, bilateral knees and bilateral feet with numbness and tingling.  He rates his pain 8. His current exercise regime is walking in his home with walker.  He arrived in motorized wheelchair. Also states Gabapentin was ineffective in the past.    Pain Inventory Average Pain 8 Pain Right Now 8 My pain is constant, burning, dull and aching  In the last 24 hours, has pain interfered with the following? General activity 9 Relation with others 8 Enjoyment of life 8 What TIME of day is your pain at its worst? daytime Sleep (in general) Poor  Pain is worse with: walking Pain improves with: rest and medication Relief from Meds: 6  Mobility use a walker ability to climb steps?  yes do you drive?  yes use a wheelchair  Function disabled: date disabled . I need assistance with the following:  dressing  Neuro/Psych numbness tingling depression  Prior Studies Any changes since last visit?  no  Physicians involved in your care Any changes since last visit?  no   Family History  Problem Relation Age of Onset  . Ovarian cancer Mother   . Cancer Mother   . Heart disease Mother   . Arthritis Father    Social History   Social History  . Marital status: Married    Spouse name: N/A  . Number of children: y  . Years of education: N/A   Occupational History  . DISABLED Disabled   Social History Main Topics  . Smoking status: Never Smoker  . Smokeless tobacco: Never Used  . Alcohol use No  . Drug use: No  . Sexual activity: Yes   Other Topics Concern  . Not on file   Social History Narrative   Pt is married and lives with wife.     Past Surgical  History:  Procedure Laterality Date  . COLON SURGERY  03/2008  . COLONOSCOPY WITH PROPOFOL N/A 07/08/2013   Procedure: COLONOSCOPY WITH PROPOFOL;  Surgeon: [name], MD;  Location: [facility];  Service: Endoscopy;  Laterality: N/A;  . COLONOSCOPY WITH PROPOFOL N/A 10/13/2014   Procedure: COLONOSCOPY WITH PROPOFOL;  Surgeon: [name], MD;  Location: [facility];  Service: Endoscopy;  Laterality: N/A;  . FOOT SURGERY     ingrown toenails    Past Medical History:  Diagnosis Date  . Carpal tunnel syndrome    bilateral, and neuropathy bilateral feet  . Colon cancer ([place]) 03/2008   surgery only  . Diabetes mellitus without complication ([place])   . Hx of seasonal allergies   . Hypertension   . Neuropathy ([place])   . Obesity    BMI 68.0 kg  . OSA (obstructive sleep apnea)    uses cpap nightly  . Reduced mobility    06-27-13 states can transfer self.limited on distances-uses walker and wheelchair mostly.   There were no vitals taken for this visit.  Opioid Risk Score:   Fall Risk Score:  `1  Depression screen PHQ 2/9  Depression screen [facility] 2/9 06/21/2015 02/17/2015 10/19/2014 04/06/2014  Decreased Interest 0 2 2 2   Down, Depressed, Hopeless 0 2 2 2   PHQ - 2 Score  0 4 4 4   Altered sleeping - - - 3  Tired, decreased energy - - - 2  Change in appetite - - - 0  Feeling bad or failure about yourself  - - - 1  Trouble concentrating - - - 1  Moving slowly or fidgety/restless - - - 1  Suicidal thoughts - - - 0  PHQ-9 Score - - - 12    Review of Systems  Constitutional: Negative.   HENT: Negative.   Eyes: Negative.   Respiratory: Negative.   Cardiovascular: Negative.   Gastrointestinal: Positive for constipation.  Endocrine: Negative.   Genitourinary: Negative.   Musculoskeletal: Negative.   Skin: Positive for rash.  Allergic/Immunologic: Negative.   Neurological: Negative.   Hematological: Negative.   Psychiatric/Behavioral: Negative.   All other systems reviewed and  are negative.      Objective:   Physical Exam  Constitutional: He is oriented to person, place, and time. He appears well-developed and well-nourished.  HENT:  Head: Normocephalic and atraumatic.  Neck: Normal range of motion. Neck supple.  Cardiovascular: Normal rate and regular rhythm.   Pulmonary/Chest: Effort normal and breath sounds normal.  Musculoskeletal:  Normal Muscle Bulk and Muscle Testing Reveals: Upper Extremities: Full ROM and Muscle Strength 5/5 Back without spinal tenderness noted Lower Extremities: Full ROM and Muscle Strength 5/5 Bilateral Lower Extremities Flexion Produces Pain into Bilateral Patella's Arrived in wheelchair  Neurological: He is alert and oriented to person, place, and time.  Skin: Skin is warm and dry.  Psychiatric: He has a normal mood and affect.  Nursing note and vitals reviewed.         Assessment & Plan:  1. Chronic low back pain. Refilled: oxycodone 7.5/325mg  one tablet every 6 hours as needed #90 and MS CONTIN 30 mg one tablet three times daily. #90. Second script was given for the following month. 02/29/2016 We will continue the opioid monitoring program, this consists of regular clinic visits, examinations, urine drug screen, pill counts as well as use of [place] controlled substance reporting system. 2. Osteoarthritis of the knees bilaterally. Continue current medication regime. 02/29/2016 3. Morbid obesity. Encourage exercise and start eating healthy Living Diet regime. 02/29/2016 4. Depression.Continue Cymbalta.Continue to Monitor. 02/29/2016 5. Type 2 DM with peripheral neuropathy :Continue to Monitor. 02/29/2016  20 minutes of face to face patient care time was spent during this visit. All questions were encouraged and answered.  F/U in 2 moinths

## 2016-03-29 DIAGNOSIS — Z7984 Long term (current) use of oral hypoglycemic drugs: Secondary | ICD-10-CM | POA: Diagnosis not present

## 2016-03-29 DIAGNOSIS — E782 Mixed hyperlipidemia: Secondary | ICD-10-CM | POA: Diagnosis not present

## 2016-03-29 DIAGNOSIS — E039 Hypothyroidism, unspecified: Secondary | ICD-10-CM | POA: Diagnosis not present

## 2016-03-29 DIAGNOSIS — G8929 Other chronic pain: Secondary | ICD-10-CM | POA: Diagnosis not present

## 2016-03-29 DIAGNOSIS — I10 Essential (primary) hypertension: Secondary | ICD-10-CM | POA: Diagnosis not present

## 2016-03-29 DIAGNOSIS — G4730 Sleep apnea, unspecified: Secondary | ICD-10-CM | POA: Diagnosis not present

## 2016-03-29 DIAGNOSIS — E1140 Type 2 diabetes mellitus with diabetic neuropathy, unspecified: Secondary | ICD-10-CM | POA: Diagnosis not present

## 2016-04-02 ENCOUNTER — Other Ambulatory Visit: Payer: Self-pay | Admitting: Physical Medicine & Rehabilitation

## 2016-04-27 DIAGNOSIS — G4733 Obstructive sleep apnea (adult) (pediatric): Secondary | ICD-10-CM | POA: Diagnosis not present

## 2016-05-03 ENCOUNTER — Encounter: Payer: Self-pay | Admitting: Physical Medicine & Rehabilitation

## 2016-05-03 ENCOUNTER — Encounter: Payer: Medicare HMO | Attending: Registered Nurse | Admitting: Physical Medicine & Rehabilitation

## 2016-05-03 VITALS — BP 139/80 | HR 60

## 2016-05-03 DIAGNOSIS — M1712 Unilateral primary osteoarthritis, left knee: Secondary | ICD-10-CM | POA: Diagnosis not present

## 2016-05-03 DIAGNOSIS — M47816 Spondylosis without myelopathy or radiculopathy, lumbar region: Secondary | ICD-10-CM | POA: Diagnosis not present

## 2016-05-03 DIAGNOSIS — Z79899 Other long term (current) drug therapy: Secondary | ICD-10-CM

## 2016-05-03 DIAGNOSIS — Z5181 Encounter for therapeutic drug level monitoring: Secondary | ICD-10-CM

## 2016-05-03 DIAGNOSIS — M1711 Unilateral primary osteoarthritis, right knee: Secondary | ICD-10-CM | POA: Diagnosis not present

## 2016-05-03 DIAGNOSIS — G894 Chronic pain syndrome: Secondary | ICD-10-CM

## 2016-05-03 MED ORDER — OXYCODONE-ACETAMINOPHEN 7.5-325 MG PO TABS: 1.0000 | ORAL_TABLET | Freq: Four times a day (QID) | ORAL | 0 refills | Status: DC | PRN

## 2016-05-03 MED ORDER — MORPHINE SULFATE ER 30 MG PO TBCR: 30.0000 mg | EXTENDED_RELEASE_TABLET | Freq: Three times a day (TID) | ORAL | 0 refills | Status: DC

## 2016-05-03 NOTE — Progress Notes (Signed)
Subjective:    Patient ID: Brian Richmond, male    DOB: 1955/07/30, 61 y.o.   MRN: [id number]  HPI   Brian Richmond is here in follow up of his chronic pain. He states his pain levels have been fairly stable. He has his ups and downs in regard to his pain levels. His bowels and bladder have remained regular. He is limited by weight and pain. He uses his power scooter as his primary means of transportation.   His health has been stable. 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   His bowel and bladder function has been normal.    Pain Inventory Average Pain 7 Pain Right Now 7 My pain is constant, burning, dull and aching  In the last 24 hours, has pain interfered with the following? General activity 9 Relation with others 9 Enjoyment of life 9 What TIME of day is your pain at its worst? night Sleep (in general) Fair  Pain is worse with: walking and standing Pain improves with: rest and medication Relief from Meds: 6  Mobility use a walker ability to climb steps?  yes do you drive?  yes  Function disabled: date disabled . I need assistance with the following:  bathing  Neuro/Psych bladder control problems numbness tingling depression  Prior Studies Any changes since last visit?  no  Physicians involved in your care Any changes since last visit?  no   Family History  Problem Relation Age of Onset  . Ovarian cancer Mother   . Cancer Mother   . Heart disease Mother   . Arthritis Father    Social History   Social History  . Marital status: Married    Spouse name: N/A  . Number of children: y  . Years of education: N/A   Occupational History  . DISABLED Disabled   Social History Main Topics  . Smoking status: Never Smoker  . Smokeless tobacco: Never Used  . Alcohol use No  . Drug use: No  . Sexual activity: Yes   Other Topics Concern  . Not on file   Social History Narrative   Pt is married and lives with wife.      Past Surgical History:  Procedure Laterality Date  . COLON SURGERY  03/2008  . COLONOSCOPY WITH PROPOFOL N/A 07/08/2013   Procedure: COLONOSCOPY WITH PROPOFOL;  Surgeon: [name], MD;  [facility];  Service: Endoscopy;  Laterality: N/A;  . COLONOSCOPY WITH PROPOFOL N/A 10/13/2014   Procedure: COLONOSCOPY WITH PROPOFOL;  Surgeon: [name], MD;  Location: [facility];  Service: Endoscopy;  Laterality: N/A;  . FOOT SURGERY     ingrown toenails    Past Medical History:  Diagnosis Date  . Carpal tunnel syndrome    bilateral, and neuropathy bilateral feet  . Colon cancer ([place]) 03/2008   surgery only  . Diabetes mellitus without complication ([place])   . Hx of seasonal allergies   . Hypertension   . Neuropathy ([place])   . Obesity    BMI 68.0 kg  . OSA (obstructive sleep apnea)    uses cpap nightly  . Reduced mobility    06-27-13 states can transfer self.limited on distances-uses walker and wheelchair mostly.   There were no vitals taken for this visit.  Opioid Risk Score:   Fall Risk Score:  `1  Depression screen PHQ 2/9  Depression screen [facility] 2/9 06/21/2015 02/17/2015 10/19/2014 04/06/2014  Decreased Interest 0 2 2 2  Down, Depressed, Hopeless 0 2 2 2   PHQ - 2 Score 0 4 4 4   Altered sleeping - - - 3  Tired, decreased energy - - - 2  Change in appetite - - - 0  Feeling bad or failure about yourself  - - - 1  Trouble concentrating - - - 1  Moving slowly or fidgety/restless - - - 1  Suicidal thoughts - - - 0  PHQ-9 Score - - - 12    Review of Systems  Constitutional: Negative.   HENT: Negative.   Eyes: Negative.   Respiratory: Negative.   Cardiovascular: Negative.   Gastrointestinal: Negative.   Endocrine: Negative.   Genitourinary: Negative.   Musculoskeletal: Negative.   Skin: Negative.   Allergic/Immunologic: Negative.   Neurological: Negative.   Hematological: Negative.   Psychiatric/Behavioral: Negative.   All other systems reviewed and are  negative.      Objective:   Physical Exam  Constitutional: He is oriented to person, place, and time. He appears well-developed and well-nourished. Has long hair and beard  Morbidly obese--no change HENT:  Head: Normocephalic and atraumatic.  Mouth/Throat: Oropharynx is clear and moist.  Eyes: Conjunctivae and EOM are normal. Pupils are equal, round, and reactive to light.  Neck: Normal range of motion.  Cardiovascular: RRR  Pulmonary/Chest: normal effort Abdominal: Soft. He exhibits no distension.  Musculoskeletal:  Knees with effusion and crepitus with ROM. Hips are tender.  Lumbar spine TTP. rom limited. Exam limited by size/wheelcair Neurological: He is alert and oriented to person, place, and time. Decreased PP and LT in feet. Psychiatric: His behavior is normal. Judgment and thought content normal.  Affect is bright. In good spirits.   Assessment & Plan:  ASSESSMENT:  1. Chronic low back pain.  2. Osteoarthritis of the knees bilaterally.  3. Morbid obesity.  4. Depression.  5. Type 2 DM with peripheral neuropathy  6. Diffuse myalgias--due to statin---resolved    PLAN:  1. I rx'ed 2 prescriptions:  Percocet 7.5/325 one p.o. q.8 hours p.r.n., #90 with no refill. and MS Contin CR   q8 hours, #90 with no refills.   Second rx'es were given for next month. We will continue the opioid monitoring program, this consists of regular clinic visits, examinations, urine drug screen, pill counts as well as use of [place] Controlled Substance Reporting System. [place] was [place] today. UDS was collected today 2. voltaren gel, ice to knees 3. Continue cymbalta---starting at 30mg  daily. Testosterone and thyroid per primary.      15 minutes of face to face patient care time were spent during this visit. All questions were encouraged and answered.  Follow up in 2 months with NP

## 2016-05-03 NOTE — Patient Instructions (Signed)
PLEASE FEEL FREE TO CALL OUR OFFICE WITH ANY PROBLEMS OR QUESTIONS ([phone])      

## 2016-05-08 LAB — TOXASSURE SELECT,+ANTIDEPR,UR

## 2016-05-08 LAB — 6-ACETYLMORPHINE,TOXASSURE ADD
6-ACETYLMORPHINE: NEGATIVE
6-acetylmorphine: NOT DETECTED ng/mg creat

## 2016-05-09 DIAGNOSIS — G609 Hereditary and idiopathic neuropathy, unspecified: Secondary | ICD-10-CM | POA: Diagnosis not present

## 2016-05-09 DIAGNOSIS — E1140 Type 2 diabetes mellitus with diabetic neuropathy, unspecified: Secondary | ICD-10-CM | POA: Diagnosis not present

## 2016-05-09 DIAGNOSIS — G4733 Obstructive sleep apnea (adult) (pediatric): Secondary | ICD-10-CM | POA: Diagnosis not present

## 2016-05-09 DIAGNOSIS — G589 Mononeuropathy, unspecified: Secondary | ICD-10-CM | POA: Diagnosis not present

## 2016-05-09 DIAGNOSIS — M542 Cervicalgia: Secondary | ICD-10-CM | POA: Diagnosis not present

## 2016-05-09 DIAGNOSIS — M2550 Pain in unspecified joint: Secondary | ICD-10-CM | POA: Diagnosis not present

## 2016-05-09 DIAGNOSIS — G4730 Sleep apnea, unspecified: Secondary | ICD-10-CM | POA: Diagnosis not present

## 2016-05-10 ENCOUNTER — Telehealth: Payer: Self-pay | Admitting: *Deleted

## 2016-05-10 NOTE — Telephone Encounter (Signed)
Urine drug screen for this encounter is consistent for prescribed medication. Duloxetine is not a controlled substance and uncertain if he was taking this medication at time of test.

## 2016-05-16 DIAGNOSIS — E119 Type 2 diabetes mellitus without complications: Secondary | ICD-10-CM | POA: Diagnosis not present

## 2016-06-08 DIAGNOSIS — E1140 Type 2 diabetes mellitus with diabetic neuropathy, unspecified: Secondary | ICD-10-CM | POA: Diagnosis not present

## 2016-06-08 DIAGNOSIS — G4733 Obstructive sleep apnea (adult) (pediatric): Secondary | ICD-10-CM | POA: Diagnosis not present

## 2016-06-08 DIAGNOSIS — G609 Hereditary and idiopathic neuropathy, unspecified: Secondary | ICD-10-CM | POA: Diagnosis not present

## 2016-06-08 DIAGNOSIS — G589 Mononeuropathy, unspecified: Secondary | ICD-10-CM | POA: Diagnosis not present

## 2016-06-08 DIAGNOSIS — G4730 Sleep apnea, unspecified: Secondary | ICD-10-CM | POA: Diagnosis not present

## 2016-06-08 DIAGNOSIS — M2550 Pain in unspecified joint: Secondary | ICD-10-CM | POA: Diagnosis not present

## 2016-06-09 DIAGNOSIS — M25561 Pain in right knee: Secondary | ICD-10-CM | POA: Diagnosis not present

## 2016-06-09 DIAGNOSIS — M25512 Pain in left shoulder: Secondary | ICD-10-CM | POA: Diagnosis not present

## 2016-06-09 DIAGNOSIS — L989 Disorder of the skin and subcutaneous tissue, unspecified: Secondary | ICD-10-CM | POA: Diagnosis not present

## 2016-06-26 ENCOUNTER — Ambulatory Visit (INDEPENDENT_AMBULATORY_CARE_PROVIDER_SITE_OTHER): Payer: Medicare HMO

## 2016-06-26 ENCOUNTER — Ambulatory Visit (INDEPENDENT_AMBULATORY_CARE_PROVIDER_SITE_OTHER): Payer: Medicare HMO | Admitting: Orthopaedic Surgery

## 2016-06-26 DIAGNOSIS — M25511 Pain in right shoulder: Secondary | ICD-10-CM | POA: Diagnosis not present

## 2016-06-26 DIAGNOSIS — M25562 Pain in left knee: Secondary | ICD-10-CM | POA: Diagnosis not present

## 2016-06-26 DIAGNOSIS — M542 Cervicalgia: Secondary | ICD-10-CM | POA: Insufficient documentation

## 2016-06-26 DIAGNOSIS — M25561 Pain in right knee: Secondary | ICD-10-CM

## 2016-06-26 DIAGNOSIS — G8929 Other chronic pain: Secondary | ICD-10-CM | POA: Diagnosis not present

## 2016-06-26 DIAGNOSIS — M25512 Pain in left shoulder: Secondary | ICD-10-CM | POA: Diagnosis not present

## 2016-06-26 MED ORDER — BUPIVACAINE HCL 0.5 % IJ SOLN
3.0000 mL | INTRAMUSCULAR | Status: AC | PRN
  Administered 2016-06-26: 3 mL via INTRA_ARTICULAR

## 2016-06-26 MED ORDER — LIDOCAINE HCL 1 % IJ SOLN
3.0000 mL | INTRAMUSCULAR | Status: AC | PRN
  Administered 2016-06-26: 3 mL

## 2016-06-26 MED ORDER — CYCLOBENZAPRINE HCL 10 MG PO TABS: 10.0000 mg | ORAL_TABLET | Freq: Three times a day (TID) | ORAL | 0 refills | Status: AC | PRN

## 2016-06-26 MED ORDER — METHYLPREDNISOLONE ACETATE 40 MG/ML IJ SUSP
40.0000 mg | INTRAMUSCULAR | Status: AC | PRN
  Administered 2016-06-26: 40 mg via INTRA_ARTICULAR

## 2016-06-26 NOTE — Progress Notes (Addendum)
Office Visit Note   Patient: Brian Richmond           Date of Birth: 1955/11/30           MRN: [id number] Visit Date: 06/26/2016              Requested by: Brian Fess, MD [address], [place] [id number] PCP: Brian Fess, MD   Assessment & Plan: Visit Diagnoses:  1. Chronic pain of both knees   2. Neck pain   3. Chronic pain of both shoulders     Plan: Overall impression is right knee osteoarthritis and left shoulder rotator cuff tendinosis with subacromial bursitis. Subacromial injection was attempted today. Patient tolerates well. Follow-up with me as needed. Flexeril was prescribed for his muscular pain.  Patient has chronic neck and knee and shoulder pain as well as morbid obesity with BMI of 69.5. Patient has tried multiple assistive devices including a cane, walker, manual power chair and she is unable to safely uses devices. She has significant difficulty with ADLs such as dressing and grooming in home management. Given her morbid obesity I think she would be unsafe with a cane and walker. A manual wheelchair would be very difficult on her given her body habitus and shortness of breath on exertion. A power chair will improve her condition because of her physical limitations. Her upper and lower extremity strength is 4 minus out of 5. Patient cannot safely use a scooter with a steering tiller cousin her morbid obesity and inability to tolerate upper body exertion. Patient is oriented and able to safely use a power chair at home.  Follow-Up Instructions: Return if symptoms worsen or fail to improve.   Orders:  Orders Placed This Encounter  Procedures  . XR Knee 1-2 Views Right  . XR Knee 1-2 Views Left  . XR Cervical Spine 2 or 3 views  . XR Shoulder Right  . XR Shoulder Left   Meds ordered this encounter  Medications  . cyclobenzaprine (FLEXERIL) 10 MG tablet    Sig: Take 1 tablet (10 mg total) by mouth 3 (three) times daily as needed for muscle spasms.   Dispense:  30 tablet    Refill:  0      Procedures: Large Joint Inj Date/Time: 06/26/2016 3:27 PM Performed by: Brian Richmond Authorized by: Brian Richmond   Consent Given by:  Patient Timeout: prior to procedure the correct patient, procedure, and site was verified   Location:  Shoulder Site:  L subacromial bursa Prep: patient was prepped and draped in usual sterile fashion   Needle Size:  22 G Approach:  Posterior Ultrasound Guidance: No   Fluoroscopic Guidance: No   Arthrogram: No   Medications:  3 mL lidocaine 1 %; 3 mL bupivacaine 0.5 %; 40 mg methylPREDNISolone acetate 40 MG/ML     Clinical Data: No additional findings.   Subjective: Chief complaint: Face-to-face mobility evaluation  Patient is 61 year old gentleman who is morbidly obese comes in with bilateral knee pain and bilateral shoulder pain and neck pain. He states his left shoulder bothering him quite a bit. He was pulling a wagon and is doing a lot of repetitions and has had discomfort and pain and he's had trouble with ADLs. This is been going on for about 5 weeks now. His right knee also feels like Jell-O. He does know he has arthritis and cortisone injections in the past which did not give him prolonged relief. He denies any numbness  or tingling.    Review of Systems  Constitutional: Negative.   All other systems reviewed and are negative.    Objective: Vital Signs: There were no vitals taken for this visit.  Physical Exam  Constitutional: He is oriented to person, place, and time. He appears well-developed and well-nourished.  HENT:  Head: Normocephalic and atraumatic.  Eyes: Pupils are equal, round, and reactive to light.  Neck: Neck supple.  Pulmonary/Chest: Effort normal.  Abdominal: Soft.  Musculoskeletal: Normal range of motion.  Neurological: He is alert and oriented to person, place, and time.  Skin: Skin is warm.  Psychiatric: He has a normal mood and affect. His behavior is normal.  Judgment and thought content normal.  Nursing note and vitals reviewed.   Ortho Exam Cervical spine exam shows mainly muscular discomfort with palpation. No focal findings deformities. Shoulder exam is positive for impingement and pain and weakness with rotator cuff testing. Rotator cuff is grossly intact. Right knee exam shows no joint effusion although this is very hard to decipher given the patient's morbid obesity. There is no skin lesions no adverse features. Specialty Comments:  No specialty comments available.  Imaging: Xr Knee 1-2 Views Left  Result Date: 06/26/2016 Degenerative joint disease  Xr Cervical Spine 2 Or 3 Views  Result Date: 06/26/2016 Degenerative disc disease and cervical spondylosis  Xr Knee 1-2 Views Right  Result Date: 06/26/2016 Advanced degenerative joint disease.  Xr Shoulder Left  Result Date: 06/26/2016 No acute structural abnormalities  Xr Shoulder Right  Result Date: 06/26/2016 No acute structural abnormalities    PMFS History: Patient Active Problem List   Diagnosis Date Noted  . Chronic pain of both knees 06/26/2016  . Neck pain 06/26/2016  . Arthralgia of right acromioclavicular joint 10/15/2013  . Diabetes mellitus with diabetic neuropathy ([place]) 02/19/2013  . Depression 05/22/2011  . Morbid obesity ([place]) 05/22/2011  . Lumbar spondylosis 03/22/2011  . Osteoarthritis of left knee 03/22/2011  . Osteoarthritis of right knee 03/22/2011  . OSA (obstructive sleep apnea) 12/06/2010   Past Medical History:  Diagnosis Date  . Carpal tunnel syndrome    bilateral, and neuropathy bilateral feet  . Colon cancer ([place]) 03/2008   surgery only  . Diabetes mellitus without complication ([place])   . Hx of seasonal allergies   . Hypertension   . Neuropathy   . Obesity    BMI 68.0 kg  . OSA (obstructive sleep apnea)    uses cpap nightly  . Reduced mobility    06-27-13 states can transfer self.limited on distances-uses walker and wheelchair  mostly.    Family History  Problem Relation Age of Onset  . Ovarian cancer Mother   . Cancer Mother   . Heart disease Mother   . Arthritis Father     Past Surgical History:  Procedure Laterality Date  . COLON SURGERY  03/2008  . COLONOSCOPY WITH PROPOFOL N/A 07/08/2013   Procedure: COLONOSCOPY WITH PROPOFOL;  Surgeon: [name], MD;  Location: [facility];  Service: Endoscopy;  Laterality: N/A;  . COLONOSCOPY WITH PROPOFOL N/A 10/13/2014   Procedure: COLONOSCOPY WITH PROPOFOL;  Surgeon: [name], MD;  Location: [facility];  Service: Endoscopy;  Laterality: N/A;  . FOOT SURGERY     ingrown toenails    Social History   Occupational History  . DISABLED Disabled   Social History Main Topics  . Smoking status: Never Smoker  . Smokeless tobacco: Never Used  . Alcohol use No  . Drug use: No  .  Sexual activity: Yes

## 2016-07-03 ENCOUNTER — Encounter: Payer: Medicare HMO | Attending: Registered Nurse | Admitting: Registered Nurse

## 2016-07-03 ENCOUNTER — Encounter: Payer: Self-pay | Admitting: Registered Nurse

## 2016-07-03 VITALS — BP 127/89 | HR 76

## 2016-07-03 DIAGNOSIS — F3289 Other specified depressive episodes: Secondary | ICD-10-CM

## 2016-07-03 DIAGNOSIS — M1711 Unilateral primary osteoarthritis, right knee: Secondary | ICD-10-CM | POA: Diagnosis not present

## 2016-07-03 DIAGNOSIS — M47816 Spondylosis without myelopathy or radiculopathy, lumbar region: Secondary | ICD-10-CM | POA: Diagnosis not present

## 2016-07-03 DIAGNOSIS — M25511 Pain in right shoulder: Secondary | ICD-10-CM

## 2016-07-03 DIAGNOSIS — M1712 Unilateral primary osteoarthritis, left knee: Secondary | ICD-10-CM | POA: Diagnosis not present

## 2016-07-03 DIAGNOSIS — M25512 Pain in left shoulder: Secondary | ICD-10-CM

## 2016-07-03 DIAGNOSIS — E0843 Diabetes mellitus due to underlying condition with diabetic autonomic (poly)neuropathy: Secondary | ICD-10-CM

## 2016-07-03 DIAGNOSIS — G894 Chronic pain syndrome: Secondary | ICD-10-CM | POA: Diagnosis not present

## 2016-07-03 DIAGNOSIS — Z5181 Encounter for therapeutic drug level monitoring: Secondary | ICD-10-CM | POA: Diagnosis not present

## 2016-07-03 DIAGNOSIS — Z79899 Other long term (current) drug therapy: Secondary | ICD-10-CM | POA: Diagnosis not present

## 2016-07-03 MED ORDER — MORPHINE SULFATE ER 30 MG PO TBCR: 30.0000 mg | EXTENDED_RELEASE_TABLET | Freq: Three times a day (TID) | ORAL | 0 refills | Status: DC

## 2016-07-03 MED ORDER — MORPHINE SULFATE ER 30 MG PO TBCR: 30.0000 mg | EXTENDED_RELEASE_TABLET | Freq: Three times a day (TID) | ORAL | 0 refills | Status: AC

## 2016-07-03 MED ORDER — GABAPENTIN 100 MG PO CAPS: 100.0000 mg | ORAL_CAPSULE | Freq: Three times a day (TID) | ORAL | 0 refills | Status: DC

## 2016-07-03 MED ORDER — OXYCODONE-ACETAMINOPHEN 7.5-325 MG PO TABS: 1.0000 | ORAL_TABLET | Freq: Four times a day (QID) | ORAL | 0 refills | Status: AC | PRN

## 2016-07-03 MED ORDER — OXYCODONE-ACETAMINOPHEN 7.5-325 MG PO TABS: 1.0000 | ORAL_TABLET | Freq: Four times a day (QID) | ORAL | 0 refills | Status: DC | PRN

## 2016-07-03 NOTE — Progress Notes (Signed)
Subjective:    Patient ID: Brian Richmond, male    DOB: 1955/05/15, 61 y.o.   MRN: [id number]  HPI:  Mr. KALEV Richmond is a 61year old male who returns for follow up appointment for chronic pain and medication refill. He states his pain is located in his left shoulder, bilateral knees R>L and bilateral feet with numbness and tingling. We discussed Gabapentin, he is in agreement with staring Gabapentin. Prescribed Gabapentin 100 mg capsule at HS, call the office on Firday if pain Persists increase Gabapentin to one capsule in the morning and at HS. He verbalizes understanding. He rates his pain 8. His current exercise regime is walking in his home with walker.   Also states he fell last week while getting out of the bed, he fell backwards on box-spring, he was able to brace himself from falling on the floor.    States he was hauling mulch in a wagon, and noticed increase intensity of left shoulder pain. He seen his Orthopedist and received a cortisone injection, he states.   He arrived in motorized wheelchair.  Last UDS was performed on 05/03/2016, it was consistent.     Pain Inventory Average Pain 6 Pain Right Now 8 My pain is constant, sharp, burning, dull, tingling and aching  In the last 24 hours, has pain interfered with the following? General activity 10 Relation with others 10 Enjoyment of life 10 What TIME of day is your pain at its worst? daytime Sleep (in general) Poor  Pain is worse with: walking and standing Pain improves with: rest and medication Relief from Meds: 6  Mobility use a walker ability to climb steps?  no do you drive?  yes use a wheelchair  Function disabled: date disabled . I need assistance with the following:  bathing and meal prep  Neuro/Psych bladder control problems numbness tingling trouble walking  Prior Studies Any changes since last visit?  no  Physicians involved in your care Any changes since last visit?  no   Family  History  Problem Relation Age of Onset  . Ovarian cancer Mother   . Cancer Mother   . Heart disease Mother   . Arthritis Father    Social History   Social History  . Marital status: Married    Spouse name: N/A  . Number of children: y  . Years of education: N/A   Occupational History  . DISABLED Disabled   Social History Main Topics  . Smoking status: Never Smoker  . Smokeless tobacco: Never Used  . Alcohol use No  . Drug use: No  . Sexual activity: Yes   Other Topics Concern  . None   Social History Narrative   Pt is married and lives with wife.     Past Surgical History:  Procedure Laterality Date  . COLON SURGERY  03/2008  . COLONOSCOPY WITH PROPOFOL N/A 07/08/2013   Procedure: COLONOSCOPY WITH PROPOFOL;  Surgeon: [name], MD;  Location: [facility];  Service: Endoscopy;  Laterality: N/A;  . COLONOSCOPY WITH PROPOFOL N/A 10/13/2014   Procedure: COLONOSCOPY WITH PROPOFOL;  Surgeon: [name], MD;  Location: [facility];  Service: Endoscopy;  Laterality: N/A;  . FOOT SURGERY     ingrown toenails    Past Medical History:  Diagnosis Date  . Carpal tunnel syndrome    bilateral, and neuropathy bilateral feet  . Colon cancer ([place]) 03/2008   surgery only  . Diabetes mellitus without complication ([place])   . Hx  of seasonal allergies   . Hypertension   . Neuropathy   . Obesity    BMI 68.0 kg  . OSA (obstructive sleep apnea)    uses cpap nightly  . Reduced mobility    06-27-13 states can transfer self.limited on distances-uses walker and wheelchair mostly.   BP 127/89   Pulse 76   SpO2 93%   Opioid Risk Score:   Fall Risk Score:  `1  Depression screen PHQ 2/9  Depression screen [facility] 2/9 07/03/2016 06/21/2015 02/17/2015 10/19/2014 04/06/2014  Decreased Interest 2 0 2 2 2   Down, Depressed, Hopeless 2 0 2 2 2   PHQ - 2 Score 4 0 4 4 4   Altered sleeping - - - - 3  Tired, decreased energy - - - - 2  Change in appetite - - - - 0  Feeling bad or failure  about yourself  - - - - 1  Trouble concentrating - - - - 1  Moving slowly or fidgety/restless - - - - 1  Suicidal thoughts - - - - 0  PHQ-9 Score - - - - 12    Review of Systems  Constitutional: Negative.   HENT: Negative.   Eyes: Negative.   Respiratory: Positive for apnea.   Cardiovascular: Negative.   Gastrointestinal: Positive for constipation.  Endocrine: Negative.   Genitourinary: Positive for difficulty urinating.  Musculoskeletal: Positive for joint swelling.  Skin: Negative.   Allergic/Immunologic: Negative.   Neurological: Positive for numbness.  Hematological: Negative.   Psychiatric/Behavioral: Negative.   All other systems reviewed and are negative.      Objective:   Physical Exam  Constitutional: He is oriented to person, place, and time. He appears well-developed and well-nourished.  HENT:  Head: Normocephalic and atraumatic.  Neck: Normal range of motion. Neck supple.  Cardiovascular: Normal rate and regular rhythm.   Pulmonary/Chest: Effort normal and breath sounds normal.  Musculoskeletal:  Normal Muscle Bulk and Muscle Testing Reveals: Upper Extremities: Right: Full ROM and Muscle Strength 5/5 Left: Decreased ROM and Muscle Strength 4/5 Bilateral AC Joint Tenderness Thoracic Paraspinal Tenderness: T-1-T-3 Mainly Left Sid Lower Extremities: Full ROM and Muscle Strength 5/5 Bilateral Lower Extremity Flexion Produces Pain into Patella's and Lower Extremities Arrived in Motorized Wheelchair  Neurological: He is alert and oriented to person, place, and time.  Skin: Skin is warm and dry.  Psychiatric: He has a normal mood and affect.  Nursing note and vitals reviewed.         Assessment & Plan:  1. Chronic low back pain. Refilled: oxycodone 7.5/325mg  one tablet every 6 hours as needed #90 and MS CONTIN 30 mg one tablet three times daily. #90. Second script was given for the following month. 07/03/2016 We will continue the opioid monitoring program,  this consists of regular clinic visits, examinations, urine drug screen, pill counts as well as use of [place] controlled substance reporting system. 2. Osteoarthritis of the knees bilaterally. Continue current medication regime. 07/03/2016 3. Morbid obesity. Encourage exercise and start eating healthy Living Diet regime. 07/03/2016 4. Depression.Continue Cymbalta.Continue to Monitor. 07/03/2016 5. Type 2 DM with peripheral neuropathy : RX: Gabapentin one capsule at HS. Call office on Friday 07/07/2016 . If pain persists increase Gabapentin to one capsule in the morning and one capsule at HS. Continue to Monitor. 07/04/2016  30 minutes of face to face patient care time was spent during this visit. All questions were encouraged and answered.    F/U in 2 moinths

## 2016-07-03 NOTE — Patient Instructions (Signed)
Start Gabapentin Tonight for Nerve Pain  Take One Capsule at bedtime, call the office on Thursday to evaluate medication.

## 2016-07-04 ENCOUNTER — Telehealth: Payer: Self-pay | Admitting: Registered Nurse

## 2016-07-04 NOTE — Telephone Encounter (Signed)
On 07/04/2016 the  [place] was reviewed no conflict was seen on the [place] with multiple prescribers. MR. Stoudt has a Photographer with our office. If there were any discrepancies this would have been reported to his physician.

## 2016-07-09 DIAGNOSIS — G609 Hereditary and idiopathic neuropathy, unspecified: Secondary | ICD-10-CM | POA: Diagnosis not present

## 2016-07-09 DIAGNOSIS — G4730 Sleep apnea, unspecified: Secondary | ICD-10-CM | POA: Diagnosis not present

## 2016-07-09 DIAGNOSIS — G4733 Obstructive sleep apnea (adult) (pediatric): Secondary | ICD-10-CM | POA: Diagnosis not present

## 2016-07-09 DIAGNOSIS — E1140 Type 2 diabetes mellitus with diabetic neuropathy, unspecified: Secondary | ICD-10-CM | POA: Diagnosis not present

## 2016-07-09 DIAGNOSIS — G589 Mononeuropathy, unspecified: Secondary | ICD-10-CM | POA: Diagnosis not present

## 2016-07-09 DIAGNOSIS — M2550 Pain in unspecified joint: Secondary | ICD-10-CM | POA: Diagnosis not present

## 2016-07-10 ENCOUNTER — Telehealth: Payer: Self-pay | Admitting: Registered Nurse

## 2016-07-10 ENCOUNTER — Telehealth: Payer: Self-pay | Admitting: *Deleted

## 2016-07-10 NOTE — Telephone Encounter (Signed)
Return Mr. [place]oux call no answer, left message to return the call.

## 2016-07-10 NOTE — Telephone Encounter (Signed)
Patient called and left voicemail that he was to call and talk to me regarding his RX on Monday [phone] ? inbasket to ET for follow up

## 2016-07-10 NOTE — Telephone Encounter (Signed)
I spoke to [place] and he has not had noticeable improvement after the one 100 mg gabapentin at night.  Per her note we will increase him to bid.  He agrees to the increase.

## 2016-07-11 ENCOUNTER — Other Ambulatory Visit: Payer: Self-pay | Admitting: Physical Medicine & Rehabilitation

## 2016-07-27 ENCOUNTER — Telehealth (INDEPENDENT_AMBULATORY_CARE_PROVIDER_SITE_OTHER): Payer: Self-pay | Admitting: Orthopaedic Surgery

## 2016-07-27 NOTE — Telephone Encounter (Signed)
Called patient advised Rx for wheelchair is ready for pick up at the front desk.

## 2016-07-27 NOTE — Telephone Encounter (Signed)
PLEASE CALL PT BACK REGARDING HIM OBTAINING WHEELCHAIR.   [phone]

## 2016-07-27 NOTE — Telephone Encounter (Signed)
yes

## 2016-07-27 NOTE — Telephone Encounter (Signed)
Okay for patient to get wheelchair?

## 2016-08-02 DIAGNOSIS — N390 Urinary tract infection, site not specified: Secondary | ICD-10-CM | POA: Diagnosis not present

## 2016-08-02 DIAGNOSIS — M25851 Other specified joint disorders, right hip: Secondary | ICD-10-CM | POA: Diagnosis not present

## 2016-08-02 DIAGNOSIS — R350 Frequency of micturition: Secondary | ICD-10-CM | POA: Diagnosis not present

## 2016-08-08 ENCOUNTER — Telehealth (INDEPENDENT_AMBULATORY_CARE_PROVIDER_SITE_OTHER): Payer: Self-pay | Admitting: Orthopaedic Surgery

## 2016-08-08 DIAGNOSIS — E1140 Type 2 diabetes mellitus with diabetic neuropathy, unspecified: Secondary | ICD-10-CM | POA: Diagnosis not present

## 2016-08-08 DIAGNOSIS — G609 Hereditary and idiopathic neuropathy, unspecified: Secondary | ICD-10-CM | POA: Diagnosis not present

## 2016-08-08 DIAGNOSIS — G4730 Sleep apnea, unspecified: Secondary | ICD-10-CM | POA: Diagnosis not present

## 2016-08-08 DIAGNOSIS — M2550 Pain in unspecified joint: Secondary | ICD-10-CM | POA: Diagnosis not present

## 2016-08-08 DIAGNOSIS — G4733 Obstructive sleep apnea (adult) (pediatric): Secondary | ICD-10-CM | POA: Diagnosis not present

## 2016-08-08 DIAGNOSIS — G589 Mononeuropathy, unspecified: Secondary | ICD-10-CM | POA: Diagnosis not present

## 2016-08-08 NOTE — Telephone Encounter (Signed)
Patient's grandson came by to check on the status on the patient's wheelchair form.  CB#[phone].  Thank you.

## 2016-08-09 NOTE — Telephone Encounter (Signed)
Forms/notes ready for pick up at the front desk. Called patient no answer. Left voicemail.

## 2016-08-23 ENCOUNTER — Emergency Department (HOSPITAL_COMMUNITY): Payer: Medicare HMO

## 2016-08-23 ENCOUNTER — Observation Stay (HOSPITAL_COMMUNITY)
Admission: EM | Admit: 2016-08-23 | Discharge: 2016-08-28 | Disposition: A | Discharge status: Home / Self Care | Payer: Medicare HMO | Attending: Internal Medicine | Admitting: Internal Medicine

## 2016-08-23 ENCOUNTER — Other Ambulatory Visit: Payer: Self-pay | Admitting: Registered Nurse

## 2016-08-23 ENCOUNTER — Encounter (HOSPITAL_COMMUNITY): Payer: Self-pay | Admitting: Emergency Medicine

## 2016-08-23 DIAGNOSIS — I4891 Unspecified atrial fibrillation: Secondary | ICD-10-CM | POA: Diagnosis not present

## 2016-08-23 DIAGNOSIS — E039 Hypothyroidism, unspecified: Secondary | ICD-10-CM | POA: Diagnosis present

## 2016-08-23 DIAGNOSIS — F329 Major depressive disorder, single episode, unspecified: Secondary | ICD-10-CM | POA: Diagnosis not present

## 2016-08-23 DIAGNOSIS — Z6841 Body Mass Index (BMI) 40.0 and over, adult: Secondary | ICD-10-CM | POA: Insufficient documentation

## 2016-08-23 DIAGNOSIS — K648 Other hemorrhoids: Secondary | ICD-10-CM | POA: Diagnosis present

## 2016-08-23 DIAGNOSIS — N400 Enlarged prostate without lower urinary tract symptoms: Secondary | ICD-10-CM | POA: Diagnosis not present

## 2016-08-23 DIAGNOSIS — G2581 Restless legs syndrome: Secondary | ICD-10-CM | POA: Diagnosis not present

## 2016-08-23 DIAGNOSIS — R531 Weakness: Secondary | ICD-10-CM | POA: Diagnosis not present

## 2016-08-23 DIAGNOSIS — K753 Granulomatous hepatitis, not elsewhere classified: Secondary | ICD-10-CM | POA: Insufficient documentation

## 2016-08-23 DIAGNOSIS — Z7984 Long term (current) use of oral hypoglycemic drugs: Secondary | ICD-10-CM | POA: Insufficient documentation

## 2016-08-23 DIAGNOSIS — R000 Tachycardia, unspecified: Secondary | ICD-10-CM | POA: Diagnosis not present

## 2016-08-23 DIAGNOSIS — I481 Persistent atrial fibrillation: Secondary | ICD-10-CM | POA: Diagnosis not present

## 2016-08-23 DIAGNOSIS — Z7982 Long term (current) use of aspirin: Secondary | ICD-10-CM | POA: Insufficient documentation

## 2016-08-23 DIAGNOSIS — K644 Residual hemorrhoidal skin tags: Secondary | ICD-10-CM | POA: Insufficient documentation

## 2016-08-23 DIAGNOSIS — Z7901 Long term (current) use of anticoagulants: Secondary | ICD-10-CM | POA: Insufficient documentation

## 2016-08-23 DIAGNOSIS — R7989 Other specified abnormal findings of blood chemistry: Secondary | ICD-10-CM | POA: Diagnosis present

## 2016-08-23 DIAGNOSIS — K5900 Constipation, unspecified: Secondary | ICD-10-CM | POA: Diagnosis present

## 2016-08-23 DIAGNOSIS — R791 Abnormal coagulation profile: Secondary | ICD-10-CM | POA: Diagnosis not present

## 2016-08-23 DIAGNOSIS — Z85038 Personal history of other malignant neoplasm of large intestine: Secondary | ICD-10-CM | POA: Insufficient documentation

## 2016-08-23 DIAGNOSIS — R109 Unspecified abdominal pain: Secondary | ICD-10-CM | POA: Diagnosis not present

## 2016-08-23 DIAGNOSIS — Z79899 Other long term (current) drug therapy: Secondary | ICD-10-CM | POA: Diagnosis not present

## 2016-08-23 DIAGNOSIS — G4733 Obstructive sleep apnea (adult) (pediatric): Secondary | ICD-10-CM | POA: Diagnosis not present

## 2016-08-23 DIAGNOSIS — R200 Anesthesia of skin: Secondary | ICD-10-CM | POA: Diagnosis present

## 2016-08-23 DIAGNOSIS — K625 Hemorrhage of anus and rectum: Secondary | ICD-10-CM | POA: Diagnosis present

## 2016-08-23 DIAGNOSIS — F32A Depression, unspecified: Secondary | ICD-10-CM | POA: Diagnosis present

## 2016-08-23 DIAGNOSIS — E1140 Type 2 diabetes mellitus with diabetic neuropathy, unspecified: Secondary | ICD-10-CM | POA: Diagnosis present

## 2016-08-23 DIAGNOSIS — I10 Essential (primary) hypertension: Secondary | ICD-10-CM | POA: Diagnosis not present

## 2016-08-23 DIAGNOSIS — I700 Atherosclerosis of aorta: Secondary | ICD-10-CM | POA: Diagnosis not present

## 2016-08-23 LAB — CBC
HCT: 37 % — ABNORMAL LOW (ref 39.0–52.0)
Hemoglobin: 12.3 g/dL — ABNORMAL LOW (ref 13.0–17.0)
MCH: 30.6 pg (ref 26.0–34.0)
MCHC: 33.2 g/dL (ref 30.0–36.0)
MCV: 92 fL (ref 78.0–100.0)
PLATELETS: 291 10*3/uL (ref 150–400)
RBC: 4.02 MIL/uL — ABNORMAL LOW (ref 4.22–5.81)
RDW: 13.7 % (ref 11.5–15.5)
WBC: 8.9 10*3/uL (ref 4.0–10.5)

## 2016-08-23 LAB — BASIC METABOLIC PANEL
Anion gap: 12 (ref 5–15)
BUN: 11 mg/dL (ref 6–20)
CHLORIDE: 103 mmol/L (ref 101–111)
CO2: 20 mmol/L — AB (ref 22–32)
CREATININE: 0.94 mg/dL (ref 0.61–1.24)
Calcium: 9 mg/dL (ref 8.9–10.3)
GFR calc Af Amer: >60 mL/min (ref >60)
GFR calc non Af Amer: >60 mL/min (ref >60)
Glucose, Bld: 131 mg/dL — ABNORMAL HIGH (ref 65–99)
Potassium: 4.1 mmol/L (ref 3.5–5.1)
SODIUM: 135 mmol/L (ref 135–145)

## 2016-08-23 LAB — URINALYSIS, ROUTINE W REFLEX MICROSCOPIC
Bilirubin Urine: NEGATIVE
GLUCOSE, UA: NEGATIVE mg/dL
HGB URINE DIPSTICK: NEGATIVE
KETONES UR: NEGATIVE mg/dL
Leukocytes, UA: NEGATIVE
Nitrite: NEGATIVE
PROTEIN: NEGATIVE mg/dL
Specific Gravity, Urine: 1.018 (ref 1.005–1.030)
pH: 6 (ref 5.0–8.0)

## 2016-08-23 LAB — TSH: TSH: 4.818 u[IU]/mL — AB (ref 0.350–4.500)

## 2016-08-23 LAB — CBG MONITORING, ED: Glucose-Capillary: 130 mg/dL — ABNORMAL HIGH (ref 65–99)

## 2016-08-23 LAB — D-DIMER, QUANTITATIVE (NOT AT ARMC): D DIMER QUANT: 3.22 ug{FEU}/mL — AB (ref 0.00–0.50)

## 2016-08-23 LAB — I-STAT TROPONIN, ED: TROPONIN I, POC: 0 ng/mL (ref 0.00–0.08)

## 2016-08-23 LAB — TYPE AND SCREEN
ABO/RH(D): A POS
Antibody Screen: NEGATIVE

## 2016-08-23 LAB — MAGNESIUM: MAGNESIUM: 2 mg/dL (ref 1.7–2.4)

## 2016-08-23 LAB — ABO/RH: ABO/RH(D): A POS

## 2016-08-23 LAB — POC OCCULT BLOOD, ED: Fecal Occult Bld: POSITIVE — AB

## 2016-08-23 MED ORDER — IOPAMIDOL (ISOVUE-300) INJECTION 61%
INTRAVENOUS | Status: AC
  Administered 2016-08-23: 100 mL
  Filled 2016-08-23: qty 100

## 2016-08-23 MED ORDER — DILTIAZEM HCL 25 MG/5ML IV SOLN
10.0000 mg | Freq: Once | INTRAVENOUS | Status: AC
  Administered 2016-08-24: 10 mg via INTRAVENOUS
  Filled 2016-08-23: qty 5

## 2016-08-23 NOTE — ED Notes (Signed)
Patient transported to CT 

## 2016-08-23 NOTE — ED Notes (Signed)
Pt reports weakness, decreased urine output, and several spots of bright red blood during defication.

## 2016-08-23 NOTE — ED Triage Notes (Addendum)
Pt states weakness and blood leaking on underwear x 3 days.  C/o pressure to bladder and rectum. Recently tx for uti and recently started on neurontin.  Afib on monitor in triage - pt denies hx of.

## 2016-08-23 NOTE — ED Notes (Signed)
Patient returned from CT

## 2016-08-23 NOTE — ED Notes (Signed)
Pt stated he had "messed up his IV" when he went to the bathroom, upon examination the catheter was intact but completely out of the skin site. Catheter was removed from skin and RN was notified.

## 2016-08-23 NOTE — ED Notes (Signed)
Bladder scan 125ml

## 2016-08-23 NOTE — ED Provider Notes (Signed)
[place] DEPT Provider Note   CSN: [id number] Arrival date & time: 08/23/16  1149     History   Chief Complaint Chief Complaint  Patient presents with  . Rectal Bleeding  . Weakness    HPI Brian Richmond is a 61 y.o. male.  HPI Patient, with a past medical history of obesity, diabetes, colon cancer, hypertension presents to ED for evaluation of weakness, decreased urine output, bright red blood in stool for the past 3 weeks.  Concerning his weakness, he states that he has been eating and drinking less and feeling generalized fatigue. He is unsure if this is related to the recent gabapentin that he has been on for his neuropathy. Regarding his decreased urine output, he states that he has the urge to urinate however when he tries to, he has a very weak stream. He states history of similar symptoms in the past for which she is seen a urologist. He states that usually his urologist uses a scope to open up the urethral opening. However this has not happened in the past 9 months. Regarding his blood in stool, he reports similar symptoms in the past which has resolved on their own. However he states that the blood continues and he has noticed blood in his underwear as well. He has a history of colon cancer and is status post partial colectomy 8 years ago. He reports history of constipation due to his narcotic use. Patient denies any chest pain, hemoptysis, shortness of breath, prior MI, DVT, PE, history of A. fib, numbness, weakness, recent falls, recent injuries, headache, vision changes.  Past Medical History:  Diagnosis Date  . Carpal tunnel syndrome    bilateral, and neuropathy bilateral feet  . Colon cancer ([place]) 03/2008   surgery only  . Diabetes mellitus without complication ([place])   . Hx of seasonal allergies   . Hypertension   . Neuropathy   . Neuropathy   . Obesity    BMI 68.0 kg  . OSA (obstructive sleep apnea)    uses cpap nightly  . Reduced mobility    06-27-13  states can transfer self.limited on distances-uses walker and wheelchair mostly.    Patient Active Problem List   Diagnosis Date Noted  . Chronic pain of both knees 06/26/2016  . Neck pain 06/26/2016  . Arthralgia of right acromioclavicular joint 10/15/2013  . Diabetes mellitus with diabetic neuropathy ([place]) 02/19/2013  . Depression 05/22/2011  . Morbid obesity ([place]) 05/22/2011  . Lumbar spondylosis 03/22/2011  . Osteoarthritis of left knee 03/22/2011  . Osteoarthritis of right knee 03/22/2011  . OSA (obstructive sleep apnea) 12/06/2010    Past Surgical History:  Procedure Laterality Date  . COLON SURGERY  03/2008  . COLONOSCOPY WITH PROPOFOL N/A 07/08/2013   Procedure: COLONOSCOPY WITH PROPOFOL;  Surgeon: [name], MD;  Location: [facility];  Service: Endoscopy;  Laterality: N/A;  . COLONOSCOPY WITH PROPOFOL N/A 10/13/2014   Procedure: COLONOSCOPY WITH PROPOFOL;  Surgeon: [name], MD;  Location: [facility];  Service: Endoscopy;  Laterality: N/A;  . FOOT SURGERY     ingrown toenails        Home Medications    Prior to Admission medications   Medication Sig Start Date End Date Taking? Authorizing Provider  aspirin 81 MG chewable tablet Chew 81 mg by mouth daily.    [provider]  cyclobenzaprine (FLEXERIL) 10 MG tablet Take 1 tablet (10 mg total) by mouth 3 (three) times daily as needed for muscle spasms.  06/26/16   Leandrew Koyanagi, MD  DULoxetine (CYMBALTA) 30 MG capsule TAKE 1 CAPSULE EVERY DAY 07/11/16   Meredith Staggers, MD  gabapentin (NEURONTIN) 100 MG capsule TAKE 1 CAPSULE(100 MG) BY MOUTH THREE TIMES DAILY 08/23/16   Bayard Hugger, NP  levothyroxine (SYNTHROID, LEVOTHROID) 25 MCG tablet Take 0.25 mcg by mouth daily. 07/30/14   [provider]  lisinopril (PRINIVIL,ZESTRIL) 10 MG tablet Take 10 mg by mouth every morning.  06/11/12   [provider]  metFORMIN (GLUCOPHAGE-XR) 500 MG 24 hr tablet Take 500 mg by mouth 2 (two)  times daily.  04/14/12   [provider]  morphine (MS CONTIN) 30 MG 12 hr tablet Take 1 tablet (30 mg total) by mouth 3 (three) times daily. 07/03/16   Bayard Hugger, NP  nystatin (MYCOSTATIN) 100000 UNIT/ML suspension SWISH AND SWALLOW 5ML PO BID UTD 09/02/15   [provider]  oxybutynin (DITROPAN) 5 MG tablet Take 5 mg by mouth 3 (three) times daily.  01/31/13   [provider]  oxyCODONE-acetaminophen (PERCOCET) 7.5-325 MG tablet Take 1 tablet by mouth every 6 (six) hours as needed. 07/03/16   Bayard Hugger, NP  polyethylene glycol (MIRALAX / GLYCOLAX) packet Take 17 g by mouth daily.      [provider]  pseudoephedrine (SUDAFED) 30 MG tablet Take 60 mg by mouth every 6 (six) hours as needed for congestion.    [provider]  Tamsulosin HCl (FLOMAX) 0.4 MG CAPS Take 0.4 mg by mouth Daily.  11/25/11   [provider]  testosterone cypionate (DEPOTESTOSTERONE CYPIONATE) 200 MG/ML injection Inject 200 mg into the muscle every 14 (fourteen) days.    [provider]    Family History Family History  Problem Relation Age of Onset  . Ovarian cancer Mother   . Cancer Mother   . Heart disease Mother   . Arthritis Father     Social History Social History  Substance Use Topics  . Smoking status: Never Smoker  . Smokeless tobacco: Never Used  . Alcohol use No     Allergies   Patient has no known allergies.   Review of Systems Review of Systems  Constitutional: Positive for fatigue. Negative for appetite change, chills and fever.  HENT: Negative for ear pain, rhinorrhea, sneezing and sore throat.   Eyes: Negative for photophobia and visual disturbance.  Respiratory: Negative for cough, chest tightness, shortness of breath and wheezing.   Cardiovascular: Negative for chest pain and palpitations.  Gastrointestinal: Positive for blood in stool. Negative for abdominal pain, constipation, diarrhea, nausea and vomiting.    Genitourinary: Positive for decreased urine volume, difficulty urinating and urgency. Negative for dysuria, frequency, hematuria and testicular pain.  Musculoskeletal: Negative for myalgias.  Skin: Negative for rash.  Neurological: Negative for dizziness, weakness and light-headedness.     Physical Exam Updated Vital Signs BP (!) 174/88   Pulse (!) 101   Temp 98.2 F (36.8 C) (Oral)   Resp (!) 24   Ht 5\' 9"  (1.753 m)   Wt (!) 213.6 kg (471 lb)   SpO2 98%   BMI 69.55 kg/m   Physical Exam  Constitutional: He appears well-developed and well-nourished. No distress.  Obese appearing male not in acute distress. Nontoxic appearing.  HENT:  Head: Normocephalic and atraumatic.  Nose: Nose normal.  Eyes: Conjunctivae and EOM are normal. Right eye exhibits no discharge. Left eye exhibits no discharge. No scleral icterus.  Neck: Normal range of motion. Neck  supple.  Cardiovascular: Normal heart sounds and intact distal pulses.  An irregularly irregular rhythm present. Tachycardia present.  Exam reveals no gallop and no friction rub.   No murmur heard. Pulmonary/Chest: Effort normal and breath sounds normal. No respiratory distress.  Abdominal: Soft. Bowel sounds are normal. He exhibits no distension. There is no tenderness. There is no guarding.  Genitourinary: Rectal exam shows guaiac positive stool. Rectal exam shows no external hemorrhoid and no internal hemorrhoid.  Genitourinary Comments: No hemorrhoids noted on rectal exam. Hemoccult positive stool with blood noted in undergarments. Chaperone present throughout entirety of rectal exam.   Musculoskeletal: Normal range of motion. He exhibits no edema.  Neurological: He is alert. He exhibits normal muscle tone. Coordination normal.  Skin: Skin is warm and dry. No rash noted.  Psychiatric: He has a normal mood and affect.  Nursing note and vitals reviewed.    ED Treatments / Results  Labs (all labs ordered are listed, but only  abnormal results are displayed) Labs Reviewed  BASIC METABOLIC PANEL - Abnormal; Notable for the following:       Result Value   CO2 20 (*)    Glucose, Bld 131 (*)    All other components within normal limits  CBC - Abnormal; Notable for the following:    RBC 4.02 (*)    Hemoglobin 12.3 (*)    HCT 37.0 (*)    All other components within normal limits  CBG MONITORING, ED - Abnormal; Notable for the following:    Glucose-Capillary 130 (*)    All other components within normal limits  URINALYSIS, ROUTINE W REFLEX MICROSCOPIC  D-DIMER, QUANTITATIVE (NOT AT [facility])  POC OCCULT BLOOD, ED  I-STAT TROPONIN, ED  TYPE AND SCREEN  ABO/RH    EKG  EKG Interpretation None       Radiology No results found.  Procedures Procedures (including critical care time) CRITICAL CARE Performed by: Delia Heady   Total critical care time: 36 minutes  Critical care time was exclusive of separately billable procedures and treating other patients.  Critical care was necessary to treat or prevent imminent or life-threatening deterioration.  Critical care was time spent personally by me on the following activities: development of treatment plan with patient and/or surrogate as well as nursing, discussions with consultants, evaluation of patient's response to treatment, examination of patient, obtaining history from patient or surrogate, ordering and performing treatments and interventions, ordering and review of laboratory studies, ordering and review of radiographic studies, pulse oximetry and re-evaluation of patient's condition.   Medications Ordered in ED Medications - No data to display   Initial Impression / Assessment and Plan / ED Course  I have reviewed the triage vital signs and the nursing notes.  Pertinent labs & imaging results that were available during my care of the patient were reviewed by me and considered in my medical decision making (see chart for details).     Patient  presents to ED for evaluation of rectal bleeding, difficulty with urination as well as generalized weakness. States that symptoms have all been going on for the past 3 weeks. He is recently started on gabapentin for neuropathy. He denies any previous history of A. fib. On physical exam there is no tenderness to palpation in the abdomen. There are no focal deficits on neurological exam. Lungs are clear to auscultation bilaterally. Patient is in A. fib on cardiac monitor. Heart rate elevated to 80-100. Elevated to 140s with movement. He remains in A. fib throughout  exam. He denies any chest pain, shortness of breath. He denies any history of prior MI, stroke, DVT or PE. BMP unremarkable. CBC showed normal hemoglobin and hematocrit. Urinalysis showed no evidence of UTI or dehydration. Troponin negative 1. Magnesium normal. TSH slightly elevated at 4.8. D-dimer elevated. Chest x-ray returned as negative. CT of the abdomen showed findings consistent with constipation. Rectal exam with no evidence of hemorrhoids but frank blood noted in undergarments. Stool hemoccult positive.  Patient declines CTA at this time due to claustrophobia.  We will admit for new onset A. fib with RVR as well as GI bleed. CHADS-VASC at least 2. Will start with diltiazem 10mg  bolus. CTA still pending. Will consult hospitalist for further management of A.fib with RVR and acute GI bleed. Appreciate their help with this patient.   Final Clinical Impressions(s) / ED Diagnoses   Final diagnoses:  None    New Prescriptions New Prescriptions   No medications on file        Delia Heady, PA-C 08/24/16 Leandrew Koyanagi    Duffy Bruce, MD 08/25/16 1754

## 2016-08-24 ENCOUNTER — Observation Stay (HOSPITAL_BASED_OUTPATIENT_CLINIC_OR_DEPARTMENT_OTHER)
Admit: 2016-08-24 | Discharge: 2016-08-24 | Disposition: A | Discharge status: Home / Self Care | Payer: Medicare HMO | Attending: Internal Medicine | Admitting: Internal Medicine

## 2016-08-24 ENCOUNTER — Observation Stay (HOSPITAL_COMMUNITY): Payer: Medicare HMO

## 2016-08-24 DIAGNOSIS — K625 Hemorrhage of anus and rectum: Secondary | ICD-10-CM | POA: Diagnosis present

## 2016-08-24 DIAGNOSIS — R531 Weakness: Secondary | ICD-10-CM

## 2016-08-24 DIAGNOSIS — Z9989 Dependence on other enabling machines and devices: Secondary | ICD-10-CM | POA: Diagnosis not present

## 2016-08-24 DIAGNOSIS — E039 Hypothyroidism, unspecified: Secondary | ICD-10-CM

## 2016-08-24 DIAGNOSIS — F3289 Other specified depressive episodes: Secondary | ICD-10-CM

## 2016-08-24 DIAGNOSIS — N400 Enlarged prostate without lower urinary tract symptoms: Secondary | ICD-10-CM

## 2016-08-24 DIAGNOSIS — R200 Anesthesia of skin: Secondary | ICD-10-CM

## 2016-08-24 DIAGNOSIS — I10 Essential (primary) hypertension: Secondary | ICD-10-CM | POA: Diagnosis present

## 2016-08-24 DIAGNOSIS — R7989 Other specified abnormal findings of blood chemistry: Secondary | ICD-10-CM

## 2016-08-24 DIAGNOSIS — I4891 Unspecified atrial fibrillation: Secondary | ICD-10-CM | POA: Diagnosis present

## 2016-08-24 DIAGNOSIS — K5900 Constipation, unspecified: Secondary | ICD-10-CM | POA: Diagnosis not present

## 2016-08-24 DIAGNOSIS — G4733 Obstructive sleep apnea (adult) (pediatric): Secondary | ICD-10-CM | POA: Diagnosis not present

## 2016-08-24 LAB — BASIC METABOLIC PANEL
Anion gap: 8 (ref 5–15)
BUN: 10 mg/dL (ref 6–20)
CALCIUM: 9.2 mg/dL (ref 8.9–10.3)
CO2: 26 mmol/L (ref 22–32)
Chloride: 103 mmol/L (ref 101–111)
Creatinine, Ser: 0.94 mg/dL (ref 0.61–1.24)
GFR calc Af Amer: >60 mL/min (ref >60)
GLUCOSE: 112 mg/dL — AB (ref 65–99)
Potassium: 4.1 mmol/L (ref 3.5–5.1)
SODIUM: 137 mmol/L (ref 135–145)

## 2016-08-24 LAB — CBC
HCT: 39.4 % (ref 39.0–52.0)
HCT: 35.9 % — ABNORMAL LOW (ref 39.0–52.0)
HCT: 36.7 % — ABNORMAL LOW (ref 39.0–52.0)
HEMOGLOBIN: 12.3 g/dL — AB (ref 13.0–17.0)
Hemoglobin: 13.3 g/dL (ref 13.0–17.0)
Hemoglobin: 11.9 g/dL — ABNORMAL LOW (ref 13.0–17.0)
MCH: 30.8 pg (ref 26.0–34.0)
MCH: 30.7 pg (ref 26.0–34.0)
MCH: 30.9 pg (ref 26.0–34.0)
MCHC: 33.8 g/dL (ref 30.0–36.0)
MCHC: 33.1 g/dL (ref 30.0–36.0)
MCHC: 33.5 g/dL (ref 30.0–36.0)
MCV: 91.2 fL (ref 78.0–100.0)
MCV: 92.8 fL (ref 78.0–100.0)
MCV: 92.2 fL (ref 78.0–100.0)
PLATELETS: 290 10*3/uL (ref 150–400)
PLATELETS: 292 10*3/uL (ref 150–400)
PLATELETS: 236 10*3/uL (ref 150–400)
RBC: 4.32 MIL/uL (ref 4.22–5.81)
RBC: 3.87 MIL/uL — ABNORMAL LOW (ref 4.22–5.81)
RBC: 3.98 MIL/uL — AB (ref 4.22–5.81)
RDW: 13.5 % (ref 11.5–15.5)
RDW: 14.1 % (ref 11.5–15.5)
RDW: 13.6 % (ref 11.5–15.5)
WBC: 9.4 10*3/uL (ref 4.0–10.5)
WBC: 8.7 10*3/uL (ref 4.0–10.5)
WBC: 8.6 10*3/uL (ref 4.0–10.5)

## 2016-08-24 LAB — RAPID URINE DRUG SCREEN, HOSP PERFORMED
AMPHETAMINES: NOT DETECTED
BARBITURATES: NOT DETECTED
BENZODIAZEPINES: NOT DETECTED
Cocaine: NOT DETECTED
Opiates: POSITIVE — AB
Tetrahydrocannabinol: NOT DETECTED

## 2016-08-24 LAB — APTT: aPTT: 25 seconds (ref 24–36)

## 2016-08-24 LAB — LIPID PANEL
CHOL/HDL RATIO: 5.4 ratio
CHOLESTEROL: 174 mg/dL (ref 0–200)
HDL: 32 mg/dL — ABNORMAL LOW (ref >40)
LDL Cholesterol: 120 mg/dL — ABNORMAL HIGH (ref 0–99)
Triglycerides: 110 mg/dL (ref <150)
VLDL: 22 mg/dL (ref 0–40)

## 2016-08-24 LAB — GLUCOSE, CAPILLARY
GLUCOSE-CAPILLARY: 220 mg/dL — AB (ref 65–99)
Glucose-Capillary: 129 mg/dL — ABNORMAL HIGH (ref 65–99)

## 2016-08-24 LAB — TROPONIN I

## 2016-08-24 LAB — HEMOGLOBIN A1C
HEMOGLOBIN A1C: 5.8 % — AB (ref 4.8–5.6)
MEAN PLASMA GLUCOSE: 119.76 mg/dL

## 2016-08-24 LAB — HIV ANTIBODY (ROUTINE TESTING W REFLEX): HIV SCREEN 4TH GENERATION: NONREACTIVE

## 2016-08-24 LAB — CBG MONITORING, ED
GLUCOSE-CAPILLARY: 124 mg/dL — AB (ref 65–99)
Glucose-Capillary: 104 mg/dL — ABNORMAL HIGH (ref 65–99)

## 2016-08-24 LAB — PROTIME-INR
INR: 1.13
PROTHROMBIN TIME: 14.5 s (ref 11.4–15.2)

## 2016-08-24 LAB — T4, FREE: FREE T4: 0.78 ng/dL (ref 0.61–1.12)

## 2016-08-24 LAB — MRSA PCR SCREENING: MRSA BY PCR: NEGATIVE

## 2016-08-24 LAB — BRAIN NATRIURETIC PEPTIDE: B NATRIURETIC PEPTIDE 5: 129.9 pg/mL — AB (ref 0.0–100.0)

## 2016-08-24 MED ORDER — ZOLPIDEM TARTRATE 5 MG PO TABS: 5.0000 mg | ORAL_TABLET | Freq: Every evening | ORAL | Status: DC | PRN

## 2016-08-24 MED ORDER — METOPROLOL TARTRATE 25 MG PO TABS
25.0000 mg | ORAL_TABLET | Freq: Two times a day (BID) | ORAL | Status: DC
  Administered 2016-08-24 – 2016-08-26 (×6): 25 mg via ORAL
  Filled 2016-08-24 (×7): qty 1

## 2016-08-24 MED ORDER — POLYETHYLENE GLYCOL 3350 17 G PO PACK
17.0000 g | PACK | Freq: Two times a day (BID) | ORAL | Status: DC
  Administered 2016-08-24 – 2016-08-28 (×6): 17 g via ORAL
  Filled 2016-08-24 (×7): qty 1

## 2016-08-24 MED ORDER — LISINOPRIL 10 MG PO TABS
10.0000 mg | ORAL_TABLET | Freq: Every morning | ORAL | Status: DC
  Administered 2016-08-24 – 2016-08-28 (×5): 10 mg via ORAL
  Filled 2016-08-24 (×7): qty 1

## 2016-08-24 MED ORDER — DILTIAZEM HCL 100 MG IV SOLR
5.0000 mg/h | INTRAVENOUS | Status: DC
  Administered 2016-08-24: 5 mg/h via INTRAVENOUS
  Filled 2016-08-24: qty 100

## 2016-08-24 MED ORDER — ONDANSETRON HCL 4 MG/2ML IJ SOLN: 4.0000 mg | Freq: Four times a day (QID) | INTRAMUSCULAR | Status: DC | PRN

## 2016-08-24 MED ORDER — PRAMIPEXOLE DIHYDROCHLORIDE 0.125 MG PO TABS
0.1250 mg | ORAL_TABLET | Freq: Every day | ORAL | Status: DC
  Administered 2016-08-24 – 2016-08-27 (×4): 0.125 mg via ORAL
  Filled 2016-08-24 (×5): qty 1

## 2016-08-24 MED ORDER — LEVOTHYROXINE SODIUM 25 MCG PO TABS
25.0000 ug | ORAL_TABLET | Freq: Every day | ORAL | Status: DC
  Administered 2016-08-24: 25 ug via ORAL
  Filled 2016-08-24 (×2): qty 1

## 2016-08-24 MED ORDER — PANTOPRAZOLE SODIUM 40 MG IV SOLR
40.0000 mg | Freq: Two times a day (BID) | INTRAVENOUS | Status: DC
  Administered 2016-08-24 – 2016-08-28 (×8): 40 mg via INTRAVENOUS
  Filled 2016-08-24 (×9): qty 40

## 2016-08-24 MED ORDER — INSULIN ASPART 100 UNIT/ML ~~LOC~~ SOLN: 0.0000 [IU] | Freq: Every day | SUBCUTANEOUS | Status: DC

## 2016-08-24 MED ORDER — LEVOTHYROXINE SODIUM 50 MCG PO TABS
50.0000 ug | ORAL_TABLET | Freq: Every day | ORAL | Status: DC
  Administered 2016-08-25 – 2016-08-28 (×4): 50 ug via ORAL
  Filled 2016-08-24 (×4): qty 1

## 2016-08-24 MED ORDER — DULOXETINE HCL 30 MG PO CPEP
30.0000 mg | ORAL_CAPSULE | Freq: Every day | ORAL | Status: DC
  Administered 2016-08-24 – 2016-08-28 (×5): 30 mg via ORAL
  Filled 2016-08-24 (×5): qty 1

## 2016-08-24 MED ORDER — INSULIN ASPART 100 UNIT/ML ~~LOC~~ SOLN
0.0000 [IU] | Freq: Three times a day (TID) | SUBCUTANEOUS | Status: DC
  Administered 2016-08-24: 3 [IU] via SUBCUTANEOUS
  Administered 2016-08-24: 1 [IU] via SUBCUTANEOUS
  Administered 2016-08-25: 5 [IU] via SUBCUTANEOUS
  Administered 2016-08-26 – 2016-08-28 (×4): 1 [IU] via SUBCUTANEOUS
  Filled 2016-08-24: qty 1

## 2016-08-24 MED ORDER — OXYCODONE-ACETAMINOPHEN 7.5-325 MG PO TABS
1.0000 | ORAL_TABLET | Freq: Four times a day (QID) | ORAL | Status: DC | PRN
  Administered 2016-08-25 – 2016-08-27 (×3): 1 via ORAL
  Filled 2016-08-24 (×3): qty 1

## 2016-08-24 MED ORDER — TAMSULOSIN HCL 0.4 MG PO CAPS
0.4000 mg | ORAL_CAPSULE | Freq: Every day | ORAL | Status: DC
  Administered 2016-08-24 – 2016-08-28 (×5): 0.4 mg via ORAL
  Filled 2016-08-24 (×5): qty 1

## 2016-08-24 MED ORDER — CYCLOBENZAPRINE HCL 10 MG PO TABS: 10.0000 mg | ORAL_TABLET | Freq: Three times a day (TID) | ORAL | Status: DC | PRN

## 2016-08-24 MED ORDER — GABAPENTIN 100 MG PO CAPS
100.0000 mg | ORAL_CAPSULE | Freq: Two times a day (BID) | ORAL | Status: DC
  Administered 2016-08-24 – 2016-08-28 (×9): 100 mg via ORAL
  Filled 2016-08-24 (×9): qty 1

## 2016-08-24 MED ORDER — SENNOSIDES-DOCUSATE SODIUM 8.6-50 MG PO TABS
1.0000 | ORAL_TABLET | Freq: Two times a day (BID) | ORAL | Status: DC
  Administered 2016-08-24 – 2016-08-27 (×7): 1 via ORAL
  Filled 2016-08-24 (×8): qty 1

## 2016-08-24 MED ORDER — HYDRALAZINE HCL 20 MG/ML IJ SOLN: 5.0000 mg | INTRAMUSCULAR | Status: DC | PRN

## 2016-08-24 MED ORDER — POLYETHYLENE GLYCOL 3350 17 G PO PACK
17.0000 g | PACK | Freq: Every day | ORAL | Status: DC
  Administered 2016-08-24: 17 g via ORAL
  Filled 2016-08-24: qty 1

## 2016-08-24 MED ORDER — OXYBUTYNIN CHLORIDE 5 MG PO TABS
5.0000 mg | ORAL_TABLET | Freq: Three times a day (TID) | ORAL | Status: DC
  Administered 2016-08-24 – 2016-08-28 (×12): 5 mg via ORAL
  Filled 2016-08-24 (×14): qty 1

## 2016-08-24 MED ORDER — MORPHINE SULFATE ER 15 MG PO TBCR
30.0000 mg | EXTENDED_RELEASE_TABLET | Freq: Three times a day (TID) | ORAL | Status: DC
  Administered 2016-08-24 – 2016-08-28 (×13): 30 mg via ORAL
  Filled 2016-08-24 (×14): qty 2

## 2016-08-24 MED ORDER — IOPAMIDOL (ISOVUE-370) INJECTION 76%
INTRAVENOUS | Status: AC
  Filled 2016-08-24: qty 100

## 2016-08-24 MED ORDER — PSEUDOEPHEDRINE HCL 30 MG PO TABS
30.0000 mg | ORAL_TABLET | Freq: Four times a day (QID) | ORAL | Status: DC | PRN
  Filled 2016-08-24: qty 1

## 2016-08-24 MED ORDER — ACETAMINOPHEN 325 MG PO TABS: 650.0000 mg | ORAL_TABLET | ORAL | Status: DC | PRN

## 2016-08-24 NOTE — ED Notes (Signed)
Checked patient cbg it was 104 notified RN of blood sugar 

## 2016-08-24 NOTE — ED Notes (Signed)
Patient returned to room via transport.

## 2016-08-24 NOTE — Consult Note (Signed)
Cardiology Consultation:   Patient ID: Brian Richmond; [id number]; [birth year]   Admit date: 08/23/2016 Date of Consult: 08/24/2016  Primary Care Provider: Hulan Fess, MD Primary Cardiologist: new  - Dr. Sallyanne Kuster Primary Electrophysiologist:     Patient Profile:   Brian Richmond is a 61 y.o. male with a hx of NTN, DM, hypothyroidism, depression, OSA on CPAP, obesity, colon cancer (s/p partial colon resection), and BPH who is being seen today for the evaluation of atrial fibrillation at the request of Dr. Blaine Hamper.  History of Present Illness:   Brian Richmond presented to the [facility] with generalized weakness, right upper leg numbness, and rectal bleeding. He states that he has been unable to pick up his portable plastic mug with one arm over the last week. He has a history of left shoulder pain and is unable to walk due to bilateral knee arthritis. He uses an Clinical research associate and walker. However, he states he has been bed bound for about 4 months due to his shoulder pain and need to support himself with a walker. He also states that he ran out of miralax a few weeks ago and has been suffering from constipation which has caused rectal bleeding.  He denies chest pain, shortness of breath, palpitations, dizziness, and feelings of syncope. He denies recent illness and does not currently see cardiology.  He drove himself to the ED and on arrival was found to be in Afib RVR with rates in the 140-150s. Cardizem drip was started and PO lopressor given. He is now rate controlled and has had episodes of bradycardia. HR is now in the 60s on 5 mg/hr IV dilt. Of note, D-dimer was positive.   Past Medical History:  Diagnosis Date  . Carpal tunnel syndrome    bilateral, and neuropathy bilateral feet  . Colon cancer ([place]) 03/2008   surgery only  . Diabetes mellitus without complication ([place])   . Hx of seasonal allergies   . Hypertension   . Neuropathy   . Neuropathy   . Obesity    BMI 68.0 kg  . OSA  (obstructive sleep apnea)    uses cpap nightly  . Reduced mobility    06-27-13 states can transfer self.limited on distances-uses walker and wheelchair mostly.    Past Surgical History:  Procedure Laterality Date  . COLON SURGERY  03/2008  . COLONOSCOPY WITH PROPOFOL N/A 07/08/2013   Procedure: COLONOSCOPY WITH PROPOFOL;  Surgeon: [name], MD;  Location: [facility];  Service: Endoscopy;  Laterality: N/A;  . COLONOSCOPY WITH PROPOFOL N/A 10/13/2014   Procedure: COLONOSCOPY WITH PROPOFOL;  Surgeon: [name], MD;  Location: [facility];  Service: Endoscopy;  Laterality: N/A;  . FOOT SURGERY     ingrown toenails      Inpatient Medications: Scheduled Meds: . DULoxetine  30 mg Oral Daily  . gabapentin  100 mg Oral BID  . insulin aspart  0-5 Units Subcutaneous QHS  . insulin aspart  0-9 Units Subcutaneous TID WC  . levothyroxine  25 mcg Oral QAC breakfast  . lisinopril  10 mg Oral q morning - 10a  . metoprolol tartrate  25 mg Oral BID  . morphine  30 mg Oral Q8H  . oxybutynin  5 mg Oral TID  . pantoprazole (PROTONIX) IV  40 mg Intravenous Q12H  . polyethylene glycol  17 g Oral Daily  . pramipexole  0.125 mg Oral QHS  . senna-docusate  1 tablet Oral BID  . tamsulosin  0.4 mg  Oral Daily   Continuous Infusions: . diltiazem (CARDIZEM) infusion 5 mg/hr (08/24/16 0343)   PRN Meds: acetaminophen, cyclobenzaprine, hydrALAZINE, ondansetron (ZOFRAN) IV, oxyCODONE-acetaminophen, pseudoephedrine, zolpidem  Allergies:   No Known Allergies  Social History:   Social History   Social History  . Marital status: Married    Spouse name: N/A  . Number of children: y  . Years of education: N/A   Occupational History  . DISABLED Disabled   Social History Main Topics  . Smoking status: Never Smoker  . Smokeless tobacco: Never Used  . Alcohol use No  . Drug use: No  . Sexual activity: Yes   Other Topics Concern  . Not on file   Social History Narrative   Pt is  married and lives with wife.      Family History:    Family History  Problem Relation Age of Onset  . Ovarian cancer Mother   . Cancer Mother   . Heart disease Mother   . Arthritis Father      ROS:  Please see the history of present illness.  ROS  All other ROS reviewed and negative.     Physical Exam/Data:   Vitals:   08/24/16 0930 08/24/16 1000 08/24/16 1030 08/24/16 1100  BP: 103/62 118/68 111/75 110/85  Pulse: 69 68 60 82  Resp: 13 13 13 18   Temp:      TempSrc:      SpO2: 97% 97% 99% 100%  Weight:      Height:        Intake/Output Summary (Last 24 hours) at 08/24/16 1225 Last data filed at 08/24/16 0849  Gross per 24 hour  Intake                0 ml  Output              550 ml  Net             -550 ml   Filed Weights   08/23/16 1227  Weight: (!) 471 lb (213.6 kg)   Body mass index is 69.55 kg/m.  General:  Morbidly obese, in no acute distress HEENT: normal Lymph: no adenopathy Neck: exam difficult Endocrine:  No thryomegaly Vascular: No carotid bruits; FA pulses 2+ bilaterally without bruits  Cardiac:  Irregular rhythm, heart sounds difficult to assess given body habitus  Lungs:  clear breath sounds bilaterally, diminished in bases Abd: soft, nontender, no hepatomegaly  Ext: no edema Musculoskeletal:  No deformities, BUE and BLE strength normal and equal Skin: warm and dry  Neuro:  CNs 2-12 intact, no focal abnormalities noted Psych:  Normal affect   EKG:  The EKG was personally reviewed and demonstrates:  Afib with RVR Telemetry:  Telemetry was personally reviewed and demonstrates:  Rate-controlled AFib  Relevant CV Studies:  Echocardiogram pending  Laboratory Data:  Chemistry Recent Labs Lab 08/23/16 1240 08/24/16 1120  NA 135 137  K 4.1 4.1  CL 103 103  CO2 20* 26  GLUCOSE 131* 112*  BUN 11 10  CREATININE 0.94 0.94  CALCIUM 9.0 9.2  GFRNONAA >60 >60  GFRAA >60 >60  ANIONGAP 12 8    No results for input(s): PROT, ALBUMIN, AST,  ALT, ALKPHOS, BILITOT in the last 168 hours. Hematology Recent Labs Lab 08/23/16 1240 08/24/16 0317 08/24/16 1120  WBC 8.9 9.4 8.7  RBC 4.02* 4.32 3.87*  HGB 12.3* 13.3 11.9*  HCT 37.0* 39.4 35.9*  MCV 92.0 91.2 92.8  MCH 30.6 30.8 30.7  MCHC 33.2 33.8 33.1  RDW 13.7 13.5 14.1  PLT 291 290 292   Cardiac Enzymes Recent Labs Lab 08/24/16 0317 08/24/16 1120  TROPONINI <0.03 <0.03    Recent Labs Lab 08/23/16 2118  TROPIPOC 0.00    BNP Recent Labs Lab 08/24/16 0317  BNP 129.9*    DDimer  Recent Labs Lab 08/23/16 2222  DDIMER 3.22*    Radiology/Studies:  Dg Chest 2 View  Result Date: 08/23/2016 CLINICAL DATA:  Atrial fibrillation, weakness and rectal bleeding. EXAM: CHEST  2 VIEW COMPARISON:  None. FINDINGS: The heart size and mediastinal contours are within normal limits. There is no evidence of pulmonary edema, consolidation, pneumothorax, nodule or pleural fluid. The visualized skeletal structures are unremarkable. IMPRESSION: No active cardiopulmonary disease. Electronically Signed   By: Aletta Edouard M.D.   On: 08/23/2016 22:06   Ct Abdomen Pelvis W Contrast  Result Date: 08/23/2016 CLINICAL DATA:  Abdominal pain query gastroenteritis or colitis. EXAM: CT ABDOMEN AND PELVIS WITH CONTRAST TECHNIQUE: Multidetector CT imaging of the abdomen and pelvis was performed using the standard protocol following bolus administration of intravenous contrast. CONTRAST:  133mL ISOVUE-300 IOPAMIDOL (ISOVUE-300) INJECTION 61% COMPARISON:  None. FINDINGS: Lower chest: Coronary arteriosclerosis. Heart is top-normal in size. No pericardial effusion. There is aortic atherosclerosis. Hepatobiliary: Hepatic steatosis. Calcification in the right hepatic lobe consistent with a granuloma. Mild distention of the gallbladder with probable biliary sludge noted within. No wall thickening or pericholecystic fluid. No biliary dilatation. Pancreas: Mild fatty atrophy without mass or ductal dilatation.  Spleen: Normal without mass. 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 The kidneys, ureters and bladder are nonacute. Stomach/Bowel: Contracted stomach. Normal small bowel rotation without inflammation or distention. No obstruction. Normal appendix. A moderate degree of fecal residue is noted within the cecum and ascending colon as well as distal descending colon through rectum. Vascular/Lymphatic: Aortoiliac atherosclerosis without aneurysm. No adenopathy. Reproductive: Normal size prostate. Other: Ventral midline scarring. Small fat containing umbilical hernia. Musculoskeletal: Thoracolumbar spondylosis. No acute osseous abnormality. IMPRESSION: 1. Moderate amount of fecal residue within large bowel without inflammation. Query constipation. 2. Coronary arteriosclerosis. 3. Hepatic steatosis with right hepatic granuloma. Biliary sludge noted within the gallbladder without evidence of acute cholecystitis. 4. Aortoiliac atherosclerosis. 5. Thoracolumbar spondylosis. Electronically Signed   By: Ashley Royalty M.D.   On: 08/23/2016 21:58    Assessment and Plan:   1. Atrial fibrillation with RVR - primary team started lopressor 25 mg BID - cardizem drip started - due to drops in HR/bradycardia, cardizem drip is now at 5 mg/hr - patient is asymptomatic This patients CHA2DS2-VASc Score and unadjusted Ischemic Stroke Rate (% per year) is equal to 2.2 % stroke rate/year from a score of 2 (HTN, DM) - will hold off on anticoagulation at this time until rectal bleeding has resolved - troponin negative - K 4.1, Mg 2.0, TSH 4.8 (upper end of normal is 4.5; will need to follow up outpatient) - echocardiogram pending - will D/C cardizem drip and titrate PO lopressor   2. Rectal bleeding, constipation - in the setting of chronic constipation - Hb stable, HDS - CT abdomen/pelvis negative for acute processes - will defer to primary/GI on when to  safely start anticoagulation   3. Positive D-dimer - CTA not done given contrast load from CT abdomen/pelvis - If he becomes symptomatic, will obtain VQ scan   4. HTN - home lisinopril - pressures have been normal, he is tolerating IV dilt and PO  lopressor well    Signed, Ledora Bottcher, [place]  08/24/2016 12:25 PM  I have seen and examined the patient along with Ledora Bottcher, PA  .  I have reviewed the chart, notes and new data.  I agree with PA's note.  Key new complaints: had weakness, but not aware of palpitations - unable to pinpoint arrhythmia onset. Denies angina and dyspnea. Key examination changes: super obesity severely limits the exam, but other than irregular rhythm there is no obvious abnormality on CV exam. Rate is now in the 50s. Lying completely supine in no distress Key new findings / data: Hgb normal  PLAN: Newly diagnosed persistent atrial fibrillation in setting of OSA and superobesity. Hold off anticoagulation for the time being, until cause and severity of GI bleed are clarified. Once that is cleared up, he needs lifelong anticoagulation. For the same reason, cannot proceed with cardioversion. He is essentially asymptomatic with rate control. I would advocate 3-4 weeks of anticoagulation and rate control before we even consider cardioversion. If he fails the initial cardioversion or has early AF recurrence, lifelong rate control strategy may be best option. I do not anticipate a great chance of success with antiarrhythmic strategy due to his weight and OSA. (Will get a transthoracic echo, but images may be difficult to evaluate. If we cannot get enough information and we need a TEE anyway, then that might be an incentive to do an earlier cardioversion. Otherwise no real reason to rush).  Sanda Klein, MD, [place] [phone] 08/24/2016, 2:43 PM

## 2016-08-24 NOTE — H&P (Addendum)
History and Physical    Brian Richmond IRC:[id number] DOB: 11-Dec-1955 DOA: 08/23/2016  Referring MD/NP/PA:   PCP: Hulan Fess, MD   Patient coming from:  The patient is coming from home.  At baseline, pt is independent for most of ADL.   Chief Complaint: Rectal bleeding, right leg numbness, generalized weakness   HPI: Brian Richmond is a 62 y.o. male with medical history significant of hypertension, diabetes mellitus, hypothyroidism, depression, OSA, obesity, colon cancer 2010 (s/p of partial colon resection, no radiation or chemotherapy), BPH, using wheelchair and walker, who presents with rectal bleeding, right leg numbness and generalized weakness.  Patient states that he has been having generalized weakness for almost 3 weeks. He also has right upper leg numbness, mainly located in the medial side of upper thigh. He states that he cannot keep right leg still, saying that that he has restless leg syndrome. No unilateral weakness in extremities. No vision change, hearing loss, slurred speech or facial droop. Patient states that he has been constipated recently. He reports that he had bright red blood spot on underwear on Sunday. Patient does not have nausea, vomiting, diarrhea or abdominal pain. He states that he has decreased urine output and weak stream. No dysuria or burning on urination. Patient does not have chest pain, shortness breath, cough, fever, chills. No tenderness in the calf areas.  ED Course: pt was found to have positive d-dimer 3.22, positive FOBT, WBC 8.9, hemoglobin 12.3, negative urinalysis, electrolytes renal function okay, temperature normal, new onset atrial fibrillation with RVR, negative chest x-ray. CT abdomen/pelvis is not impressive, but showed constipation. Patient is placed on telemetry bed for observation.  Review of Systems:   General: no fevers, chills, no body weight gain, has fatigue HEENT: no blurry vision, hearing changes or sore throat Respiratory: no  dyspnea, coughing, wheezing CV: no chest pain, no palpitations GI: no nausea, vomiting, abdominal pain, diarrhea, has constipation and rectal bleeding. GU: no dysuria, burning on urination, increased urinary frequency, hematuria  Ext: no leg edema Neuro: has right leg numbness, no vision change or hearing loss Skin: no rash, no skin tear. MSK: No muscle spasm, no deformity, no limitation of range of movement in spin Heme: No easy bruising.  Travel history: No recent long distant travel.  Allergy: No Known Allergies  Past Medical History:  Diagnosis Date  . Carpal tunnel syndrome    bilateral, and neuropathy bilateral feet  . Colon cancer ([place]) 03/2008   surgery only  . Diabetes mellitus without complication ([place])   . Hx of seasonal allergies   . Hypertension   . Neuropathy   . Neuropathy   . Obesity    BMI 68.0 kg  . OSA (obstructive sleep apnea)    uses cpap nightly  . Reduced mobility    06-27-13 states can transfer self.limited on distances-uses walker and wheelchair mostly.    Past Surgical History:  Procedure Laterality Date  . COLON SURGERY  03/2008  . COLONOSCOPY WITH PROPOFOL N/A 07/08/2013   Procedure: COLONOSCOPY WITH PROPOFOL;  Surgeon: [name], MD;  Location: [facility];  Service: Endoscopy;  Laterality: N/A;  . COLONOSCOPY WITH PROPOFOL N/A 10/13/2014   Procedure: COLONOSCOPY WITH PROPOFOL;  Surgeon: [name], MD;  Location: [facility];  Service: Endoscopy;  Laterality: N/A;  . FOOT SURGERY     ingrown toenails     Social History:  reports that he has never smoked. He has never used smokeless tobacco. He reports that he does  not drink alcohol or use drugs.  Family History:  Family History  Problem Relation Age of Onset  . Ovarian cancer Mother   . Cancer Mother   . Heart disease Mother   . Arthritis Father      Prior to Admission medications   Medication Sig Start Date End Date Taking? Authorizing Provider  cyclobenzaprine  (FLEXERIL) 10 MG tablet Take 1 tablet (10 mg total) by mouth 3 (three) times daily as needed for muscle spasms. 06/26/16  Yes Leandrew Koyanagi, MD  DULoxetine (CYMBALTA) 30 MG capsule TAKE 1 CAPSULE EVERY DAY Patient taking differently: Take 30 mg by mouth at bedtime 07/11/16  Yes Meredith Staggers, MD  gabapentin (NEURONTIN) 100 MG capsule TAKE 1 CAPSULE(100 MG) BY MOUTH THREE TIMES DAILY Patient taking differently: Take 100 mg by mouth two times a day (MORNING and EVENING) 08/23/16  Yes Bayard Hugger, NP  glimepiride (AMARYL) 2 MG tablet Take 3 mg by mouth at bedtime.  08/22/16  Yes [provider]  levothyroxine (SYNTHROID, LEVOTHROID) 25 MCG tablet Take 25 mcg by mouth daily.  07/30/14  Yes [provider]  lisinopril (PRINIVIL,ZESTRIL) 10 MG tablet Take 10 mg by mouth every morning.  06/11/12  Yes [provider]  metFORMIN (GLUCOPHAGE-XR) 500 MG 24 hr tablet Take 500 mg by mouth 2 (two) times daily.  04/14/12  Yes [provider]  morphine (MS CONTIN) 30 MG 12 hr tablet Take 1 tablet (30 mg total) by mouth 3 (three) times daily. 07/03/16  Yes Bayard Hugger, NP  nystatin (MYCOSTATIN) 100000 UNIT/ML suspension SWISH AND SWALLOW 5 ml's once a day as needed for thrush 09/02/15  Yes [provider]  oxybutynin (DITROPAN) 5 MG tablet Take 5 mg by mouth 3 (three) times daily.  01/31/13  Yes [provider]  oxyCODONE-acetaminophen (PERCOCET) 7.5-325 MG tablet Take 1 tablet by mouth every 6 (six) hours as needed. Patient taking differently: Take 1 tablet by mouth 3 (three) times daily.  07/03/16  Yes Bayard Hugger, NP  pseudoephedrine (SUDAFED) 30 MG tablet Take 30 mg by mouth every 6 (six) hours as needed for congestion.    Yes [provider]  Tamsulosin HCl (FLOMAX) 0.4 MG CAPS Take 0.4 mg by mouth daily.  11/25/11  Yes [provider]    Physical Exam: Vitals:   08/24/16 0035 08/24/16 0045 08/24/16 0100 08/24/16 0115  BP: 140/82  139/74 135/73 139/82  Pulse: 83 81 85 89  Resp: 20 18 17 16   Temp:      TempSrc:      SpO2: 99% 98% 97% 97%  Weight:      Height:       General: Not in acute distress HEENT:       Eyes: PERRL, EOMI, no scleral icterus.       ENT: No discharge from the ears and nose, no pharynx injection, no tonsillar enlargement.        Neck: No JVD, no bruit, no mass felt. Heme: No neck lymph node enlargement. Cardiac: S1/S2, Irregularly irregular rhythm, No murmurs, No gallops or rubs. Respiratory: No rales, wheezing, rhonchi or rubs. GI: Soft, nondistended, nontender, no rebound pain, no organomegaly, BS present. GU: No hematuria Ext: No pitting leg edema bilaterally. 2+DP/PT pulse bilaterally. Musculoskeletal: No joint deformities, No joint redness or warmth, no limitation of ROM in spin. Skin: No rashes.  Neuro: Alert, oriented X3, cranial nerves II-XII grossly intact, moves all extremities normally. Muscle strength 5/5 in all  extremities, sensation to light touch intact. Brachial reflex 2+ bilaterally. Negative Babinski's sign. Normal finger to nose test. Psych: Patient is not psychotic, no suicidal or hemocidal ideation.  Labs on Admission: I have personally reviewed following labs and imaging studies  CBC:  Recent Labs Lab 08/23/16 1240  WBC 8.9  HGB 12.3*  HCT 37.0*  MCV 92.0  PLT 161   Basic Metabolic Panel:  Recent Labs Lab 08/23/16 1240 08/23/16 2222  NA 135  --   K 4.1  --   CL 103  --   CO2 20*  --   GLUCOSE 131*  --   BUN 11  --   CREATININE 0.94  --   CALCIUM 9.0  --   MG  --  2.0   GFR: Estimated Creatinine Clearance: 147.4 mL/min (by C-G formula based on SCr of 0.94 mg/dL). Liver Function Tests: No results for input(s): AST, ALT, ALKPHOS, BILITOT, PROT, ALBUMIN in the last 168 hours. No results for input(s): LIPASE, AMYLASE in the last 168 hours. No results for input(s): AMMONIA in the last 168 hours. Coagulation Profile: No results for input(s): INR,  PROTIME in the last 168 hours. Cardiac Enzymes: No results for input(s): CKTOTAL, CKMB, CKMBINDEX, TROPONINI in the last 168 hours. BNP (last 3 results) No results for input(s): PROBNP in the last 8760 hours. HbA1C: No results for input(s): HGBA1C in the last 72 hours. CBG:  Recent Labs Lab 08/23/16 1237  GLUCAP 130*   Lipid Profile: No results for input(s): CHOL, HDL, LDLCALC, TRIG, CHOLHDL, LDLDIRECT in the last 72 hours. Thyroid Function Tests:  Recent Labs  08/23/16 2222  TSH 4.818*   Anemia Panel: No results for input(s): VITAMINB12, FOLATE, FERRITIN, TIBC, IRON, RETICCTPCT in the last 72 hours. Urine analysis:    Component Value Date/Time   COLORURINE YELLOW 08/23/2016 [place] 08/23/2016 1305   LABSPEC 1.018 08/23/2016 1305   PHURINE 6.0 08/23/2016 1305   GLUCOSEU NEGATIVE 08/23/2016 1305   HGBUR NEGATIVE 08/23/2016 1305   [place] 08/23/2016 1305   KETONESUR NEGATIVE 08/23/2016 1305   PROTEINUR NEGATIVE 08/23/2016 1305   NITRITE NEGATIVE 08/23/2016 1305   LEUKOCYTESUR NEGATIVE 08/23/2016 1305   Sepsis Labs: @LABRCNTIP (procalcitonin:4,lacticidven:4) )No results found for this or any previous visit (from the past 240 hour(s)).   Radiological Exams on Admission: Dg Chest 2 View  Result Date: 08/23/2016 CLINICAL DATA:  Atrial fibrillation, weakness and rectal bleeding. EXAM: CHEST  2 VIEW COMPARISON:  None. FINDINGS: The heart size and mediastinal contours are within normal limits. There is no evidence of pulmonary edema, consolidation, pneumothorax, nodule or pleural fluid. The visualized skeletal structures are unremarkable. IMPRESSION: No active cardiopulmonary disease. Electronically Signed   By: Aletta Edouard M.D.   On: 08/23/2016 22:06   Ct Abdomen Pelvis W Contrast  Result Date: 08/23/2016 CLINICAL DATA:  Abdominal pain query gastroenteritis or colitis. EXAM: CT ABDOMEN AND PELVIS WITH CONTRAST TECHNIQUE: Multidetector CT  imaging of the abdomen and pelvis was performed using the standard protocol following bolus administration of intravenous contrast. CONTRAST:  180mL ISOVUE-300 IOPAMIDOL (ISOVUE-300) INJECTION 61% COMPARISON:  None. FINDINGS: Lower chest: Coronary arteriosclerosis. Heart is top-normal in size. No pericardial effusion. There is aortic atherosclerosis. Hepatobiliary: Hepatic steatosis. Calcification in the right hepatic lobe consistent with a granuloma. Mild distention of the gallbladder with probable biliary sludge noted within. No wall thickening or pericholecystic fluid. No biliary dilatation. Pancreas: Mild fatty atrophy without mass or ductal dilatation. Spleen: Normal without mass. 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 The kidneys, ureters and bladder are nonacute. Stomach/Bowel: Contracted stomach. Normal small bowel rotation without inflammation or distention. No obstruction. Normal appendix. A moderate degree of fecal residue is noted within the cecum and ascending colon as well as distal descending colon through rectum. Vascular/Lymphatic: Aortoiliac atherosclerosis without aneurysm. No adenopathy. Reproductive: Normal size prostate. Other: Ventral midline scarring. Small fat containing umbilical hernia. Musculoskeletal: Thoracolumbar spondylosis. No acute osseous abnormality. IMPRESSION: 1. Moderate amount of fecal residue within large bowel without inflammation. Query constipation. 2. Coronary arteriosclerosis. 3. Hepatic steatosis with right hepatic granuloma. Biliary sludge noted within the gallbladder without evidence of acute cholecystitis. 4. Aortoiliac atherosclerosis. 5. Thoracolumbar spondylosis. Electronically Signed   By: Ashley Royalty M.D.   On: 08/23/2016 21:58     EKG: Independently reviewed.  Atrial fibrillation with RVR, QTC 642  Assessment/Plan Principal Problem:   New onset atrial fibrillation (HCC) Active  Problems:   Depression   Diabetes mellitus with diabetic neuropathy (HCC)   HTN (hypertension)   Rectal bleeding   Hypothyroidism   BPH (benign prostatic hyperplasia)   Atrial fibrillation with RVR (HCC)   Constipation   Right leg numbness   Generalized weakness   Positive D dimer   New onset atrial fibrillation with RVR: Patient does not have chest pain. No signs of infection. Unclear about the triggering factors. Patient received one dose of Cardizem 10 mg 1 by IV, heart rate is better controlled, currently at 100 to 140s. CHA2DS2-VASc Score is 2, needs oral anticoagulation, but pt has rectal bleeding now, cannot use AC.   - Place on SDU bed for obs - start metoprolol 25 mg twice a day - start cardizem gtt - cycle CEs. - 2-D echo  - TSH and Free T4 - UDS - RIsk stratify with FLP, UDS and A1c - Inpatient non-urgent consult order was put in Epic and message to [employer] was sent out.  Rectal bleeding: pt has mild rectal bleeding in the setting of constipation. Hemoglobin 12.3. Hemodynamically stable. Patient has history of colon cancer, s/p of partial colectomy. CT abdomen pelvis did not show acute issues. - Start IV pantoprazole 40 mg bid - Avoid NSAIDs and SQ heparin - Maintain IV access (2 large bore IVs if possible). - Monitor closely and follow q6h cbc, transfuse as necessary. - LaB: INR, PTT and type screen - may need to f/u with GI as outpt  Positive D-dimer: D-dimer is 3.22 in ED. Patient does not have any chest pain, shortness breath or signs of DVT. No oxygen desaturation, clinically less likely to have PE. Since patient just received contrast for CT abdomen/pelvis, will not do CTA of chest tonight. -will get LE doppler to r/o DVT -if pt develops any signs of PE, will get V/Q scan.  DM-II with complication of a neuropathy: Last A1c not on record. Patient is taking metformin and Amaryl at home -SSI -Check A1c  Depression: Stable, no suicidal or homicidal  ideations. -Continue home medications: Cymbalta  HTN (hypertension): -Continue lisinopril -newly started metoprolol for atrial fibrillation -IV hydralazine when necessary  Hypothyroidism: Last TSH was 4.418 on 08/23/16 -Continue home Synthroid -Check TSH and T4 as above  BPH (benign prostatic hyperplasia): -Flomax and oxybutynin  Constipation: pt is taking MS Contin, Percocet for pain -MiraLAX and senokote  Right leg numbness and possible RLS: Etiology is not clear. Patient leg numbness is mainly located in the R upper medial thigh, does not seem to have TIA  or stroke. May be related to lumbar spine issue. He may also have restless leg syndrome -Empiric pramipexole 0.125 mg at bedtime daily -MRI of lumbar spine  Generalized weakness: Likely multifactorial etiology, including new onset of atrial fibrillation, rectal bleeding, left leg numbnes. -PT/OT    DVT ppx: none (pt has rectal bleeding, can not use heparin or Lovenox; he also has positive D-dimer, need to r/o PE and DVT before SCD can be applied). Please f/u LE doppler, if negative for DVT-->please start SCD. Code Status: Full code Family Communication: None at bed side. Disposition Plan:  Anticipate discharge back to previous home environment Consults called:  none Admission status: Obs / tele    Date of Service 08/24/2016    Ivor Costa [facility] Pager [phone]  If 7PM-7AM, please contact night-coverage [email or web address] Password [email or web address] 08/24/2016, 2:26 AM

## 2016-08-24 NOTE — ED Notes (Addendum)
Pt is refusing to have the scan done, states he is claustrophobic.

## 2016-08-24 NOTE — ED Notes (Signed)
MD at bedside at this time.

## 2016-08-24 NOTE — Care Management Note (Signed)
Case Management Note  Patient Details  Name: Brian Richmond MRN: [id number] Date of Birth: 09/17/1955  Subjective/Objective:                  61 y.o. male form home with spouse; has PMHx of hypertension, diabetes mellitus, hypothyroidism, depression, OSA, obesity, colon cancer 2010 (s/p of partial colon resection, no radiation or chemotherapy), BPH, using wheelchair and walker, who presents with rectal bleeding, right leg numbness and generalized weakness.   Action/Plan: Admit status Observation (Rectal bleeding, right leg numbness, generalized weakness); anticipate discharge home with home health vs snf.   Expected Discharge Date:   (unknown)               Expected Discharge Plan:  [place]  In-House Referral:  NA  Discharge planning Services  CM Consult  Post Acute Care Choice:    Choice offered to:     DME Arranged:    DME Agency:     HH Arranged:    HH Agency:     Status of Service:  In process, will continue to follow  If discussed at Long Length of Stay Meetings, dates discussed:    Additional Comments:  Fuller Mandril, RN 08/24/2016, 9:46 AM

## 2016-08-24 NOTE — ED Notes (Signed)
Lunch tray ordered; heart healthy diet 

## 2016-08-24 NOTE — Progress Notes (Signed)
*  PRELIMINARY RESULTS* Vascular Ultrasound Lower extremity venous duplex has been completed.  Technically limited due to patient body habitus. Very few veins were visualized. Of the visualized veins, No DVT is noted.     Landry Mellow, RDMS, RVT  08/24/2016, 3:29 PM

## 2016-08-24 NOTE — Progress Notes (Signed)
PT Cancellation Note  Patient Details Name: Brian Richmond MRN: [id number] DOB: [birth year]   Cancelled Treatment:    Reason Eval/Treat Not Completed: Patient at procedure or test/unavailable  Pt currently out of room for testing. Will reattempt as schedule allows.   Leighton Ruff, PT, DPT  Acute Rehabilitation Services  Pager: [phone]   Brian Richmond 08/24/2016, 3:22 PM

## 2016-08-24 NOTE — Progress Notes (Signed)
PROGRESS NOTE                                                                                                                                                                                                             Patient Demographics:    Brian Richmond, is a 61 y.o. male, DOB - 09-28-1955, KLK:[id number]  Admit date - 08/23/2016   Admitting Physician Ivor Costa, MD  Outpatient Primary MD for the patient is Hulan Fess, MD  LOS - 0  Outpatient Specialists: Dr. Earle Gell  Chief Complaint  Patient presents with  . Rectal Bleeding  . Weakness       Brief Narrative   61 year old morbidly obese male with history of hypertension, diabetes mellitus, hypothyroidism, depression, OSA on CPAP, restless leg syndrome, colon cancer in 2010 status post partial colonic resection with last repeat colonoscopy in 2015, BPH who presented with bright red blood per rectum, right leg numbness and generalized weakness. Patient reports having generalized weakness for past 4-6 weeks with associated twitching pain on his right thigh. He reports being frequently constipated and denies any urinary symptoms (except for urgency and hesitancy associated with BPH). Patient noticed bright red blood per rectum on 2-3 episodes since past 3 days. Informs having similar symptoms in the past which is associated with straining during bowel movement. The ED FOBT was positive. Hemoglobin of 12.3, normal WBC, d-dimer of 3.22. Remaining labs were stable. He was also found to be in A. fib with RVR. Chest x-ray was unremarkable. CT of the abdomen and pelvis showed stool burden without other acute findings. Placed on telemetry.   Subjective:   Patient denies any abdominal pain, further blood per rectum or pain in his legs.  Assessment  & Plan :    Principal Problem:   New onset atrial fibrillation With RVR ([place]) Started on Cardizem drip but heart rate  normalized so it was discontinued and placed on metoprolol twice a day. Serial troponins negative. Pending 2-D echo. Mildly elevated TSH, increase Synthroid dose.Marland Kitchen Urine drug screen negative. Cardiology consult appreciated Cannot use anticoagulation due to his acute rectal bleed.  Active Problems: Rectal bleed Suspect diverticular versus due to hemorrhoids. Hemoglobin currently stable. Monitor serial H&H continue PPI twice a day. Patient reports taking Advil (up to 800 mg a day) for past several weeks. Monitor H&H closely, type  and screen and transfuse as needed. Reports having colonoscopy with polyp in 2015. (Was done by Dr. Earle Gell). If further drop in H&H will consult GI.  Positive d-dimer No chest pain symptoms, leg swellings or shortness of breath. Sats stable on room air. Low probability for PE. Doppler lower extremity ordered.   Hypothyroidism TSH elevated to 4.41. Will increase Synthroid.   Right leg numbness with? restless leg syndrome Ongoing for past several weeks. On empiric pramipexole and admission. Patient refused MRI of the lumbar spine stating he is very claustrophobic. Order PT/OT. Patient is mostly wheelchair bound.  Constipation Patient reports taking MS Contin and Percocet for pain. New MiraLAX and Senokot.  BPH Continue Flomax and oxybutynin  OSA Continue nighttime CPAP  Diabetes mellitus type 2 with neuropathy Monitor on SSI. Check A1c (patient on Amaryl and metformin at home)  Essential hypertension Continue lisinopril.  Depression Stable. Continue Cymbalta.   Code Status : Full code  Family Communication  : None at bedside  Disposition Plan  : Home once improved  Barriers For Discharge : Active symptoms  Consults  :  Cardiology  Procedures  :  2-D echo CT abdomen and pelvis  DVT Prophylaxis  : SCDs once Doppler lower extremity completed  Lab Results  Component Value Date   PLT 292 08/24/2016    Antibiotics  :    Anti-infectives    None        Objective:   Vitals:   08/24/16 1030 08/24/16 1100 08/24/16 1200 08/24/16 1300  BP: 111/75 110/85 120/72 117/69  Pulse: 60 82 (!) 51 63  Resp: 13 18    Temp:      TempSrc:      SpO2: 99% 100% 94% 99%  Weight:      Height:        Wt Readings from Last 3 Encounters:  08/23/16 (!) 213.6 kg (471 lb)  10/13/14 (!) 213.6 kg (471 lb)  12/05/13 (!) 213.6 kg (471 lb)     Intake/Output Summary (Last 24 hours) at 08/24/16 1406 Last data filed at 08/24/16 0849  Gross per 24 hour  Intake                0 ml  Output              550 ml  Net             -550 ml     Physical Exam  Gen: Middle aged morbidly obese male not in distress HEENT: no pallor, moist mucosa, supple neck Chest: clear b/l, no added sounds CVS: S1 and S2 is irregularly irregular, no murmurs rubs or gallop GI: soft, NT, ND, BS+ Musculoskeletal: warm, no edema, tenderness CNS: Alert and oriented, normal strength and tone in bilateral lower extremities    Data Review:    CBC  Recent Labs Lab 08/23/16 1240 08/24/16 0317 08/24/16 1120  WBC 8.9 9.4 8.7  HGB 12.3* 13.3 11.9*  HCT 37.0* 39.4 35.9*  PLT 291 290 292  MCV 92.0 91.2 92.8  MCH 30.6 30.8 30.7  MCHC 33.2 33.8 33.1  RDW 13.7 13.5 14.1    Chemistries   Recent Labs Lab 08/23/16 1240 08/23/16 2222 08/24/16 1120  NA 135  --  137  K 4.1  --  4.1  CL 103  --  103  CO2 20*  --  26  GLUCOSE 131*  --  112*  BUN 11  --  10  CREATININE 0.94  --  0.94  CALCIUM 9.0  --  9.2  MG  --  2.0  --    ------------------------------------------------------------------------------------------------------------------  Recent Labs  08/24/16 0317  CHOL 174  HDL 32*  LDLCALC 120*  TRIG 110  CHOLHDL 5.4    Lab Results  Component Value Date   HGBA1C 5.8 (H) 08/24/2016   ------------------------------------------------------------------------------------------------------------------  Recent Labs   08/23/16 2222  TSH 4.818*   ------------------------------------------------------------------------------------------------------------------ No results for input(s): VITAMINB12, FOLATE, FERRITIN, TIBC, IRON, RETICCTPCT in the last 72 hours.  Coagulation profile  Recent Labs Lab 08/24/16 0317  INR 1.13     Recent Labs  08/23/16 2222  DDIMER 3.22*    Cardiac Enzymes  Recent Labs Lab 08/24/16 0317 08/24/16 1120  TROPONINI <0.03 <0.03   ------------------------------------------------------------------------------------------------------------------    Component Value Date/Time   BNP 129.9 (H) 08/24/2016 0317    Inpatient Medications  Scheduled Meds: . DULoxetine  30 mg Oral Daily  . gabapentin  100 mg Oral BID  . insulin aspart  0-5 Units Subcutaneous QHS  . insulin aspart  0-9 Units Subcutaneous TID WC  . levothyroxine  25 mcg Oral QAC breakfast  . lisinopril  10 mg Oral q morning - 10a  . metoprolol tartrate  25 mg Oral BID  . morphine  30 mg Oral Q8H  . oxybutynin  5 mg Oral TID  . pantoprazole (PROTONIX) IV  40 mg Intravenous Q12H  . polyethylene glycol  17 g Oral Daily  . pramipexole  0.125 mg Oral QHS  . senna-docusate  1 tablet Oral BID  . tamsulosin  0.4 mg Oral Daily   Continuous Infusions: PRN Meds:.acetaminophen, cyclobenzaprine, hydrALAZINE, ondansetron (ZOFRAN) IV, oxyCODONE-acetaminophen, pseudoephedrine, zolpidem  Micro Results No results found for this or any previous visit (from the past 240 hour(s)).  Radiology Reports Dg Chest 2 View  Result Date: 08/23/2016 CLINICAL DATA:  Atrial fibrillation, weakness and rectal bleeding. EXAM: CHEST  2 VIEW COMPARISON:  None. FINDINGS: The heart size and mediastinal contours are within normal limits. There is no evidence of pulmonary edema, consolidation, pneumothorax, nodule or pleural fluid. The visualized skeletal structures are unremarkable. IMPRESSION: No active cardiopulmonary disease.  Electronically Signed   By: Aletta Edouard M.D.   On: 08/23/2016 22:06   Ct Abdomen Pelvis W Contrast  Result Date: 08/23/2016 CLINICAL DATA:  Abdominal pain query gastroenteritis or colitis. EXAM: CT ABDOMEN AND PELVIS WITH CONTRAST TECHNIQUE: Multidetector CT imaging of the abdomen and pelvis was performed using the standard protocol following bolus administration of intravenous contrast. CONTRAST:  155mL ISOVUE-300 IOPAMIDOL (ISOVUE-300) INJECTION 61% COMPARISON:  None. FINDINGS: Lower chest: Coronary arteriosclerosis. Heart is top-normal in size. No pericardial effusion. There is aortic atherosclerosis. Hepatobiliary: Hepatic steatosis. Calcification in the right hepatic lobe consistent with a granuloma. Mild distention of the gallbladder with probable biliary sludge noted within. No wall thickening or pericholecystic fluid. No biliary dilatation. Pancreas: Mild fatty atrophy without mass or ductal dilatation. Spleen: Normal without mass. 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 The kidneys, ureters and bladder are nonacute. Stomach/Bowel: Contracted stomach. Normal small bowel rotation without inflammation or distention. No obstruction. Normal appendix. A moderate degree of fecal residue is noted within the cecum and ascending colon as well as distal descending colon through rectum. Vascular/Lymphatic: Aortoiliac atherosclerosis without aneurysm. No adenopathy. Reproductive: Normal size prostate. Other: Ventral midline scarring. Small fat containing umbilical hernia. Musculoskeletal: Thoracolumbar spondylosis. No acute osseous abnormality. IMPRESSION: 1. Moderate amount of fecal residue within large bowel without inflammation.  Query constipation. 2. Coronary arteriosclerosis. 3. Hepatic steatosis with right hepatic granuloma. Biliary sludge noted within the gallbladder without evidence of acute cholecystitis. 4. Aortoiliac  atherosclerosis. 5. Thoracolumbar spondylosis. Electronically Signed   By: Ashley Royalty M.D.   On: 08/23/2016 21:58    Time Spent in minutes  20   Louellen Molder M.D on 08/24/2016 at 2:06 PM  Between 7am to 7pm - Pager - [phone]  After 7pm go to [email or web address] - password [facility]  [facility] -  Office  [phone]

## 2016-08-25 ENCOUNTER — Observation Stay (HOSPITAL_BASED_OUTPATIENT_CLINIC_OR_DEPARTMENT_OTHER): Payer: Medicare HMO

## 2016-08-25 DIAGNOSIS — K922 Gastrointestinal hemorrhage, unspecified: Secondary | ICD-10-CM | POA: Diagnosis not present

## 2016-08-25 DIAGNOSIS — I4891 Unspecified atrial fibrillation: Secondary | ICD-10-CM | POA: Diagnosis not present

## 2016-08-25 DIAGNOSIS — K625 Hemorrhage of anus and rectum: Secondary | ICD-10-CM

## 2016-08-25 DIAGNOSIS — E038 Other specified hypothyroidism: Secondary | ICD-10-CM | POA: Diagnosis not present

## 2016-08-25 DIAGNOSIS — K5909 Other constipation: Secondary | ICD-10-CM

## 2016-08-25 DIAGNOSIS — R531 Weakness: Secondary | ICD-10-CM

## 2016-08-25 DIAGNOSIS — I351 Nonrheumatic aortic (valve) insufficiency: Secondary | ICD-10-CM | POA: Diagnosis not present

## 2016-08-25 DIAGNOSIS — G4733 Obstructive sleep apnea (adult) (pediatric): Secondary | ICD-10-CM | POA: Diagnosis not present

## 2016-08-25 DIAGNOSIS — G2581 Restless legs syndrome: Secondary | ICD-10-CM | POA: Diagnosis not present

## 2016-08-25 DIAGNOSIS — Z9989 Dependence on other enabling machines and devices: Secondary | ICD-10-CM

## 2016-08-25 DIAGNOSIS — K648 Other hemorrhoids: Secondary | ICD-10-CM | POA: Diagnosis not present

## 2016-08-25 DIAGNOSIS — Z85038 Personal history of other malignant neoplasm of large intestine: Secondary | ICD-10-CM | POA: Diagnosis not present

## 2016-08-25 DIAGNOSIS — Z6841 Body Mass Index (BMI) 40.0 and over, adult: Secondary | ICD-10-CM | POA: Diagnosis not present

## 2016-08-25 DIAGNOSIS — K5903 Drug induced constipation: Secondary | ICD-10-CM | POA: Diagnosis not present

## 2016-08-25 LAB — ECHOCARDIOGRAM COMPLETE
Height: 69 in
Weight: 6971.2 oz

## 2016-08-25 LAB — GLUCOSE, CAPILLARY
GLUCOSE-CAPILLARY: 109 mg/dL — AB (ref 65–99)
GLUCOSE-CAPILLARY: 166 mg/dL — AB (ref 65–99)
Glucose-Capillary: 116 mg/dL — ABNORMAL HIGH (ref 65–99)
Glucose-Capillary: 231 mg/dL — ABNORMAL HIGH (ref 65–99)

## 2016-08-25 MED ORDER — MAGNESIUM CITRATE PO SOLN
1.0000 | Freq: Once | ORAL | Status: AC
  Administered 2016-08-25: 1 via ORAL
  Filled 2016-08-25: qty 296

## 2016-08-25 MED ORDER — PERFLUTREN LIPID MICROSPHERE
1.0000 mL | INTRAVENOUS | Status: AC | PRN
  Administered 2016-08-25: 2 mL via INTRAVENOUS
  Filled 2016-08-25: qty 10

## 2016-08-25 MED ORDER — BISACODYL 5 MG PO TBEC
10.0000 mg | DELAYED_RELEASE_TABLET | Freq: Every day | ORAL | Status: DC | PRN
  Administered 2016-08-28: 10 mg via ORAL
  Filled 2016-08-25: qty 2

## 2016-08-25 NOTE — Care Management Obs Status (Signed)
[place] NOTIFICATION   Patient Details  Name: Brian Richmond MRN: [id number] Date of Birth: [birth year]   Medicare Observation Status Notification Given:  Yes    Erenest Rasher, RN 08/25/2016, 10:48 AM

## 2016-08-25 NOTE — Consult Note (Signed)
[place] Reason for consult: hematochezia and gentlemen who has new onset atrial fibrillation  Referring Physician: [facility]. PCP: Dr. Hulan Fess. Primary G.I.: Dr. Earle Gell  Brian Richmond is an 61 y.o. male.  HPI: he has a history of hypertension type II diabetes hypothyroidism and fairly significant obstructive sleep apnea utilizing CPAP. He is morbidly obese. He's had a history of colon cancer underwent resection of the sigmoid colon 3/10 [facility] with removal of a T3 in 0 M0 sigmoid cancer. He has undergone surveillance colonoscopies by Dr. Wynetta Emery last being and 9/16 with no polyps or evidence of recurrence. He is chronically constipated and usually has bowel movements about every 5 to 6 days. 5 days ago quite constipated had to strain with stool and ended up with hematochezia  with the toilet bowl full of blood.. He has not had a bowel movement since that time in his head bright blood spots in his underwear. This apparently is his normal pattern. This is been going on ever since his sigmoid colon cancer surgery in 2010. He has issues with chronic pain due to his obesity and arthritis and basically has been bedbound. He takes chronic narcotic pain medication and has for some time which is contributed to his constipation. It's notable that he has been unable to tolerate colonoscopy prep's in the past and had to have several days of magnesium citrate in order to clean out enough for a reasonable colonoscopy. He was admitted yesterday with new onset of atrial fibrillation with RVR with rates up to 150 and was started on Lopressor party is him and his rate is now controlled. He has been seen by cardiology. He has felt to be at risk for strokes should be anticoagulated. Were asked to see him about his rectal bleeding. He did have a CT scan of his abdomen showing marked fecal material within the bowel without clear-cut colonic masses. He does have fatty liver with biliary  sludge in the gallbladder.   Past Medical History:  Diagnosis Date  . Carpal tunnel syndrome    bilateral, and neuropathy bilateral feet  . Colon cancer ([place]) 03/2008   surgery only  . Diabetes mellitus without complication ([place])   . Hx of seasonal allergies   . Hypertension   . Neuropathy   . Neuropathy   . Obesity    BMI 68.0 kg  . OSA (obstructive sleep apnea)    uses cpap nightly  . Reduced mobility    06-27-13 states can transfer self.limited on distances-uses walker and wheelchair mostly.    Past Surgical History:  Procedure Laterality Date  . COLON SURGERY  03/2008  . COLONOSCOPY WITH PROPOFOL N/A 07/08/2013   Procedure: COLONOSCOPY WITH PROPOFOL;  Surgeon: [name], MD;  Location: [facility];  Service: Endoscopy;  Laterality: N/A;  . COLONOSCOPY WITH PROPOFOL N/A 10/13/2014   Procedure: COLONOSCOPY WITH PROPOFOL;  Surgeon: [name], MD;  Location: [facility];  Service: Endoscopy;  Laterality: N/A;  . FOOT SURGERY     ingrown toenails     Family History  Problem Relation Age of Onset  . Ovarian cancer Mother   . Cancer Mother   . Heart disease Mother   . Arthritis Father     Social History:  reports that he has never smoked. He has never used smokeless tobacco. He reports that he does not drink alcohol or use drugs.  Allergies: No Known Allergies  Medications; Prior to Admission medications   Medication Sig  Start Date End Date Taking? Authorizing Provider  cyclobenzaprine (FLEXERIL) 10 MG tablet Take 1 tablet (10 mg total) by mouth 3 (three) times daily as needed for muscle spasms. 06/26/16  Yes Leandrew Koyanagi, MD  DULoxetine (CYMBALTA) 30 MG capsule TAKE 1 CAPSULE EVERY DAY Patient taking differently: Take 30 mg by mouth at bedtime 07/11/16  Yes Meredith Staggers, MD  gabapentin (NEURONTIN) 100 MG capsule TAKE 1 CAPSULE(100 MG) BY MOUTH THREE TIMES DAILY Patient taking differently: Take 100 mg by mouth two times a day (MORNING and EVENING)  08/23/16  Yes Bayard Hugger, NP  glimepiride (AMARYL) 2 MG tablet Take 3 mg by mouth at bedtime.  08/22/16  Yes [provider]  levothyroxine (SYNTHROID, LEVOTHROID) 25 MCG tablet Take 25 mcg by mouth daily.  07/30/14  Yes [provider]  lisinopril (PRINIVIL,ZESTRIL) 10 MG tablet Take 10 mg by mouth every morning.  06/11/12  Yes [provider]  metFORMIN (GLUCOPHAGE-XR) 500 MG 24 hr tablet Take 500 mg by mouth 2 (two) times daily.  04/14/12  Yes [provider]  morphine (MS CONTIN) 30 MG 12 hr tablet Take 1 tablet (30 mg total) by mouth 3 (three) times daily. 07/03/16  Yes Bayard Hugger, NP  nystatin (MYCOSTATIN) 100000 UNIT/ML suspension SWISH AND SWALLOW 5 ml's once a day as needed for thrush 09/02/15  Yes [provider]  oxybutynin (DITROPAN) 5 MG tablet Take 5 mg by mouth 3 (three) times daily.  01/31/13  Yes [provider]  oxyCODONE-acetaminophen (PERCOCET) 7.5-325 MG tablet Take 1 tablet by mouth every 6 (six) hours as needed. Patient taking differently: Take 1 tablet by mouth 3 (three) times daily.  07/03/16  Yes Bayard Hugger, NP  pseudoephedrine (SUDAFED) 30 MG tablet Take 30 mg by mouth every 6 (six) hours as needed for congestion.    Yes [provider]  Tamsulosin HCl (FLOMAX) 0.4 MG CAPS Take 0.4 mg by mouth daily.  11/25/11  Yes [provider]   . DULoxetine  30 mg Oral Daily  . gabapentin  100 mg Oral BID  . insulin aspart  0-5 Units Subcutaneous QHS  . insulin aspart  0-9 Units Subcutaneous TID WC  . levothyroxine  50 mcg Oral QAC breakfast  . lisinopril  10 mg Oral q morning - 10a  . magnesium citrate  1 Bottle Oral Once  . metoprolol tartrate  25 mg Oral BID  . morphine  30 mg Oral Q8H  . oxybutynin  5 mg Oral TID  . pantoprazole (PROTONIX) IV  40 mg Intravenous Q12H  . polyethylene glycol  17 g Oral BID  . pramipexole  0.125 mg Oral QHS  . senna-docusate  1 tablet Oral BID  . tamsulosin  0.4 mg  Oral Daily   PRN Meds acetaminophen, bisacodyl, cyclobenzaprine, hydrALAZINE, ondansetron (ZOFRAN) IV, oxyCODONE-acetaminophen, pseudoephedrine, zolpidem Results for orders placed or performed during the hospital encounter of 08/23/16 (from the past 48 hour(s))  Type and screen [place]     Status: None   Collection Time: 08/23/16 12:33 PM  Result Value Ref Range   ABO/RH(D) A POS    Antibody Screen NEG    Sample Expiration 08/26/2016   CBG monitoring, ED     Status: Abnormal   Collection Time: 08/23/16 12:37 PM  Result Value Ref Range   Glucose-Capillary 130 (H) 65 - 99 mg/dL  Basic metabolic panel     Status: Abnormal   Collection Time: 08/23/16 12:40  PM  Result Value Ref Range   Sodium 135 135 - 145 mmol/L   Potassium 4.1 3.5 - 5.1 mmol/L   Chloride 103 101 - 111 mmol/L   CO2 20 (L) 22 - 32 mmol/L   Glucose, Bld 131 (H) 65 - 99 mg/dL   BUN 11 6 - 20 mg/dL   Creatinine, Ser 0.94 0.61 - 1.24 mg/dL   Calcium 9.0 8.9 - 10.3 mg/dL   GFR calc non Af Amer >60 >60 mL/min   GFR calc Af Amer >60 >60 mL/min    Comment: (NOTE) The eGFR has been calculated using the CKD EPI equation. This calculation has not been validated in all clinical situations. eGFR's persistently <60 mL/min signify possible Chronic Kidney Disease.    Anion gap 12 5 - 15  CBC     Status: Abnormal   Collection Time: 08/23/16 12:40 PM  Result Value Ref Range   WBC 8.9 4.0 - 10.5 K/uL   RBC 4.02 (L) 4.22 - 5.81 MIL/uL   Hemoglobin 12.3 (L) 13.0 - 17.0 g/dL   HCT 37.0 (L) 39.0 - 52.0 %   MCV 92.0 78.0 - 100.0 fL   MCH 30.6 26.0 - 34.0 pg   MCHC 33.2 30.0 - 36.0 g/dL   RDW 13.7 11.5 - 15.5 %   Platelets 291 150 - 400 K/uL  ABO/Rh     Status: None   Collection Time: 08/23/16 12:40 PM  Result Value Ref Range   ABO/RH(D) A POS   Urinalysis, Routine w reflex microscopic     Status: None   Collection Time: 08/23/16  1:05 PM  Result Value Ref Range   Color, Urine YELLOW YELLOW   APPearance  CLEAR CLEAR   Specific Gravity, Urine 1.018 1.005 - 1.030   pH 6.0 5.0 - 8.0   Glucose, UA NEGATIVE NEGATIVE mg/dL   Hgb urine dipstick NEGATIVE NEGATIVE   Bilirubin Urine NEGATIVE NEGATIVE   Ketones, ur NEGATIVE NEGATIVE mg/dL   Protein, ur NEGATIVE NEGATIVE mg/dL   Nitrite NEGATIVE NEGATIVE   Leukocytes, UA NEGATIVE NEGATIVE  Rapid urine drug screen (hospital performed)     Status: Abnormal   Collection Time: 08/23/16  1:05 PM  Result Value Ref Range   Opiates POSITIVE (A) NONE DETECTED   Cocaine NONE DETECTED NONE DETECTED   Benzodiazepines NONE DETECTED NONE DETECTED   Amphetamines NONE DETECTED NONE DETECTED   Tetrahydrocannabinol NONE DETECTED NONE DETECTED   Barbiturates NONE DETECTED NONE DETECTED    Comment:        DRUG SCREEN FOR MEDICAL PURPOSES ONLY.  IF CONFIRMATION IS NEEDED FOR ANY PURPOSE, NOTIFY LAB WITHIN 5 DAYS.        LOWEST DETECTABLE LIMITS FOR URINE DRUG SCREEN Drug Class       Cutoff (ng/mL) Amphetamine      1000 Barbiturate      200 Benzodiazepine   086 Tricyclics       578 Opiates          300 Cocaine          300 THC              50   I-stat troponin, ED     Status: None   Collection Time: 08/23/16  9:18 PM  Result Value Ref Range   Troponin i, poc 0.00 0.00 - 0.08 ng/mL   Comment 3            Comment: Due to the release kinetics of cTnI, a negative result  within the first hours of the onset of symptoms does not rule out myocardial infarction with certainty. If myocardial infarction is still suspected, repeat the test at appropriate intervals.   D-dimer, quantitative     Status: Abnormal   Collection Time: 08/23/16 10:22 PM  Result Value Ref Range   D-Dimer, Quant 3.22 (H) 0.00 - 0.50 ug/mL-FEU    Comment: (NOTE) At the manufacturer cut-off of 0.50 ug/mL FEU, this assay has been documented to exclude PE with a sensitivity and negative predictive value of 97 to 99%.  At this time, this assay has not been approved by the FDA to exclude  DVT/VTE. Results should be correlated with clinical presentation.   TSH     Status: Abnormal   Collection Time: 08/23/16 10:22 PM  Result Value Ref Range   TSH 4.818 (H) 0.350 - 4.500 uIU/mL    Comment: Performed by a 3rd Generation assay with a functional sensitivity of <=0.01 uIU/mL.  Magnesium     Status: None   Collection Time: 08/23/16 10:22 PM  Result Value Ref Range   Magnesium 2.0 1.7 - 2.4 mg/dL  POC occult blood, ED     Status: Abnormal   Collection Time: 08/23/16 11:55 PM  Result Value Ref Range   Fecal Occult Bld POSITIVE (A) NEGATIVE  Protime-INR     Status: None   Collection Time: 08/24/16  3:17 AM  Result Value Ref Range   Prothrombin Time 14.5 11.4 - 15.2 seconds   INR 1.13   APTT     Status: None   Collection Time: 08/24/16  3:17 AM  Result Value Ref Range   aPTT 25 24 - 36 seconds  CBC     Status: None   Collection Time: 08/24/16  3:17 AM  Result Value Ref Range   WBC 9.4 4.0 - 10.5 K/uL   RBC 4.32 4.22 - 5.81 MIL/uL   Hemoglobin 13.3 13.0 - 17.0 g/dL   HCT 39.4 39.0 - 52.0 %   MCV 91.2 78.0 - 100.0 fL   MCH 30.8 26.0 - 34.0 pg   MCHC 33.8 30.0 - 36.0 g/dL   RDW 13.5 11.5 - 15.5 %   Platelets 290 150 - 400 K/uL  Brain natriuretic peptide     Status: Abnormal   Collection Time: 08/24/16  3:17 AM  Result Value Ref Range   B Natriuretic Peptide 129.9 (H) 0.0 - 100.0 pg/mL  Lipid panel     Status: Abnormal   Collection Time: 08/24/16  3:17 AM  Result Value Ref Range   Cholesterol 174 0 - 200 mg/dL   Triglycerides 110 <150 mg/dL   HDL 32 (L) >40 mg/dL   Total CHOL/HDL Ratio 5.4 RATIO   VLDL 22 0 - 40 mg/dL   LDL Cholesterol 120 (H) 0 - 99 mg/dL    Comment:        Total Cholesterol/HDL:CHD Risk Coronary Heart Disease Risk Table                     Men   Women  1/2 Average Risk   3.4   3.3  Average Risk       5.0   4.4  2 X Average Risk   9.6   7.1  3 X Average Risk  23.4   11.0        Use the calculated Patient Ratio above and the CHD Risk  Table to determine the patient's CHD Risk.  ATP III CLASSIFICATION (LDL):  <100     mg/dL   Optimal  100-129  mg/dL   Near or Above                    Optimal  130-159  mg/dL   Borderline  160-189  mg/dL   High  >190     mg/dL   Very High   Troponin I (q 6hr x 3)     Status: None   Collection Time: 08/24/16  3:17 AM  Result Value Ref Range   Troponin I <0.03 <0.03 ng/mL  HIV antibody (Routine Testing)     Status: None   Collection Time: 08/24/16  3:17 AM  Result Value Ref Range   HIV Screen 4th Generation wRfx Non Reactive Non Reactive    Comment: (NOTE) Performed At: [facility] [place], [place] [id number] Lindon Romp MD HC:[id number]   CBG monitoring, ED     Status: Abnormal   Collection Time: 08/24/16  8:30 AM  Result Value Ref Range   Glucose-Capillary 104 (H) 65 - 99 mg/dL  CBC     Status: Abnormal   Collection Time: 08/24/16 11:20 AM  Result Value Ref Range   WBC 8.7 4.0 - 10.5 K/uL   RBC 3.87 (L) 4.22 - 5.81 MIL/uL   Hemoglobin 11.9 (L) 13.0 - 17.0 g/dL   HCT 35.9 (L) 39.0 - 52.0 %   MCV 92.8 78.0 - 100.0 fL   MCH 30.7 26.0 - 34.0 pg   MCHC 33.1 30.0 - 36.0 g/dL   RDW 14.1 11.5 - 15.5 %   Platelets 292 150 - 400 K/uL  T4, free     Status: None   Collection Time: 08/24/16 11:20 AM  Result Value Ref Range   Free T4 0.78 0.61 - 1.12 ng/dL    Comment: (NOTE) Biotin ingestion may interfere with free T4 tests. If the results are inconsistent with the TSH level, previous test results, or the clinical presentation, then consider biotin interference. If needed, order repeat testing after stopping biotin.   Troponin I (q 6hr x 3)     Status: None   Collection Time: 08/24/16 11:20 AM  Result Value Ref Range   Troponin I <0.03 <0.03 ng/mL  Basic metabolic panel     Status: Abnormal   Collection Time: 08/24/16 11:20 AM  Result Value Ref Range   Sodium 137 135 - 145 mmol/L   Potassium 4.1 3.5 - 5.1 mmol/L   Chloride 103 101 - 111  mmol/L   CO2 26 22 - 32 mmol/L   Glucose, Bld 112 (H) 65 - 99 mg/dL   BUN 10 6 - 20 mg/dL   Creatinine, Ser 0.94 0.61 - 1.24 mg/dL   Calcium 9.2 8.9 - 10.3 mg/dL   GFR calc non Af Amer >60 >60 mL/min   GFR calc Af Amer >60 >60 mL/min    Comment: (NOTE) The eGFR has been calculated using the CKD EPI equation. This calculation has not been validated in all clinical situations. eGFR's persistently <60 mL/min signify possible Chronic Kidney Disease.    Anion gap 8 5 - 15  Hemoglobin A1c     Status: Abnormal   Collection Time: 08/24/16 11:30 AM  Result Value Ref Range   Hgb A1c MFr Bld 5.8 (H) 4.8 - 5.6 %    Comment: (NOTE) Pre diabetes:          5.7%-6.4% Diabetes:              >  6.4% Glycemic control for   <7.0% adults with diabetes    Mean Plasma Glucose 119.76 mg/dL  CBG monitoring, ED     Status: Abnormal   Collection Time: 08/24/16 11:58 AM  Result Value Ref Range   Glucose-Capillary 124 (H) 65 - 99 mg/dL  Glucose, capillary     Status: Abnormal   Collection Time: 08/24/16  5:22 PM  Result Value Ref Range   Glucose-Capillary 220 (H) 65 - 99 mg/dL  MRSA PCR Screening     Status: None   Collection Time: 08/24/16  5:36 PM  Result Value Ref Range   MRSA by PCR NEGATIVE NEGATIVE    Comment:        The GeneXpert MRSA Assay (FDA approved for NASAL specimens only), is one component of a comprehensive MRSA colonization surveillance program. It is not intended to diagnose MRSA infection nor to guide or monitor treatment for MRSA infections.   CBC     Status: Abnormal   Collection Time: 08/24/16  6:49 PM  Result Value Ref Range   WBC 8.6 4.0 - 10.5 K/uL   RBC 3.98 (L) 4.22 - 5.81 MIL/uL   Hemoglobin 12.3 (L) 13.0 - 17.0 g/dL   HCT 36.7 (L) 39.0 - 52.0 %   MCV 92.2 78.0 - 100.0 fL   MCH 30.9 26.0 - 34.0 pg   MCHC 33.5 30.0 - 36.0 g/dL   RDW 13.6 11.5 - 15.5 %   Platelets 236 150 - 400 K/uL  Glucose, capillary     Status: Abnormal   Collection Time: 08/24/16  8:57 PM   Result Value Ref Range   Glucose-Capillary 129 (H) 65 - 99 mg/dL  Glucose, capillary     Status: Abnormal   Collection Time: 08/25/16  7:29 AM  Result Value Ref Range   Glucose-Capillary 116 (H) 65 - 99 mg/dL  Glucose, capillary     Status: Abnormal   Collection Time: 08/25/16 11:33 AM  Result Value Ref Range   Glucose-Capillary 109 (H) 65 - 99 mg/dL    Dg Chest 2 View  Result Date: 08/23/2016 CLINICAL DATA:  Atrial fibrillation, weakness and rectal bleeding. EXAM: CHEST  2 VIEW COMPARISON:  None. FINDINGS: The heart size and mediastinal contours are within normal limits. There is no evidence of pulmonary edema, consolidation, pneumothorax, nodule or pleural fluid. The visualized skeletal structures are unremarkable. IMPRESSION: No active cardiopulmonary disease. Electronically Signed   By: Aletta Edouard M.D.   On: 08/23/2016 22:06   Ct Abdomen Pelvis W Contrast  Result Date: 08/23/2016 CLINICAL DATA:  Abdominal pain query gastroenteritis or colitis. EXAM: CT ABDOMEN AND PELVIS WITH CONTRAST TECHNIQUE: Multidetector CT imaging of the abdomen and pelvis was performed using the standard protocol following bolus administration of intravenous contrast. CONTRAST:  162m ISOVUE-300 IOPAMIDOL (ISOVUE-300) INJECTION 61% COMPARISON:  None. FINDINGS: Lower chest: Coronary arteriosclerosis. Heart is top-normal in size. No pericardial effusion. There is aortic atherosclerosis. Hepatobiliary: Hepatic steatosis. Calcification in the right hepatic lobe consistent with a granuloma. Mild distention of the gallbladder with probable biliary sludge noted within. No wall thickening or pericholecystic fluid. No biliary dilatation. Pancreas: Mild fatty atrophy without mass or ductal dilatation. Spleen: Normal without mass. 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 The kidneys, ureters and bladder are nonacute. Stomach/Bowel: Contracted stomach. Normal  small bowel rotation without inflammation or distention. No obstruction. Normal appendix. A moderate degree of fecal residue is noted within the cecum and ascending colon as well as distal  descending colon through rectum. Vascular/Lymphatic: Aortoiliac atherosclerosis without aneurysm. No adenopathy. Reproductive: Normal size prostate. Other: Ventral midline scarring. Small fat containing umbilical hernia. Musculoskeletal: Thoracolumbar spondylosis. No acute osseous abnormality. IMPRESSION: 1. Moderate amount of fecal residue within large bowel without inflammation. Query constipation. 2. Coronary arteriosclerosis. 3. Hepatic steatosis with right hepatic granuloma. Biliary sludge noted within the gallbladder without evidence of acute cholecystitis. 4. Aortoiliac atherosclerosis. 5. Thoracolumbar spondylosis. Electronically Signed   By: Ashley Royalty M.D.   On: 08/23/2016 21:58               Blood pressure 115/64, pulse 75, temperature 98.2 F (36.8 C), temperature source Oral, resp. rate 13, height _0  (1.753 m), weight (!) 197.6 kg (435 lb 11.2 oz), SpO2 96 %.  Physical exam:   General--morbidly obese white male sitting in a chair  ENT--nonicteric  Neck--. Short fat neck supple  Heart--irregular rhythm  Lungs--grossly clear  Abdomen--morbidly obese but apparently nontender  Psych--alert and oriented, mood and affect. Appropriate answers questions appropriately   Assessment: 1. Hematochezia. This is a chronic problem for him when constipated and is very likely due to hemorrhoids. He probably should have at least a sigmoidoscopy prior to beginning anticoagulation. 2. Chronic constipation. This is a chronic problem for him and due to chronic narcotic used to his multiple musculoskeletal pains and lack of mobility. This apparently is been refractory to treatment in the past. 3. Status post sigmoid colectomy 2010 [employer] T-3 in 0 M0 sigmoid colon cancer. Last surveillance colonoscopy 2016 was  negative 4. New onset of atrial fibrillation. RVR is under control. Patient will need to be anticoagulation. 5. Morbid obesity with BMI approximately 70 6. Gallstones 7. Steatohepatitis  Plan: 1. We will proceed with sigmoidoscopy tomorrow morning at approximately 9 o'clock by Dr Alessandra Bevels.. We will prep time with magnesium citrate which he says has worked for number 4 and give Dulcolax tablets as well. We will also plan on enemas until clearer in the morning. This is reasonable to just to the sigmoidoscopy since the patient's problem appears to be related to his constipation and rectum and probably is due to fissures and or hermorrhoids. The more difficult problem will be how to control his bowel movements in the future after anticoagulation has been started.   Hannia Matchett JR,Delando Satter L 08/25/2016, 12:32 PM   This note was created using voice recognition software and minor errors may Have occurred unintentionally. Pager: [phone] If no answer or after hours call [phone]

## 2016-08-25 NOTE — Evaluation (Signed)
Physical Therapy Evaluation Patient Details Name: Brian Richmond MRN: [id number] DOB: [birth year] Today's Date: 08/25/2016   History of Present Illness  Pt admitted with BRBPR, R LE numbness and generalized weakness. Found to have afib with RVR. PMH: morbid obesity, HTN, DNM with neuropathy, hypothyroidism, depression, OSA on cpap, colon cancer s/p partial resection, restless leg.  Clinical Impression  Patient presents with generalized weakness, elevated HR, decreased sensation and impaired mobility s/p above. Tolerated gait training with min guard assist for safety. Pt might benefit from platforms for RW to help reduce hand/shoulder pressure during walking. Pt Mod I PTA and drives. Mainly uses power w/c and walks short distances in home in unaccessible areas. Will continue to follow to maximize independence and mobilty prior to return home.     Follow Up Recommendations No PT follow up;Supervision for mobility/OOB    Equipment Recommendations  None recommended by PT    Recommendations for Other Services       Precautions / Restrictions Precautions Precautions: Fall Precaution Comments: watch HR- A-fib Restrictions Weight Bearing Restrictions: No      Mobility  Bed Mobility Overal bed mobility: Needs Assistance Bed Mobility: Supine to Sit     Supine to sit: Supervision;HOB elevated     General bed mobility comments: increased time  Transfers Overall transfer level: Needs assistance Equipment used: Rolling walker (2 wheeled) Transfers: Sit to/from Stand Sit to Stand: Min guard         General transfer comment: uses momentum, increased time to push into standing with forearms supported by RW. Transferred to powerchair post ambulation to eat lunch.  Ambulation/Gait Ambulation/Gait assistance: Min guard Ambulation Distance (Feet): 60 Feet Assistive device: Rolling walker (2 wheeled) Gait Pattern/deviations: Step-through pattern;Decreased stride length;Wide base of  support;Trunk flexed   Gait velocity interpretation: Below normal speed for age/gender General Gait Details: Decreased knee flexion bilaterally during swing phase; pt leaning on RW with forearms for comfort (cannot use hands due to weakness and pain). 2/4 DOE. HR up to 133 bpm  Stairs            Wheelchair Mobility    Modified Rankin (Stroke Patients Only)       Balance Overall balance assessment: Needs assistance Sitting-balance support: Feet supported;No upper extremity supported Sitting balance-Leahy Scale: Good Sitting balance - Comments: Able to reach outside of BoS and donn socks without difficulty.    Standing balance support: During functional activity Standing balance-Leahy Scale: Poor Standing balance comment: requires B UE support of walker                             Pertinent Vitals/Pain Pain Assessment: Faces Faces Pain Scale: Hurts little more Pain Location: R shoulder with ROM Pain Descriptors / Indicators: Grimacing Pain Intervention(s): Monitored during session    Home Living Family/patient expects to be discharged to:: Private residence Living Arrangements: Spouse/significant other Available Help at Discharge: Family;Available 24 hours/day Type of Home: House Home Access: Ramped entrance     Home Layout: One level Home Equipment: Walker - 2 wheels;Wheelchair - power;Tub bench;Hand held shower head      Prior Function Level of Independence: Independent with assistive device(s)         Comments: walks in home with RW up to 25 ft, otherwise uses motorized w/c; has an aide come in once/week to help with bathing. Drives.      Hand Dominance   Dominant Hand: Right    Extremity/Trunk Assessment  Upper Extremity Assessment Upper Extremity Assessment: Defer to OT evaluation RUE Deficits / Details: pt with carpal tunnel vs peripheral neuropathy with impaired sensation RUE Sensation: history of peripheral neuropathy RUE  Coordination: decreased fine motor LUE Deficits / Details: pt with hx of rotator cuff inflammation per pt (sees Dr. Erlinda Hong), hand also with carpal tunnel vs peripheral neuropathy, FF of shoulder to 60 degrees LUE: Unable to fully assess due to pain LUE Sensation: decreased light touch;history of peripheral neuropathy LUE Coordination: decreased fine motor;decreased gross motor    Lower Extremity Assessment Lower Extremity Assessment: LLE deficits/detail;RLE deficits/detail RLE Deficits / Details: Numbness in inner thigh and into feet- neuropathy RLE Sensation: decreased light touch RLE Coordination: decreased fine motor;decreased gross motor LLE Sensation: decreased light touch (foot)    Cervical / Trunk Assessment Cervical / Trunk Assessment: Other exceptions Cervical / Trunk Exceptions: weakness and pain when standing upright with walker  Communication   Communication: No difficulties  Cognition Arousal/Alertness: Awake/alert Behavior During Therapy: WFL for tasks assessed/performed Overall Cognitive Status: Within Functional Limits for tasks assessed                                        General Comments General comments (skin integrity, edema, etc.): HR ranged from 70s-133 bpm.    Exercises     Assessment/Plan    PT Assessment Patient needs continued PT services  PT Problem List Decreased strength;Decreased mobility;Decreased range of motion;Obesity;Pain;Impaired sensation;Decreased balance;Decreased activity tolerance;Cardiopulmonary status limiting activity       PT Treatment Interventions Therapeutic activities;Gait training;Therapeutic exercise;Patient/family education;Balance training;Functional mobility training;Wheelchair mobility training    PT Goals (Current goals can be found in the Care Plan section)  Acute Rehab PT Goals Patient Stated Goal: to return home PT Goal Formulation: With patient Time For Goal Achievement: 09/08/16 Potential to  Achieve Goals: Good    Frequency Min 3X/week   Barriers to discharge        Co-evaluation PT/OT/SLP Co-Evaluation/Treatment: Yes Reason for Co-Treatment: For patient/therapist safety PT goals addressed during session: Mobility/safety with mobility;Balance;Strengthening/ROM         AM-PAC PT "6 Clicks" Daily Activity  Outcome Measure Difficulty turning over in bed (including adjusting bedclothes, sheets and blankets)?: None Difficulty moving from lying on back to sitting on the side of the bed? : None Difficulty sitting down on and standing up from a chair with arms (e.g., wheelchair, bedside commode, etc,.)?: None Help needed moving to and from a bed to chair (including a wheelchair)?: A Little Help needed walking in hospital room?: A Little Help needed climbing 3-5 steps with a railing? : A Lot 6 Click Score: 20    End of Session   Activity Tolerance: Patient tolerated treatment well;Patient limited by fatigue Patient left: in chair;with call bell/phone within reach Nurse Communication: Mobility status PT Visit Diagnosis: Unsteadiness on feet (R26.81);Muscle weakness (generalized) (M62.81);Pain Pain - Right/Left: Left Pain - part of body: Shoulder    Time: 1130-1155 PT Time Calculation (min) (ACUTE ONLY): 25 min   Charges:   PT Evaluation $PT Eval Moderate Complexity: 1 Mod     PT G Codes:   PT G-Codes **NOT FOR INPATIENT CLASS** Functional Assessment Tool Used: Clinical judgement Functional Limitation: Mobility: Walking and moving around Mobility: Walking and Moving Around Current Status (Z6109): At least 1 percent but less than 20 percent impaired, limited or restricted Mobility: Walking and Moving Around Goal Status (  G8979): At least 1 percent but less than 20 percent impaired, limited or restricted    [place], [place], [place] [phone]    Lacie Draft 08/25/2016, 12:39 PM

## 2016-08-25 NOTE — Progress Notes (Signed)
  Echocardiogram 2D Echocardiogram with Definity has been performed.  Tresa Res 08/25/2016, 3:47 PM

## 2016-08-25 NOTE — Evaluation (Signed)
Occupational Therapy Evaluation Patient Details Name: Brian Richmond MRN: [id number] DOB: Sep 08, 1955 Today's Date: 08/25/2016    History of Present Illness Pt admitted with BRBPR, R LE numbness and generalized weakness. Found to have afib with RVR. PMH: morbid obesity, HTN, DNM with neuropathy, hypothyroidism, depression, OSA on cpap, colon cancer s/p partial resection, restless leg.   Clinical Impression   Pt was using a RW with forearms supported on handles for short distances in his home and an electric w/c otherwise. He is modified independent in ADL. Pt presents with limited UE use with carpal tunnel vs neuropathy in his hands and rotator cuff inflammation in his L shoulder for which he is being treated by ortho. Pt is likely very near his baseline and mobility. Will follow acutely to educate in energy conservation and to instruct in use of walker splints and AE for ADL.     Follow Up Recommendations  Home health OT    Equipment Recommendations  None recommended by OT    Recommendations for Other Services       Precautions / Restrictions Precautions Precautions: Fall Restrictions Weight Bearing Restrictions: No      Mobility Bed Mobility Overal bed mobility: Needs Assistance Bed Mobility: Supine to Sit     Supine to sit: Supervision;HOB elevated     General bed mobility comments: increased time  Transfers Overall transfer level: Needs assistance Equipment used: Rolling walker (2 wheeled) Transfers: Sit to/from Stand Sit to Stand: Min guard         General transfer comment: uses momentum, increased time to push into standing with forearms supported by RW    Balance Overall balance assessment: Needs assistance   Sitting balance-Leahy Scale: Good       Standing balance-Leahy Scale: Poor Standing balance comment: requires B UE support of walker                           ADL either performed or assessed with clinical judgement   ADL Overall  ADL's : At baseline                                     Functional mobility during ADLs: Min guard;Rolling walker General ADL Comments: Pt ambulates with forearms on walker due to his hand slipping off of the handles, educated in walker splints, pt is interested     Vision Baseline Vision/History: Wears glasses Wears Glasses: At all times Patient Visual Report: No change from baseline       Perception     Praxis      Pertinent Vitals/Pain Pain Assessment: Faces Faces Pain Scale: Hurts little more Pain Location: R shoulder with ROM Pain Descriptors / Indicators: Grimacing Pain Intervention(s): Monitored during session     Hand Dominance Right   Extremity/Trunk Assessment Upper Extremity Assessment Upper Extremity Assessment: RUE deficits/detail;LUE deficits/detail RUE Deficits / Details: pt with carpal tunnel vs peripheral neuropathy with impaired sensation RUE Sensation: history of peripheral neuropathy RUE Coordination: decreased fine motor LUE Deficits / Details: pt with hx of rotator cuff inflammation per pt (sees Dr. Erlinda Hong), hand also with carpal tunnel vs peripheral neuropathy, FF of shoulder to 60 degrees LUE: Unable to fully assess due to pain LUE Sensation: decreased light touch;history of peripheral neuropathy LUE Coordination: decreased fine motor;decreased gross motor   Lower Extremity Assessment Lower Extremity Assessment: Defer to PT evaluation  Cervical / Trunk Assessment Cervical / Trunk Assessment: Other exceptions Cervical / Trunk Exceptions: weakness and pain when standing upright at walker   Communication Communication Communication: No difficulties   Cognition Arousal/Alertness: Awake/alert Behavior During Therapy: WFL for tasks assessed/performed Overall Cognitive Status: Within Functional Limits for tasks assessed                                     General Comments       Exercises     Shoulder Instructions       Home Living Family/patient expects to be discharged to:: Private residence Living Arrangements: Spouse/significant other Available Help at Discharge: Family;Available 24 hours/day Type of Home: House Home Access: Ramped entrance     Home Layout: One level     Bathroom Shower/Tub: Teacher, early years/pre: Handicapped height     Home Equipment: Environmental consultant - 2 wheels;Wheelchair - power;Tub bench;Hand held shower head          Prior Functioning/Environment Level of Independence: Independent with assistive device(s)        Comments: walks in home with RW up to 25 ft, otherwise uses motorized w/c        OT Problem List: Decreased strength;Decreased activity tolerance;Impaired balance (sitting and/or standing);Decreased safety awareness;Decreased knowledge of use of DME or AE;Obesity;Pain;Cardiopulmonary status limiting activity      OT Treatment/Interventions: Self-care/ADL training;DME and/or AE instruction;Patient/family education;Energy conservation;Therapeutic activities    OT Goals(Current goals can be found in the care plan section) Acute Rehab OT Goals Patient Stated Goal: to return home OT Goal Formulation: With patient Time For Goal Achievement: 09/08/16 Potential to Achieve Goals: Good ADL Goals Additional ADL Goal #1: Pt will be knowledgeable in use of walker splints to assist with standing posture and safety with walker use. Additional ADL Goal #2: Pt will be knowledgeable in use of AE for LB bathing and dressing for energy conservation. Additional ADL Goal #3: Pt will state at least 3 energy conservation strategies as instructed.  OT Frequency: Min 2X/week   Barriers to D/C:            Co-evaluation              AM-PAC PT "6 Clicks" Daily Activity     Outcome Measure Help from another person eating meals?: None Help from another person taking care of personal grooming?: None Help from another person toileting, which includes using  toliet, bedpan, or urinal?: None Help from another person bathing (including washing, rinsing, drying)?: A Little Help from another person to put on and taking off regular upper body clothing?: None Help from another person to put on and taking off regular lower body clothing?: A Little 6 Click Score: 22   End of Session Equipment Utilized During Treatment: Rolling walker Nurse Communication: Mobility status  Activity Tolerance: Patient limited by fatigue (pt in afib, HR to 124) Patient left: Other (comment) (w/c)  OT Visit Diagnosis: Unsteadiness on feet (R26.81);Muscle weakness (generalized) (M62.81);Pain Pain - Right/Left: Left Pain - part of body: Shoulder                Time: 1130-1154 OT Time Calculation (min): 24 min Charges:  OT General Charges $OT Visit: 1 Procedure OT Evaluation $OT Eval Moderate Complexity: 1 Procedure G-Codes: OT G-codes **NOT FOR INPATIENT CLASS** Functional Assessment Tool Used: Clinical judgement Functional Limitation: Self care Self Care Current Status (Q6761): At least 1  percent but less than 20 percent impaired, limited or restricted Self Care Goal Status (H4388): At least 1 percent but less than 20 percent impaired, limited or restricted   Malka So 08/25/2016, 12:19 PM  [phone]

## 2016-08-25 NOTE — Progress Notes (Signed)
PROGRESS NOTE                                                                                                                                                                                                             Patient Demographics:    Brian Richmond, is a 61 y.o. male, DOB - 03-23-1955, YPP:[id number]  Admit date - 08/23/2016   Admitting Physician Ivor Costa, MD  Outpatient Primary MD for the patient is Hulan Fess, MD  LOS - 0  Outpatient Specialists: Dr. Earle Gell  Chief Complaint  Patient presents with  . Rectal Bleeding  . Weakness       Brief Narrative   61 year old morbidly obese male with history of hypertension, diabetes mellitus, hypothyroidism, depression, OSA on CPAP, restless leg syndrome, colon cancer in 2010 status post partial colonic resection with last repeat colonoscopy in 2015, BPH who presented with bright red blood per rectum, right leg numbness and generalized weakness. Patient reports having generalized weakness for past 4-6 weeks with associated twitching pain on his right thigh. He reports being frequently constipated and denies any urinary symptoms (except for urgency and hesitancy associated with BPH). Patient noticed bright red blood per rectum on 2-3 episodes since past 3 days. Informs having similar symptoms in the past which is associated with straining during bowel movement. The ED FOBT was positive. Hemoglobin of 12.3, normal WBC, d-dimer of 3.22. Remaining labs were stable. He was also found to be in A. fib with RVR. Chest x-ray was unremarkable. CT of the abdomen and pelvis showed stool burden without other acute findings. Placed on telemetry.   Subjective:   No further rectal bleed. Lower extremity pain and weakness better.  Assessment  & Plan :    Principal Problem:   New onset atrial fibrillation With RVR (HCC) Rate controlled on metoprolol twice a day. Serial troponins  negative. Pending 2-D echo. Mildly elevated TSH, increased Synthroid dose.Marland Kitchen Urine drug screen negative. Cardiology consult appreciated. Plan on anticoagulation  if flexible sigmoidoscopy normal only shows hemorrhoids/anal fissure and H&H stable.   Active Problems: Rectal bleed GI consult appreciated. Suspect this is hemorrhoidal bleed from his severe constipation. Plan on colonoscopy tomorrow. Hemoglobin currently stable. Monitor serial H&H and continue PPI Patient reports taking Advil (up to 800 mg a day) for past several weeks.   Positive d-dimer Asymptomatic.  Lower extremity Doppler negative for DVT.   Hypothyroidism TSH elevated to 4.41. Increased Synthroid dose.   Right leg numbness with? restless leg syndrome Ongoing for past several weeks.  improved with empiric pramipexole on admission Patient refused MRI of the lumbar spine stating he is very claustrophobic. PT/OT recommends no follow.  Constipation Patient reports taking MS Contin and Percocet for chronic low back pain. Continue MiraLAX and Senokot. Dulcolax suppository when necessary  BPH Continue Flomax and oxybutynin  OSA Continue nighttime CPAP  Diabetes mellitus type 2 with neuropathy Monitor on SSI.  (patient on Amaryl and metformin at home)  Essential hypertension Continue lisinopril.  Depression Stable. Continue Cymbalta.  Morbid obesity Reports he has lost almost 80 lbs in 1 year ( intentionally)   Code Status : Full code  Family Communication  : None at bedside  Disposition Plan  : Home once improved  Barriers For Discharge : Active symptoms  Consults  :   [facility]  Procedures  :  2-D echo CT abdomen and pelvis  DVT Prophylaxis  : SCDs   Lab Results  Component Value Date   PLT 236 08/24/2016    Antibiotics  :   Anti-infectives    None        Objective:   Vitals:   08/24/16 2115 08/25/16 0602 08/25/16 0800 08/25/16 1222  BP:  114/62 137/73 115/64  Pulse:  73  91 75  Resp: (!) 22 13    Temp:  98.3 F (36.8 C) 98 F (36.7 C) 98.2 F (36.8 C)  TempSrc:  Oral Oral Oral  SpO2:  100% 98% 96%  Weight:  (!) 197.6 kg (435 lb 11.2 oz)    Height:        Wt Readings from Last 3 Encounters:  08/25/16 (!) 197.6 kg (435 lb 11.2 oz)  10/13/14 (!) 213.6 kg (471 lb)  12/05/13 (!) 213.6 kg (471 lb)    No intake or output data in the 24 hours ending 08/25/16 1351   Physical Exam Gen.: Morbidly obese male not in distress HEENT: No pallor, moist mucosa, supple neck Chest: Clear bilaterally  CVS S1 and S2 irregular, no murmurs GI: Soft, nondistended, nontender Musculoskeletal: Warm, no edema, non-tender     Data Review:    CBC  Recent Labs Lab 08/23/16 1240 08/24/16 0317 08/24/16 1120 08/24/16 1849  WBC 8.9 9.4 8.7 8.6  HGB 12.3* 13.3 11.9* 12.3*  HCT 37.0* 39.4 35.9* 36.7*  PLT 291 290 292 236  MCV 92.0 91.2 92.8 92.2  MCH 30.6 30.8 30.7 30.9  MCHC 33.2 33.8 33.1 33.5  RDW 13.7 13.5 14.1 13.6    Chemistries   Recent Labs Lab 08/23/16 1240 08/23/16 2222 08/24/16 1120  NA 135  --  137  K 4.1  --  4.1  CL 103  --  103  CO2 20*  --  26  GLUCOSE 131*  --  112*  BUN 11  --  10  CREATININE 0.94  --  0.94  CALCIUM 9.0  --  9.2  MG  --  2.0  --    ------------------------------------------------------------------------------------------------------------------  Recent Labs  08/24/16 0317  CHOL 174  HDL 32*  LDLCALC 120*  TRIG 110  CHOLHDL 5.4    Lab Results  Component Value Date   HGBA1C 5.8 (H) 08/24/2016   ------------------------------------------------------------------------------------------------------------------  Recent Labs  08/23/16 2222  TSH 4.818*   ------------------------------------------------------------------------------------------------------------------ No results for input(s): VITAMINB12, FOLATE, FERRITIN, TIBC, IRON, RETICCTPCT in the last  72 hours.  Coagulation profile  Recent  Labs Lab 08/24/16 0317  INR 1.13     Recent Labs  08/23/16 2222  DDIMER 3.22*    Cardiac Enzymes  Recent Labs Lab 08/24/16 0317 08/24/16 1120  TROPONINI <0.03 <0.03   ------------------------------------------------------------------------------------------------------------------    Component Value Date/Time   BNP 129.9 (H) 08/24/2016 0317    Inpatient Medications  Scheduled Meds: . DULoxetine  30 mg Oral Daily  . gabapentin  100 mg Oral BID  . insulin aspart  0-5 Units Subcutaneous QHS  . insulin aspart  0-9 Units Subcutaneous TID WC  . levothyroxine  50 mcg Oral QAC breakfast  . lisinopril  10 mg Oral q morning - 10a  . metoprolol tartrate  25 mg Oral BID  . morphine  30 mg Oral Q8H  . oxybutynin  5 mg Oral TID  . pantoprazole (PROTONIX) IV  40 mg Intravenous Q12H  . polyethylene glycol  17 g Oral BID  . pramipexole  0.125 mg Oral QHS  . senna-docusate  1 tablet Oral BID  . tamsulosin  0.4 mg Oral Daily   Continuous Infusions: PRN Meds:.acetaminophen, bisacodyl, cyclobenzaprine, hydrALAZINE, ondansetron (ZOFRAN) IV, oxyCODONE-acetaminophen, pseudoephedrine, zolpidem  Micro Results Recent Results (from the past 240 hour(s))  MRSA PCR Screening     Status: None   Collection Time: 08/24/16  5:36 PM  Result Value Ref Range Status   MRSA by PCR NEGATIVE NEGATIVE Final    Comment:        The GeneXpert MRSA Assay (FDA approved for NASAL specimens only), is one component of a comprehensive MRSA colonization surveillance program. It is not intended to diagnose MRSA infection nor to guide or monitor treatment for MRSA infections.     Radiology Reports Dg Chest 2 View  Result Date: 08/23/2016 CLINICAL DATA:  Atrial fibrillation, weakness and rectal bleeding. EXAM: CHEST  2 VIEW COMPARISON:  None. FINDINGS: The heart size and mediastinal contours are within normal limits. There is no evidence of pulmonary edema, consolidation, pneumothorax, nodule or  pleural fluid. The visualized skeletal structures are unremarkable. IMPRESSION: No active cardiopulmonary disease. Electronically Signed   By: Aletta Edouard M.D.   On: 08/23/2016 22:06   Ct Abdomen Pelvis W Contrast  Result Date: 08/23/2016 CLINICAL DATA:  Abdominal pain query gastroenteritis or colitis. EXAM: CT ABDOMEN AND PELVIS WITH CONTRAST TECHNIQUE: Multidetector CT imaging of the abdomen and pelvis was performed using the standard protocol following bolus administration of intravenous contrast. CONTRAST:  170mL ISOVUE-300 IOPAMIDOL (ISOVUE-300) INJECTION 61% COMPARISON:  None. FINDINGS: Lower chest: Coronary arteriosclerosis. Heart is top-normal in size. No pericardial effusion. There is aortic atherosclerosis. Hepatobiliary: Hepatic steatosis. Calcification in the right hepatic lobe consistent with a granuloma. Mild distention of the gallbladder with probable biliary sludge noted within. No wall thickening or pericholecystic fluid. No biliary dilatation. Pancreas: Mild fatty atrophy without mass or ductal dilatation. Spleen: Normal without mass. 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 The kidneys, ureters and bladder are nonacute. Stomach/Bowel: Contracted stomach. Normal small bowel rotation without inflammation or distention. No obstruction. Normal appendix. A moderate degree of fecal residue is noted within the cecum and ascending colon as well as distal descending colon through rectum. Vascular/Lymphatic: Aortoiliac atherosclerosis without aneurysm. No adenopathy. Reproductive: Normal size prostate. Other: Ventral midline scarring. Small fat containing umbilical hernia. Musculoskeletal: Thoracolumbar spondylosis. No acute osseous abnormality. IMPRESSION: 1. Moderate amount of fecal residue within large bowel without inflammation. Query constipation. 2. Coronary arteriosclerosis. 3. Hepatic  steatosis with right hepatic granuloma.  Biliary sludge noted within the gallbladder without evidence of acute cholecystitis. 4. Aortoiliac atherosclerosis. 5. Thoracolumbar spondylosis. Electronically Signed   By: Ashley Royalty M.D.   On: 08/23/2016 21:58    Time Spent in minutes  20   Louellen Molder M.D on 08/25/2016 at 1:51 PM  Between 7am to 7pm - Pager - [phone]  After 7pm go to [email or web address] - password [facility]  [facility] -  Office  [phone]

## 2016-08-25 NOTE — Plan of Care (Signed)
Problem: Activity: Goal: Ability to tolerate increased activity will improve Outcome: Progressing Pt tolerates ambulating with walker. Pt HR continues to be elevated during ambulation.

## 2016-08-25 NOTE — Care Management Note (Addendum)
Case Management Note  Patient Details  Name: Brian Richmond MRN: [id number] Date of Birth: January 03, 1956  Subjective/Objective:   afib with RVR, rectal bleeding, positive d-dimer                 Action/Plan: Discharge Planning: NCM spoke to pt at bedside. Wife at home to assist as needed. Has ramp, power wheelchair, RW, van with lift, and CPAP machine at home. Pt states he drives himself to his appts. He is working on getting another power wheelchair. Having issues with his w/c. Provided pt with [facility] list and offered choice for HH.  Will need HH RN/PT orders with [facility] if [facility] needed. Pt has an aide that comes once per week to give bath. Will continue to follow for dc needs.  Pt requested [facility] for HH. Contacted rep with new referral.   PCP- Hulan Fess MD   Expected Discharge Date:               Expected Discharge Plan:  [place]  In-House Referral:  NA  Discharge planning Services  CM Consult  Post Acute Care Choice:  Home Health Choice offered to:  Patient  DME Arranged:  N/A DME Agency:  NA  HH Arranged:    [place] Agency:     Status of Service:  In process, will continue to follow  If discussed at Long Length of Stay Meetings, dates discussed:    Additional Comments:  Erenest Rasher, RN 08/25/2016, 10:58 AM

## 2016-08-25 NOTE — Progress Notes (Signed)
Progress Note  Patient Name: Brian Richmond Date of Encounter: 08/25/2016  Primary Cardiologist: new  Subjective   No new hematochezia. Denies shortness of breath or angina, dizziness or palpitations. Excellent ventricular rate control with heart rates in the 80s on a low dose of beta blocker. Note plans for sigmoidoscopy in the morning. CT shows what I would appreciate to be a relatively mild amount of coronary atherosclerotic calcification, there is very little atherosclerotic calcification in the visible parts of the aorta.  Inpatient Medications    Scheduled Meds: . DULoxetine  30 mg Oral Daily  . gabapentin  100 mg Oral BID  . insulin aspart  0-5 Units Subcutaneous QHS  . insulin aspart  0-9 Units Subcutaneous TID WC  . levothyroxine  50 mcg Oral QAC breakfast  . lisinopril  10 mg Oral q morning - 10a  . metoprolol tartrate  25 mg Oral BID  . morphine  30 mg Oral Q8H  . oxybutynin  5 mg Oral TID  . pantoprazole (PROTONIX) IV  40 mg Intravenous Q12H  . polyethylene glycol  17 g Oral BID  . pramipexole  0.125 mg Oral QHS  . senna-docusate  1 tablet Oral BID  . tamsulosin  0.4 mg Oral Daily   Continuous Infusions:  PRN Meds: acetaminophen, bisacodyl, cyclobenzaprine, hydrALAZINE, ondansetron (ZOFRAN) IV, oxyCODONE-acetaminophen, pseudoephedrine, zolpidem   Vital Signs    Vitals:   08/24/16 2115 08/25/16 0602 08/25/16 0800 08/25/16 1222  BP:  114/62 137/73 115/64  Pulse:  73 91 75  Resp: (!) 22 13    Temp:  98.3 F (36.8 C) 98 F (36.7 C) 98.2 F (36.8 C)  TempSrc:  Oral Oral Oral  SpO2:  100% 98% 96%  Weight:  (!) 435 lb 11.2 oz (197.6 kg)    Height:       No intake or output data in the 24 hours ending 08/25/16 1323 Filed Weights   08/23/16 1227 08/25/16 0602  Weight: (!) 471 lb (213.6 kg) (!) 435 lb 11.2 oz (197.6 kg)    Telemetry    Atrial fibrillation with controlled ventricular rate - Personally Reviewed  ECG    No new tracing - Personally  Reviewed  Physical Exam  Comfortable, relaxed. Super obesity limits his cardiovascular exam. GEN: No acute distress.   Neck: No JVD Cardiac:  irregular rhythm, distant heart sounds, no murmurs, rubs, or gallops.  Respiratory: Clear to auscultation bilaterally. GI: Soft, nontender, bowel sounds are present MS: No edema; No deformity. Neuro:  Nonfocal  Psych: Normal affect   Labs    Chemistry Recent Labs Lab 08/23/16 1240 08/24/16 1120  NA 135 137  K 4.1 4.1  CL 103 103  CO2 20* 26  GLUCOSE 131* 112*  BUN 11 10  CREATININE 0.94 0.94  CALCIUM 9.0 9.2  GFRNONAA >60 >60  GFRAA >60 >60  ANIONGAP 12 8     Hematology Recent Labs Lab 08/24/16 0317 08/24/16 1120 08/24/16 1849  WBC 9.4 8.7 8.6  RBC 4.32 3.87* 3.98*  HGB 13.3 11.9* 12.3*  HCT 39.4 35.9* 36.7*  MCV 91.2 92.8 92.2  MCH 30.8 30.7 30.9  MCHC 33.8 33.1 33.5  RDW 13.5 14.1 13.6  PLT 290 292 236    Cardiac Enzymes Recent Labs Lab 08/24/16 0317 08/24/16 1120  TROPONINI <0.03 <0.03    Recent Labs Lab 08/23/16 2118  TROPIPOC 0.00     BNP Recent Labs Lab 08/24/16 0317  BNP 129.9*     DDimer  Recent Labs Lab 08/23/16 2222  DDIMER 3.22*     Radiology    Dg Chest 2 View  Result Date: 08/23/2016 CLINICAL DATA:  Atrial fibrillation, weakness and rectal bleeding. EXAM: CHEST  2 VIEW COMPARISON:  None. FINDINGS: The heart size and mediastinal contours are within normal limits. There is no evidence of pulmonary edema, consolidation, pneumothorax, nodule or pleural fluid. The visualized skeletal structures are unremarkable. IMPRESSION: No active cardiopulmonary disease. Electronically Signed   By: Aletta Edouard M.D.   On: 08/23/2016 22:06   Ct Abdomen Pelvis W Contrast  Result Date: 08/23/2016 CLINICAL DATA:  Abdominal pain query gastroenteritis or colitis. EXAM: CT ABDOMEN AND PELVIS WITH CONTRAST TECHNIQUE: Multidetector CT imaging of the abdomen and pelvis was performed using the standard  protocol following bolus administration of intravenous contrast. CONTRAST:  140mL ISOVUE-300 IOPAMIDOL (ISOVUE-300) INJECTION 61% COMPARISON:  None. FINDINGS: Lower chest: Coronary arteriosclerosis. Heart is top-normal in size. No pericardial effusion. There is aortic atherosclerosis. Hepatobiliary: Hepatic steatosis. Calcification in the right hepatic lobe consistent with a granuloma. Mild distention of the gallbladder with probable biliary sludge noted within. No wall thickening or pericholecystic fluid. No biliary dilatation. Pancreas: Mild fatty atrophy without mass or ductal dilatation. Spleen: Normal without mass. 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 The kidneys, ureters and bladder are nonacute. Stomach/Bowel: Contracted stomach. Normal small bowel rotation without inflammation or distention. No obstruction. Normal appendix. A moderate degree of fecal residue is noted within the cecum and ascending colon as well as distal descending colon through rectum. Vascular/Lymphatic: Aortoiliac atherosclerosis without aneurysm. No adenopathy. Reproductive: Normal size prostate. Other: Ventral midline scarring. Small fat containing umbilical hernia. Musculoskeletal: Thoracolumbar spondylosis. No acute osseous abnormality. IMPRESSION: 1. Moderate amount of fecal residue within large bowel without inflammation. Query constipation. 2. Coronary arteriosclerosis. 3. Hepatic steatosis with right hepatic granuloma. Biliary sludge noted within the gallbladder without evidence of acute cholecystitis. 4. Aortoiliac atherosclerosis. 5. Thoracolumbar spondylosis. Electronically Signed   By: Ashley Royalty M.D.   On: 08/23/2016 21:58    Cardiac Studies   Echo pending  Patient Profile     61 y.o. superobese male with newly diagnosed persistent atrial fibrillation, presenting with hematochezia. Additional problems include hypertension, diabetes mellitus,  hypothyroidism, obstructive sleep apnea compliant with CPAP, colon cancer status post partial colectomy.  Assessment & Plan    1. AFib: Rate control has been very easily achieved and the arrhythmia appears to be asymptomatic at this point. He does not have evidence of heart failure. We have not to start anticoagulation until he has his GI evaluation tomorrow. If he simply has hemorrhoids or an anal fissure, anticoagulants should be started to reduce the risk of embolic stroke. I don't think this patient will be a good candidate for a rhythm control strategy. We will be able to further refine this estimation based on his echo findings (particularly based on right and left atrial size and left ventricular function), but in my opinion cardioversion and antiarrhythmic therapy would be futile and risky. I think we should continue with a lifelong rate control and anticoagulation strategy. 2. Coronary Ca: There are no good noninvasive procedures that could be used to evaluate this patient for coronary insufficiency. Nuclear stress testing or coronary CT angiography will not have the resolution necessary for accuracy in the setting of such severe obesity. The only reasonable approach look for coronary stenoses with the invasive coronary angiography, which I do not think is in any way justified at  this point. He does not have angina pectoris or clinical heart failure. If his echocardiogram shows depressed left ventricular systolic function, especially if there are regional wall motion abnormalities, we may have to consider coronary angiography, but this can be safely delayed until all bleeding issues are related to rest. 3. Superobesity: He reports that as recently as 12 months ago he weighed 516 pounds. If today's weight of 435 pounds is accurate, that means he has lost 80 pounds. He is congratulated, but he still has a tremendous amount of extra weight to lose. 4. Obstructive sleep apnea compliant with  CPAP  Signed, Sanda Klein, MD  08/25/2016, 1:23 PM

## 2016-08-26 ENCOUNTER — Encounter (HOSPITAL_COMMUNITY): Payer: Self-pay | Admitting: *Deleted

## 2016-08-26 ENCOUNTER — Observation Stay (HOSPITAL_COMMUNITY): Payer: Medicare HMO | Admitting: Certified Registered Nurse Anesthetist

## 2016-08-26 ENCOUNTER — Encounter (HOSPITAL_COMMUNITY)
Admission: EM | Disposition: A | Discharge status: Home / Self Care | Payer: Self-pay | Source: Home / Self Care | Attending: Emergency Medicine

## 2016-08-26 DIAGNOSIS — K648 Other hemorrhoids: Secondary | ICD-10-CM | POA: Diagnosis not present

## 2016-08-26 DIAGNOSIS — K625 Hemorrhage of anus and rectum: Secondary | ICD-10-CM | POA: Diagnosis not present

## 2016-08-26 DIAGNOSIS — I4891 Unspecified atrial fibrillation: Secondary | ICD-10-CM | POA: Diagnosis not present

## 2016-08-26 DIAGNOSIS — K5903 Drug induced constipation: Secondary | ICD-10-CM | POA: Diagnosis not present

## 2016-08-26 DIAGNOSIS — I480 Paroxysmal atrial fibrillation: Secondary | ICD-10-CM | POA: Diagnosis not present

## 2016-08-26 DIAGNOSIS — E038 Other specified hypothyroidism: Secondary | ICD-10-CM | POA: Diagnosis not present

## 2016-08-26 DIAGNOSIS — K5909 Other constipation: Secondary | ICD-10-CM | POA: Diagnosis not present

## 2016-08-26 DIAGNOSIS — R531 Weakness: Secondary | ICD-10-CM | POA: Diagnosis not present

## 2016-08-26 DIAGNOSIS — G2581 Restless legs syndrome: Secondary | ICD-10-CM | POA: Diagnosis not present

## 2016-08-26 DIAGNOSIS — K921 Melena: Secondary | ICD-10-CM | POA: Diagnosis not present

## 2016-08-26 HISTORY — PX: FLEXIBLE SIGMOIDOSCOPY: SHX5431

## 2016-08-26 LAB — BASIC METABOLIC PANEL
Anion gap: 12 (ref 5–15)
BUN: 15 mg/dL (ref 6–20)
CALCIUM: 9.1 mg/dL (ref 8.9–10.3)
CHLORIDE: 102 mmol/L (ref 101–111)
CO2: 24 mmol/L (ref 22–32)
CREATININE: 1.11 mg/dL (ref 0.61–1.24)
GFR calc Af Amer: >60 mL/min (ref >60)
GFR calc non Af Amer: >60 mL/min (ref >60)
Glucose, Bld: 135 mg/dL — ABNORMAL HIGH (ref 65–99)
Potassium: 4.2 mmol/L (ref 3.5–5.1)
SODIUM: 138 mmol/L (ref 135–145)

## 2016-08-26 LAB — GLUCOSE, CAPILLARY
GLUCOSE-CAPILLARY: 137 mg/dL — AB (ref 65–99)
Glucose-Capillary: 109 mg/dL — ABNORMAL HIGH (ref 65–99)
Glucose-Capillary: 125 mg/dL — ABNORMAL HIGH (ref 65–99)
Glucose-Capillary: 134 mg/dL — ABNORMAL HIGH (ref 65–99)

## 2016-08-26 LAB — CBC
HEMATOCRIT: 38.2 % — AB (ref 39.0–52.0)
HEMOGLOBIN: 12.9 g/dL — AB (ref 13.0–17.0)
MCH: 30.9 pg (ref 26.0–34.0)
MCHC: 33.8 g/dL (ref 30.0–36.0)
MCV: 91.6 fL (ref 78.0–100.0)
Platelets: 287 10*3/uL (ref 150–400)
RBC: 4.17 MIL/uL — ABNORMAL LOW (ref 4.22–5.81)
RDW: 13.7 % (ref 11.5–15.5)
WBC: 8.9 10*3/uL (ref 4.0–10.5)

## 2016-08-26 SURGERY — SIGMOIDOSCOPY, FLEXIBLE

## 2016-08-26 MED ORDER — HYDROCORTISONE ACETATE 25 MG RE SUPP
25.0000 mg | Freq: Two times a day (BID) | RECTAL | Status: DC
  Administered 2016-08-26 – 2016-08-28 (×4): 25 mg via RECTAL
  Filled 2016-08-26 (×4): qty 1

## 2016-08-26 MED ORDER — SODIUM CHLORIDE 0.9 % IV SOLN: INTRAVENOUS | Status: DC

## 2016-08-26 NOTE — Progress Notes (Signed)
Subjective:  Awaiting results of GI evaluation.  Has been prepped.  No complaints of shortness of breath or chest pain.  Objective:  Vital Signs in the last 24 hours: BP 104/79 (BP Location: Left Arm)   Pulse 98   Temp 98 F (36.7 C) (Oral)   Resp 12   Ht 5\' 9"  (1.753 m)   Wt (!) 196.6 kg (433 lb 6.4 oz)   SpO2 97%   BMI 64.00 kg/m   Physical Exam: Severely obese white male in no acute distress Lungs:  Clear  Cardiac:  Irregular rhythm, normal S1 and S2, no S3 Extremities:  1+ edema present  Intake/Output from previous day: 08/10 0701 - 08/11 0700 In: 720 [P.O.:720] Out: -  Weight Filed Weights   08/23/16 1227 08/25/16 0602 08/26/16 0426  Weight: (!) 213.6 kg (471 lb) (!) 197.6 kg (435 lb 11.2 oz) (!) 196.6 kg (433 lb 6.4 oz)    Lab Results: Basic Metabolic Panel:  Recent Labs  08/24/16 1120 08/26/16 0308  NA 137 138  K 4.1 4.2  CL 103 102  CO2 26 24  GLUCOSE 112* 135*  BUN 10 15  CREATININE 0.94 1.11    CBC:  Recent Labs  08/24/16 1849 08/26/16 0308  WBC 8.6 8.9  HGB 12.3* 12.9*  HCT 36.7* 38.2*  MCV 92.2 91.6  PLT 236 287    BNP    Component Value Date/Time   BNP 129.9 (H) 08/24/2016 0317    Cardiac Panel (last 3 results)  Recent Labs  08/24/16 0317 08/24/16 1120  TROPONINI <0.03 <0.03   Telemetry: Atrial fibrillation with controlled response  Assessment/Plan:  1.  Persistent atrial fibrillation controlled response not good candidate for rhythm control 2.  Severe super obesity 3.  Recent GI bleeding 4.  Sleep apnea 5.  Coronary calcification suggestive of CAD  Recommendations:  Awaiting results of GI evaluation to determine initiation of anticoagulation.  He appears to be rate controlled at the present time.     Kerry Hough  MD [facility] Cardiology  08/26/2016, 11:22 AM

## 2016-08-26 NOTE — Progress Notes (Signed)
PROGRESS NOTE                                                                                                                                                                                                             Patient Demographics:    Brian Richmond, is a 61 y.o. male, DOB - [birth year], DQQ:[id number]  Admit date - 08/23/2016   Admitting Physician Ivor Costa, MD  Outpatient Primary MD for the patient is Hulan Fess, MD  LOS - 0  Outpatient Specialists: Dr. Earle Gell  Chief Complaint  Patient presents with  . Rectal Bleeding  . Weakness       Brief Narrative   61 year old morbidly obese male with history of hypertension, diabetes mellitus, hypothyroidism, depression, OSA on CPAP, restless leg syndrome, colon cancer in 2010 status post partial colonic resection with last repeat colonoscopy in 2015, BPH who presented with bright red blood per rectum, right leg numbness and generalized weakness. Patient reports having generalized weakness for past 4-6 weeks with associated twitching pain on his right thigh. He reports being frequently constipated and denies any urinary symptoms (except for urgency and hesitancy associated with BPH). Patient noticed bright red blood per rectum on 2-3 episodes since past 3 days. Informs having similar symptoms in the past which is associated with straining during bowel movement. The ED FOBT was positive. Hemoglobin of 12.3, normal WBC, d-dimer of 3.22. Remaining labs were stable. He was also found to be in A. fib with RVR. Chest x-ray was unremarkable. CT of the abdomen and pelvis showed stool burden without other acute findings. Placed on telemetry.   Subjective:   Denies further rectal bleed. Noted for episodes of heart rate elevated to 140s this morning.  Assessment  & Plan :    Principal Problem:   New onset atrial fibrillation With RVR (HCC) Heart rate elevated this morning,  did not get his morning dose of metoprolol. (Instructed the nurse to give his morning dose). 2-D echo with normal EF and no wall motion abnormality (limited study due to his body habitus). TSH mildly elevated, Synthroid dose adjusted. Cardiology plan on starting anticoagulation a flexible sigmoidoscopy normal or only shows hemorrhoid/anal fissure. H&H remain stable.    Active Problems: Rectal bleed GI consult appreciated. Suspect this is hemorrhoidal bleed from his severe constipation. Sigmoidoscopy today. Hemoglobin currently stable. Continue  PPI. Also reports taking Advil up to 800 mg daily for his chronic pain for several weeks.  Positive d-dimer Asymptomatic. Lower extremity Doppler negative for DVT.   Hypothyroidism TSH elevated to 4.41. Increased Synthroid dose.   Right leg numbness with? restless leg syndrome Ongoing for past several weeks.  improved with empiric pramipexole on admission Patient refused MRI of the lumbar spine stating he is very claustrophobic. PT/OT recommends no follow up. Symptoms better.  Constipation Patient reports taking MS Contin and Percocet for chronic low back pain. Continue MiraLAX and Senokot. Dulcolax suppository when necessary 88 getting bowel prep for sigmoidoscopy.  BPH Continue Flomax and oxybutynin  OSA Continue nighttime CPAP  Diabetes mellitus type 2 with neuropathy Monitor on SSI.  (patient on Amaryl and metformin at home)  Essential hypertension Continue lisinopril.  Depression Stable. Continue Cymbalta.  Morbid obesity Reports he has lost almost 80 lbs in 1 year ( intentionally)   Code Status : Full code  Family Communication  : None at bedside  Disposition Plan  : Home once improved  Barriers For Discharge : Active symptoms  Consults  :   [facility]  Procedures  :  2-D echo CT abdomen and pelvis  DVT Prophylaxis  : SCDs   Lab Results  Component Value Date   PLT 287 08/26/2016    Antibiotics   :   Anti-infectives    None        Objective:   Vitals:   08/25/16 1222 08/25/16 1656 08/25/16 2154 08/26/16 0426  BP: 115/64 120/77 107/70 104/79  Pulse: 75 76 84 98  Resp:   20 12  Temp: 98.2 F (36.8 C) 97.9 F (36.6 C) 98.1 F (36.7 C) 98 F (36.7 C)  TempSrc: Oral Oral Oral Oral  SpO2: 96% 99% 99% 97%  Weight:    (!) 196.6 kg (433 lb 6.4 oz)  Height:        Wt Readings from Last 3 Encounters:  08/26/16 (!) 196.6 kg (433 lb 6.4 oz)  10/13/14 (!) 213.6 kg (471 lb)  12/05/13 (!) 213.6 kg (471 lb)     Intake/Output Summary (Last 24 hours) at 08/26/16 1226 Last data filed at 08/26/16 0415  Gross per 24 hour  Intake              240 ml  Output                0 ml  Net              240 ml     Physical Exam General: Morbidly obese male not in distress HEENT: Moist mucosa, supple neck Chest: Clear bilaterally  CVS: S1 and S2 irregular, no murmurs GI: Soft, nondistended, nontender Musculus filter: Warm, no edema       Data Review:    CBC  Recent Labs Lab 08/23/16 1240 08/24/16 0317 08/24/16 1120 08/24/16 1849 08/26/16 0308  WBC 8.9 9.4 8.7 8.6 8.9  HGB 12.3* 13.3 11.9* 12.3* 12.9*  HCT 37.0* 39.4 35.9* 36.7* 38.2*  PLT 291 290 292 236 287  MCV 92.0 91.2 92.8 92.2 91.6  MCH 30.6 30.8 30.7 30.9 30.9  MCHC 33.2 33.8 33.1 33.5 33.8  RDW 13.7 13.5 14.1 13.6 13.7    Chemistries   Recent Labs Lab 08/23/16 1240 08/23/16 2222 08/24/16 1120 08/26/16 0308  NA 135  --  137 138  K 4.1  --  4.1 4.2  CL 103  --  103 102  CO2 20*  --  26 24  GLUCOSE 131*  --  112* 135*  BUN 11  --  10 15  CREATININE 0.94  --  0.94 1.11  CALCIUM 9.0  --  9.2 9.1  MG  --  2.0  --   --    ------------------------------------------------------------------------------------------------------------------  Recent Labs  08/24/16 0317  CHOL 174  HDL 32*  LDLCALC 120*  TRIG 110  CHOLHDL 5.4    Lab Results  Component Value Date   HGBA1C 5.8 (H) 08/24/2016    ------------------------------------------------------------------------------------------------------------------  Recent Labs  08/23/16 2222  TSH 4.818*   ------------------------------------------------------------------------------------------------------------------ No results for input(s): VITAMINB12, FOLATE, FERRITIN, TIBC, IRON, RETICCTPCT in the last 72 hours.  Coagulation profile  Recent Labs Lab 08/24/16 0317  INR 1.13     Recent Labs  08/23/16 2222  DDIMER 3.22*    Cardiac Enzymes  Recent Labs Lab 08/24/16 0317 08/24/16 1120  TROPONINI <0.03 <0.03   ------------------------------------------------------------------------------------------------------------------    Component Value Date/Time   BNP 129.9 (H) 08/24/2016 0317    Inpatient Medications  Scheduled Meds: . DULoxetine  30 mg Oral Daily  . gabapentin  100 mg Oral BID  . insulin aspart  0-5 Units Subcutaneous QHS  . insulin aspart  0-9 Units Subcutaneous TID WC  . levothyroxine  50 mcg Oral QAC breakfast  . lisinopril  10 mg Oral q morning - 10a  . metoprolol tartrate  25 mg Oral BID  . morphine  30 mg Oral Q8H  . oxybutynin  5 mg Oral TID  . pantoprazole (PROTONIX) IV  40 mg Intravenous Q12H  . polyethylene glycol  17 g Oral BID  . pramipexole  0.125 mg Oral QHS  . senna-docusate  1 tablet Oral BID  . tamsulosin  0.4 mg Oral Daily   Continuous Infusions: . sodium chloride     PRN Meds:.acetaminophen, bisacodyl, cyclobenzaprine, hydrALAZINE, ondansetron (ZOFRAN) IV, oxyCODONE-acetaminophen, pseudoephedrine, zolpidem  Micro Results Recent Results (from the past 240 hour(s))  MRSA PCR Screening     Status: None   Collection Time: 08/24/16  5:36 PM  Result Value Ref Range Status   MRSA by PCR NEGATIVE NEGATIVE Final    Comment:        The GeneXpert MRSA Assay (FDA approved for NASAL specimens only), is one component of a comprehensive MRSA colonization surveillance program.  It is not intended to diagnose MRSA infection nor to guide or monitor treatment for MRSA infections.     Radiology Reports Dg Chest 2 View  Result Date: 08/23/2016 CLINICAL DATA:  Atrial fibrillation, weakness and rectal bleeding. EXAM: CHEST  2 VIEW COMPARISON:  None. FINDINGS: The heart size and mediastinal contours are within normal limits. There is no evidence of pulmonary edema, consolidation, pneumothorax, nodule or pleural fluid. The visualized skeletal structures are unremarkable. IMPRESSION: No active cardiopulmonary disease. Electronically Signed   By: Aletta Edouard M.D.   On: 08/23/2016 22:06   Ct Abdomen Pelvis W Contrast  Result Date: 08/23/2016 CLINICAL DATA:  Abdominal pain query gastroenteritis or colitis. EXAM: CT ABDOMEN AND PELVIS WITH CONTRAST TECHNIQUE: Multidetector CT imaging of the abdomen and pelvis was performed using the standard protocol following bolus administration of intravenous contrast. CONTRAST:  132mL ISOVUE-300 IOPAMIDOL (ISOVUE-300) INJECTION 61% COMPARISON:  None. FINDINGS: Lower chest: Coronary arteriosclerosis. Heart is top-normal in size. No pericardial effusion. There is aortic atherosclerosis. Hepatobiliary: Hepatic steatosis. Calcification in the right hepatic lobe consistent with a granuloma. Mild distention of the gallbladder with probable biliary sludge noted  within. No wall thickening or pericholecystic fluid. No biliary dilatation. Pancreas: Mild fatty atrophy without mass or ductal dilatation. Spleen: Normal without mass. 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 The kidneys, ureters and bladder are nonacute. Stomach/Bowel: Contracted stomach. Normal small bowel rotation without inflammation or distention. No obstruction. Normal appendix. A moderate degree of fecal residue is noted within the cecum and ascending colon as well as distal descending colon through rectum.  Vascular/Lymphatic: Aortoiliac atherosclerosis without aneurysm. No adenopathy. Reproductive: Normal size prostate. Other: Ventral midline scarring. Small fat containing umbilical hernia. Musculoskeletal: Thoracolumbar spondylosis. No acute osseous abnormality. IMPRESSION: 1. Moderate amount of fecal residue within large bowel without inflammation. Query constipation. 2. Coronary arteriosclerosis. 3. Hepatic steatosis with right hepatic granuloma. Biliary sludge noted within the gallbladder without evidence of acute cholecystitis. 4. Aortoiliac atherosclerosis. 5. Thoracolumbar spondylosis. Electronically Signed   By: Ashley Royalty M.D.   On: 08/23/2016 21:58    Time Spent in minutes  25   Louellen Molder M.D on 08/26/2016 at 12:26 PM  Between 7am to 7pm - Pager - [phone]  After 7pm go to [email or web address] - password [facility]  [facility] -  Office  [phone]

## 2016-08-26 NOTE — Progress Notes (Addendum)
[facility] Progress Note  Brian Richmond 61 y.o. 07-08-1955  CC:  Rectal bleeding   Subjective: Patient had anemia at around 9 AM. Had the one large bowel movement which was brown in color. There was no frank blood. She denied abdominal pain. Denied nausea or vomiting    Objective: Vital signs in last 24 hours: Vitals:   08/25/16 2154 08/26/16 0426  BP: 107/70 104/79  Pulse: 84 98  Resp: 20 12  Temp: 98.1 F (36.7 C) 98 F (36.7 C)  SpO2: 99% 97%    Physical Exam:  General:  Morbidly obese patient lying in the bed comfortably.   Head:  Normocephalic, without obvious abnormality, atraumatic  Eyes:  , EOM's intact,   Lungs:   Clear to auscultation bilaterally, respirations unlabored  Heart:  Irregularly irregular heart rate, no murmur   Abdomen:   Soft, non-tender, bowel sounds active all four quadrants,  no masses,   Extremities: Extremities normal, atraumatic, no  edema       Lab Results:  Recent Labs  08/23/16 2222 08/24/16 1120 08/26/16 0308  NA  --  137 138  K  --  4.1 4.2  CL  --  103 102  CO2  --  26 24  GLUCOSE  --  112* 135*  BUN  --  10 15  CREATININE  --  0.94 1.11  CALCIUM  --  9.2 9.1  MG 2.0  --   --    No results for input(s): AST, ALT, ALKPHOS, BILITOT, PROT, ALBUMIN in the last 72 hours.  Recent Labs  08/24/16 1849 08/26/16 0308  WBC 8.6 8.9  HGB 12.3* 12.9*  HCT 36.7* 38.2*  MCV 92.2 91.6  PLT 236 287    Recent Labs  08/24/16 0317  LABPROT 14.5  INR 1.13      Assessment/Plan: - Rectal bleeding. Resolved. Hemoglobin actually improved. - History of sigmoid colon cancer status post sigmoid colectomy in 2010. Last colonoscopy in 09/2014  was unremarkable. Repeat was recommended in 3 years - Atrial fibrillation. Currently rate controlled - Morbid obesity  Recommendations ----------------------- -  flexible sigmoidoscopy today . Risk benefits and alternatives were discussed with the patient. Verbalized  understanding - Keep nothing by mouth for now. - GI will follow   Otis Brace MD, [place] 08/26/2016, 11:12 AM  Pager [phone]  If no answer or after 5 PM call [phone]

## 2016-08-26 NOTE — Brief Op Note (Signed)
08/23/2016 - 08/26/2016  1:15 PM  PATIENT:  Brian Richmond  61 y.o. male  PRE-OPERATIVE DIAGNOSIS:  rectal bleeding  POST-OPERATIVE DIAGNOSIS:  unsedated, external hemerroid  PROCEDURE:  Procedure(s): FLEXIBLE SIGMOIDOSCOPY (N/A)  SURGEON:  Surgeon(s) and Role:    * Hosam Mcfetridge, MD - Primary  Findings/recommendations -------------------------------------- - Unsedated flexible sigmoidoscopy showed poor prep , External and internal hemorrhoid  But no evidence of active bleeding. - Most likely bleeding sources is hemorrhoids. - start Anusol suppositories. - ok to  start anticoagulation if needed from GI standpoint. - GI will sign off. Call us back if needed. - Follow-up in GI clinic in 8 weeks after discharge   Otis Brace MD, [place] 08/26/2016, 1:18 PM  Pager [phone]  If no answer or after 5 PM call [phone]

## 2016-08-26 NOTE — Op Note (Signed)
[facility] Patient Name: Brian Richmond Procedure Date : 08/26/2016 MRN: [id number] Attending MD: Otis Brace , MD Date of Birth: 05/20/1955 CSN: [id number] Age: 61 Admit Type: Inpatient Procedure:                Flexible Sigmoidoscopy Indications:              Rectal hemorrhage Providers:                Otis Brace, MD, Elna Breslow, RN, Corliss Parish, Technician Referring MD:              Medicines:                No sedation medications were administered Complications:            No immediate complications. Estimated Blood Loss:     Estimated blood loss: none. Procedure:                Pre-Anesthesia Assessment:                           - Prior to the procedure, a History and Physical                            was performed, and patient medications and                            allergies were reviewed. The patient's tolerance of                            previous anesthesia was also reviewed. The risks                            and benefits of the procedure and the sedation                            options and risks were discussed with the patient.                            All questions were answered, and informed consent                            was obtained. Prior Anticoagulants: The patient has                            taken no previous anticoagulant or antiplatelet                            agents. ASA Grade Assessment: III - A patient with                            severe systemic disease. After reviewing the risks  and benefits, the patient was deemed in                            satisfactory condition to undergo the procedure.                           After obtaining informed consent, the scope was                            passed under direct vision. The Colonoscope was                            introduced through the anus and advanced to the 40                            cm  from the anal verge. The flexible sigmoidoscopy                            was technically difficult and complex due to poor                            bowel prep with stool present. The quality of the                            bowel preparation was poor. Scope In: Scope Out: Findings:      The perianal exam findings include hemorrhoids. Hemorrhoid was small in       size but appeared purple in color which could be the cause for patient's       bleeding.      A moderate amount of semi-solid solid stool was found in the rectum, in       the recto-sigmoid colon and in the sigmoid colon, interfering with       visualization.      There is no endoscopic evidence of bleeding at 40 cm Prox to Anus.      Internal hemorrhoids were found during retroflexion. The hemorrhoids       were small. Impression:               - Preparation of the colon was poor.                           - Hemorrhoids found on perianal exam.                           - Stool in the rectum, in the recto-sigmoid colon                            and in the sigmoid colon.                           - Internal hemorrhoids.                           - No specimens collected. Recommendation:           - Return  patient to hospital ward for ongoing care. Procedure Code(s):        --- Professional ---                           [phone], Sigmoidoscopy, flexible; diagnostic,                            including collection of specimen(s) by brushing or                            washing, when performed (separate procedure) Diagnosis Code(s):        --- Professional ---                           K64.8, Other hemorrhoids                           K62.5, Hemorrhage of anus and rectum CPT copyright 2016 American Medical Association. All rights reserved. The codes documented in this report are preliminary and upon coder review may  be revised to meet current compliance requirements. Otis Brace, MD Otis Brace, MD 08/26/2016  1:14:30 PM Number of Addenda: 0

## 2016-08-27 ENCOUNTER — Encounter (HOSPITAL_COMMUNITY): Payer: Self-pay | Admitting: Gastroenterology

## 2016-08-27 DIAGNOSIS — R531 Weakness: Secondary | ICD-10-CM | POA: Diagnosis not present

## 2016-08-27 DIAGNOSIS — G2581 Restless legs syndrome: Secondary | ICD-10-CM | POA: Diagnosis not present

## 2016-08-27 DIAGNOSIS — K648 Other hemorrhoids: Secondary | ICD-10-CM | POA: Diagnosis not present

## 2016-08-27 DIAGNOSIS — I4891 Unspecified atrial fibrillation: Secondary | ICD-10-CM | POA: Diagnosis not present

## 2016-08-27 DIAGNOSIS — K625 Hemorrhage of anus and rectum: Secondary | ICD-10-CM | POA: Diagnosis not present

## 2016-08-27 DIAGNOSIS — E038 Other specified hypothyroidism: Secondary | ICD-10-CM | POA: Diagnosis not present

## 2016-08-27 DIAGNOSIS — I480 Paroxysmal atrial fibrillation: Secondary | ICD-10-CM | POA: Diagnosis not present

## 2016-08-27 DIAGNOSIS — K5903 Drug induced constipation: Secondary | ICD-10-CM | POA: Diagnosis not present

## 2016-08-27 DIAGNOSIS — K5909 Other constipation: Secondary | ICD-10-CM | POA: Diagnosis not present

## 2016-08-27 LAB — GLUCOSE, CAPILLARY
GLUCOSE-CAPILLARY: 136 mg/dL — AB (ref 65–99)
GLUCOSE-CAPILLARY: 144 mg/dL — AB (ref 65–99)
Glucose-Capillary: 146 mg/dL — ABNORMAL HIGH (ref 65–99)
Glucose-Capillary: 127 mg/dL — ABNORMAL HIGH (ref 65–99)

## 2016-08-27 MED ORDER — METOPROLOL TARTRATE 50 MG PO TABS
50.0000 mg | ORAL_TABLET | Freq: Two times a day (BID) | ORAL | Status: DC
  Administered 2016-08-27 – 2016-08-28 (×3): 50 mg via ORAL
  Filled 2016-08-27 (×3): qty 1

## 2016-08-27 MED ORDER — HEPARIN BOLUS VIA INFUSION
5000.0000 [IU] | Freq: Once | INTRAVENOUS | Status: AC
  Administered 2016-08-27: 5000 [IU] via INTRAVENOUS
  Filled 2016-08-27: qty 5000

## 2016-08-27 MED ORDER — HEPARIN (PORCINE) IN NACL 100-0.45 UNIT/ML-% IJ SOLN
1900.0000 [IU]/h | INTRAMUSCULAR | Status: DC
  Administered 2016-08-27: 1900 [IU]/h via INTRAVENOUS
  Filled 2016-08-27: qty 250

## 2016-08-27 MED ORDER — SENNOSIDES-DOCUSATE SODIUM 8.6-50 MG PO TABS
2.0000 | ORAL_TABLET | Freq: Two times a day (BID) | ORAL | Status: DC
  Administered 2016-08-27 – 2016-08-28 (×2): 2 via ORAL
  Filled 2016-08-27 (×2): qty 2

## 2016-08-27 MED ORDER — WARFARIN SODIUM 7.5 MG PO TABS
7.5000 mg | ORAL_TABLET | Freq: Once | ORAL | Status: AC
  Administered 2016-08-27: 7.5 mg via ORAL
  Filled 2016-08-27: qty 1

## 2016-08-27 MED ORDER — WARFARIN - PHARMACIST DOSING INPATIENT
Freq: Every day | Status: DC
Start: 2016-08-27 — End: 2016-08-28

## 2016-08-27 NOTE — Progress Notes (Signed)
Subjective:  No complaints of shortness of breath or chest pain.  His sigmoidoscopy yesterday showed hemorrhoids.  Objective:  Vital Signs in the last 24 hours: BP 104/69 (BP Location: Left Wrist)   Pulse 80   Temp 97.6 F (36.4 C) (Oral)   Resp 12   Ht 5\' 9"  (1.753 m)   Wt (!) 196.6 kg (433 lb 6.4 oz)   SpO2 92%   BMI 64.00 kg/m   Physical Exam: Severely obese white male in no acute distress Lungs:  Clear  Cardiac:  Irregular rhythm, normal S1 and S2, no S3 Extremities:  1+ edema present  Intake/Output from previous day: No intake/output data recorded. Weight Filed Weights   08/23/16 1227 08/25/16 0602 08/26/16 0426  Weight: (!) 213.6 kg (471 lb) (!) 197.6 kg (435 lb 11.2 oz) (!) 196.6 kg (433 lb 6.4 oz)    Lab Results: Basic Metabolic Panel:  Recent Labs  08/24/16 1120 08/26/16 0308  NA 137 138  K 4.1 4.2  CL 103 102  CO2 26 24  GLUCOSE 112* 135*  BUN 10 15  CREATININE 0.94 1.11    CBC:  Recent Labs  08/24/16 1849 08/26/16 0308  WBC 8.6 8.9  HGB 12.3* 12.9*  HCT 36.7* 38.2*  MCV 92.2 91.6  PLT 236 287    BNP    Component Value Date/Time   BNP 129.9 (H) 08/24/2016 0317    Cardiac Panel (last 3 results)  Recent Labs  08/24/16 1120  TROPONINI <0.03   Telemetry: Atrial fibrillation with controlled response  Assessment/Plan:  1.  Persistent atrial fibrillation controlled response not good candidate for rhythm control 2.  Severe super obesity 3.  Recent GI bleeding 4.  Sleep apnea 5.  Coronary calcification suggestive of CAD  Recommendations:  I would go ahead and start anticoagulation today to reduce his risk of stroke from atrial fibrillation.  We'll initiate Eliquis.    Kerry Hough  MD [facility]  08/27/2016, 10:55 AM

## 2016-08-27 NOTE — Progress Notes (Signed)
Clinical impression: Pt progressing toward OT goals. Facilitated education concerning use of walker splints to improve independence and safety with use of RW due to decreased grasp strength. However, this was unsuccessful as pt continues to have significant shoulder pain and is more successful bearing weight through elbows. Pt continues to demonstrate decreased activity tolerance for ADL and functional mobility with HR increasing to 160 on return to bed. OT will continue to follow while admitted with next session to focus on energy conservation strategies.    08/27/16 0800  OT Visit Information  Last OT Received On 08/27/16  Assistance Needed +1  History of Present Illness Pt admitted with BRBPR, R LE numbness and generalized weakness. Found to have afib with RVR. PMH: morbid obesity, HTN, DNM with neuropathy, hypothyroidism, depression, OSA on cpap, colon cancer s/p partial resection, restless leg.  Precautions  Precautions Fall  Precaution Comments watch HR- A-fib  Pain Assessment  Pain Assessment Faces  Faces Pain Scale 4  Pain Location B shoulders during ADL  Pain Descriptors / Indicators Grimacing  Pain Intervention(s) Limited activity within patient's tolerance;Monitored during session;Repositioned  Cognition  Arousal/Alertness Awake/alert  Behavior During Therapy WFL for tasks assessed/performed  Overall Cognitive Status Within Functional Limits for tasks assessed  ADL  Overall ADL's  At baseline  Functional mobility during ADLs Min guard;Rolling walker  General ADL Comments Educated, demonstrated, and facilitated practice utilizing walker splints. Pt does not feel comfortable with walker splints due to continued shoulder pain and is more successful bearing weight through elbows. Discussed potential use of platforms to increase ease of ambulation and decrease stress on shoulders.   Bed Mobility  Overal bed mobility Needs Assistance  Bed Mobility Supine to Sit  Supine to sit  Supervision;HOB elevated  General bed mobility comments increased time  Balance  Overall balance assessment Needs assistance  Sitting-balance support Feet supported;No upper extremity supported  Sitting balance-Leahy Scale Good  Standing balance support During functional activity  Standing balance-Leahy Scale Poor  Standing balance comment requires B UE support of walker  Restrictions  Weight Bearing Restrictions No  Transfers  Overall transfer level Needs assistance  Equipment used Rolling walker (2 wheeled)  Transfers Sit to/from Stand  Sit to Stand Min guard  General transfer comment uses momentum, increased time to push into standing with forearms supported by RW. Transferred to powerchair post ambulation to eat lunch.  General Comments  General comments (skin integrity, edema, etc.) HR up to 160 briefly during return to bed and ranged from 90s-120s at rest. RN aware.   OT - End of Session  Equipment Utilized During Treatment Rolling walker  Activity Tolerance Patient limited by fatigue  Patient left in bed;with call bell/phone within reach  Nurse Communication Mobility status  OT Assessment/Plan  OT Plan Discharge plan remains appropriate  OT Visit Diagnosis Unsteadiness on feet (R26.81);Muscle weakness (generalized) (M62.81);Pain  Pain - Right/Left Left  Pain - part of body Shoulder  OT Frequency (ACUTE ONLY) Min 2X/week  Follow Up Recommendations Home health OT  OT Equipment None recommended by OT  AM-PAC OT "6 Clicks" Daily Activity Outcome Measure  Help from another person eating meals? 4  Help from another person taking care of personal grooming? 4  Help from another person toileting, which includes using toliet, bedpan, or urinal? 4  Help from another person bathing (including washing, rinsing, drying)? 3  Help from another person to put on and taking off regular upper body clothing? 4  Help from another person to put  on and taking off regular lower body clothing? 3   6 Click Score 22  ADL G Code Conversion CJ  OT Goal Progression  Progress towards OT goals Progressing toward goals  Acute Rehab OT Goals  Patient Stated Goal to return home  OT Goal Formulation With patient  Time For Goal Achievement 09/08/16  Potential to Achieve Goals Good  OT Time Calculation  OT Start Time (ACUTE ONLY) 0736  OT Stop Time (ACUTE ONLY) 0805  OT Time Calculation (min) 29 min  OT G-codes **NOT FOR INPATIENT CLASS**  Functional Assessment Tool Used Clinical judgement  Functional Limitation Self care  Self Care Current Status (V8550) CI  Self Care Goal Status (Z5868) CI  OT General Charges  $OT Visit 1 Procedure  OT Treatments  $Self Care/Home Management  23-37 mins   Norman Herrlich, MS OTR/L  Pager: [phone]

## 2016-08-27 NOTE — Progress Notes (Signed)
PROGRESS NOTE                                                                                                                                                                                                             Patient Demographics:    Brian Richmond, is a 61 y.o. male, DOB - 02/14/1955, ZOX:[id number]  Admit date - 08/23/2016   Admitting Physician Ivor Costa, MD  Outpatient Primary MD for the patient is Hulan Fess, MD  LOS - 0  Outpatient Specialists: Dr. Earle Gell  Chief Complaint  Patient presents with  . Rectal Bleeding  . Weakness       Brief Narrative   61 year old morbidly obese male with history of hypertension, diabetes mellitus, hypothyroidism, depression, OSA on CPAP, restless leg syndrome, colon cancer in 2010 status post partial colonic resection with last repeat colonoscopy in 2015, BPH who presented with bright red blood per rectum, right leg numbness and generalized weakness. Patient reports having generalized weakness for past 4-6 weeks with associated twitching pain on his right thigh. He reports being frequently constipated and denies any urinary symptoms (except for urgency and hesitancy associated with BPH). Patient noticed bright red blood per rectum on 2-3 episodes since past 3 days. Informs having similar symptoms in the past which is associated with straining during bowel movement. The ED FOBT was positive. Hemoglobin of 12.3, normal WBC, d-dimer of 3.22. Remaining labs were stable. He was also found to be in A. fib with RVR. Chest x-ray was unremarkable. CT of the abdomen and pelvis showed stool burden without other acute findings. Placed on telemetry.   Subjective:   No further rectal bleed. Discussed anticoagulation options and patient agrees.  Assessment  & Plan :    Principal Problem:   New onset atrial fibrillation With RVR (HCC) Episodes of elevated heart rate in 120s.  Increased metoprolol dose to 50 mg twice a day. 2-D echo with normal EF and no wall motion abnormality (limited study due to his body habitus). TSH mildly elevated, Synthroid dose adjusted. Sigmoidoscopy shows nonbleeding internal and external hemorrhoids. Started on Coumadin. (Discussed with cardiology none to bridge with heparin, cannot use newer AC due to his body weight)    Active Problems: Rectal bleed No further episodes. H&H stable. Sigmoidoscopy shows nonbleeding internal and external Hemorrhoids. Added Anusol suppository.   Positive  d-dimer Asymptomatic. Lower extremity Doppler negative for DVT.   Hypothyroidism TSH elevated to 4.41. Increased Synthroid dose.   Right leg numbness with? restless leg syndrome Ongoing for past several weeks.  improved with empiric pramipexole on admission Patient refused MRI of the lumbar spine being claustrophobic. PT/OT recommends no follow up. Symptoms better.  Constipation Patient reports taking MS Contin and Percocet for chronic low back pain. Continue MiraLAX and Senokot. Dulcolax suppository when necessary.  BPH Continue Flomax and oxybutynin  OSA Continue nighttime CPAP  Diabetes mellitus type 2 with neuropathy Monitor on SSI.  (patient on Amaryl and metformin at home)  Essential hypertension Continue lisinopril.  Depression Stable. Continue Cymbalta.  Morbid obesity Reports he has lost almost 80 lbs in 1 year ( intentionally)   Code Status : Full code  Family Communication  : None at bedside  Disposition Plan  : Home tomorrow if heart rate remained stable.  Barriers For Discharge : Active symptoms  Consults  :   [facility]  Procedures  :  2-D echo CT abdomen and pelvis  DVT Prophylaxis  : Coumadin  Lab Results  Component Value Date   PLT 287 08/26/2016    Antibiotics  :   Anti-infectives    None        Objective:   Vitals:   08/26/16 1305 08/26/16 1615 08/26/16 2142 08/27/16 0522    BP: 137/88 125/75 (!) 94/53 104/69  Pulse: 71  76 80  Resp: 13 (!) 21 13 12   Temp:   98.7 F (37.1 C) 97.6 F (36.4 C)  TempSrc:   Oral Oral  SpO2: 98% 99% 99% 92%  Weight:      Height:        Wt Readings from Last 3 Encounters:  08/26/16 (!) 196.6 kg (433 lb 6.4 oz)  10/13/14 (!) 213.6 kg (471 lb)  12/05/13 (!) 213.6 kg (471 lb)    No intake or output data in the 24 hours ending 08/27/16 1309   Physical Exam  Gen.: Morbidly obese male not in distress HEENT: Moist mucosa, supple neck Chest: Clear to auscultation bilaterally  CVS: S1 and S2 irregular, no murmurs GI: Soft, nondistended, nontender Musculoskeletal: Warm, no edema        Data Review:    CBC  Recent Labs Lab 08/23/16 1240 08/24/16 0317 08/24/16 1120 08/24/16 1849 08/26/16 0308  WBC 8.9 9.4 8.7 8.6 8.9  HGB 12.3* 13.3 11.9* 12.3* 12.9*  HCT 37.0* 39.4 35.9* 36.7* 38.2*  PLT 291 290 292 236 287  MCV 92.0 91.2 92.8 92.2 91.6  MCH 30.6 30.8 30.7 30.9 30.9  MCHC 33.2 33.8 33.1 33.5 33.8  RDW 13.7 13.5 14.1 13.6 13.7    Chemistries   Recent Labs Lab 08/23/16 1240 08/23/16 2222 08/24/16 1120 08/26/16 0308  NA 135  --  137 138  K 4.1  --  4.1 4.2  CL 103  --  103 102  CO2 20*  --  26 24  GLUCOSE 131*  --  112* 135*  BUN 11  --  10 15  CREATININE 0.94  --  0.94 1.11  CALCIUM 9.0  --  9.2 9.1  MG  --  2.0  --   --    ------------------------------------------------------------------------------------------------------------------ No results for input(s): CHOL, HDL, LDLCALC, TRIG, CHOLHDL, LDLDIRECT in the last 72 hours.  Lab Results  Component Value Date   HGBA1C 5.8 (H) 08/24/2016   ------------------------------------------------------------------------------------------------------------------ No results for input(s): TSH, T4TOTAL, T3FREE, THYROIDAB in  the last 72 hours.  Invalid input(s):  FREET3 ------------------------------------------------------------------------------------------------------------------ No results for input(s): VITAMINB12, FOLATE, FERRITIN, TIBC, IRON, RETICCTPCT in the last 72 hours.  Coagulation profile  Recent Labs Lab 08/24/16 0317  INR 1.13    No results for input(s): DDIMER in the last 72 hours.  Cardiac Enzymes  Recent Labs Lab 08/24/16 0317 08/24/16 1120  TROPONINI <0.03 <0.03   ------------------------------------------------------------------------------------------------------------------    Component Value Date/Time   BNP 129.9 (H) 08/24/2016 0317    Inpatient Medications  Scheduled Meds: . DULoxetine  30 mg Oral Daily  . gabapentin  100 mg Oral BID  . hydrocortisone  25 mg Rectal BID  . insulin aspart  0-5 Units Subcutaneous QHS  . insulin aspart  0-9 Units Subcutaneous TID WC  . levothyroxine  50 mcg Oral QAC breakfast  . lisinopril  10 mg Oral q morning - 10a  . metoprolol tartrate  50 mg Oral BID  . morphine  30 mg Oral Q8H  . oxybutynin  5 mg Oral TID  . pantoprazole (PROTONIX) IV  40 mg Intravenous Q12H  . polyethylene glycol  17 g Oral BID  . pramipexole  0.125 mg Oral QHS  . senna-docusate  1 tablet Oral BID  . tamsulosin  0.4 mg Oral Daily  . warfarin  7.5 mg Oral ONCE-1800  . Warfarin - Pharmacist Dosing Inpatient   Does not apply q1800   Continuous Infusions:  PRN Meds:.acetaminophen, bisacodyl, cyclobenzaprine, hydrALAZINE, ondansetron (ZOFRAN) IV, oxyCODONE-acetaminophen, pseudoephedrine, zolpidem  Micro Results Recent Results (from the past 240 hour(s))  MRSA PCR Screening     Status: None   Collection Time: 08/24/16  5:36 PM  Result Value Ref Range Status   MRSA by PCR NEGATIVE NEGATIVE Final    Comment:        The GeneXpert MRSA Assay (FDA approved for NASAL specimens only), is one component of a comprehensive MRSA colonization surveillance program. It is not intended to diagnose  MRSA infection nor to guide or monitor treatment for MRSA infections.     Radiology Reports Dg Chest 2 View  Result Date: 08/23/2016 CLINICAL DATA:  Atrial fibrillation, weakness and rectal bleeding. EXAM: CHEST  2 VIEW COMPARISON:  None. FINDINGS: The heart size and mediastinal contours are within normal limits. There is no evidence of pulmonary edema, consolidation, pneumothorax, nodule or pleural fluid. The visualized skeletal structures are unremarkable. IMPRESSION: No active cardiopulmonary disease. Electronically Signed   By: Aletta Edouard M.D.   On: 08/23/2016 22:06   Ct Abdomen Pelvis W Contrast  Result Date: 08/23/2016 CLINICAL DATA:  Abdominal pain query gastroenteritis or colitis. EXAM: CT ABDOMEN AND PELVIS WITH CONTRAST TECHNIQUE: Multidetector CT imaging of the abdomen and pelvis was performed using the standard protocol following bolus administration of intravenous contrast. CONTRAST:  169mL ISOVUE-300 IOPAMIDOL (ISOVUE-300) INJECTION 61% COMPARISON:  None. FINDINGS: Lower chest: Coronary arteriosclerosis. Heart is top-normal in size. No pericardial effusion. There is aortic atherosclerosis. Hepatobiliary: Hepatic steatosis. Calcification in the right hepatic lobe consistent with a granuloma. Mild distention of the gallbladder with probable biliary sludge noted within. No wall thickening or pericholecystic fluid. No biliary dilatation. Pancreas: Mild fatty atrophy without mass or ductal dilatation. Spleen: Normal without mass. 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 The kidneys, ureters and bladder are nonacute. Stomach/Bowel: Contracted stomach. Normal small bowel rotation without inflammation or distention. No obstruction. Normal appendix. A moderate degree of fecal residue is noted within the cecum and ascending colon as well as  distal descending colon through rectum. Vascular/Lymphatic: Aortoiliac atherosclerosis  without aneurysm. No adenopathy. Reproductive: Normal size prostate. Other: Ventral midline scarring. Small fat containing umbilical hernia. Musculoskeletal: Thoracolumbar spondylosis. No acute osseous abnormality. IMPRESSION: 1. Moderate amount of fecal residue within large bowel without inflammation. Query constipation. 2. Coronary arteriosclerosis. 3. Hepatic steatosis with right hepatic granuloma. Biliary sludge noted within the gallbladder without evidence of acute cholecystitis. 4. Aortoiliac atherosclerosis. 5. Thoracolumbar spondylosis. Electronically Signed   By: Ashley Royalty M.D.   On: 08/23/2016 21:58    Time Spent in minutes  25   Louellen Molder M.D on 08/27/2016 at 1:09 PM  Between 7am to 7pm - Pager - [phone]  After 7pm go to [email or web address] - password [facility]  [facility] -  Office  [phone]

## 2016-08-27 NOTE — Progress Notes (Signed)
ANTICOAGULATION CONSULT NOTE - Initial Consult  Pharmacy Consult for Heparin/Coumadin Indication: atrial fibrillation  No Known Allergies  Patient Measurements: Height: 5\' 9"  (175.3 cm) Weight: (!) 433 lb 6.4 oz (196.6 kg) IBW/kg (Calculated) : 70.7 Heparin Dosing Weight: 121 kg  Vital Signs: Temp: 97.6 F (36.4 C) (08/12 0522) Temp Source: Oral (08/12 0522) BP: 104/69 (08/12 0522) Pulse Rate: 80 (08/12 0522)  Labs:  Recent Labs  08/24/16 1120 08/24/16 1849 08/26/16 0308  HGB 11.9* 12.3* 12.9*  HCT 35.9* 36.7* 38.2*  PLT 292 236 287  CREATININE 0.94  --  1.11  TROPONINI <0.03  --   --     Estimated Creatinine Clearance: 118.2 mL/min (by C-G formula based on SCr of 1.11 mg/dL).   Medical History: Past Medical History:  Diagnosis Date  . Carpal tunnel syndrome    bilateral, and neuropathy bilateral feet  . Colon cancer ([place]) 03/2008   surgery only  . Diabetes mellitus without complication ([place])   . Hx of seasonal allergies   . Hypertension   . Neuropathy   . Neuropathy   . Obesity    BMI 68.0 kg  . OSA (obstructive sleep apnea)    uses cpap nightly  . Reduced mobility    06-27-13 states can transfer self.limited on distances-uses walker and wheelchair mostly.    Medications:  Scheduled:  . DULoxetine  30 mg Oral Daily  . gabapentin  100 mg Oral BID  . hydrocortisone  25 mg Rectal BID  . insulin aspart  0-5 Units Subcutaneous QHS  . insulin aspart  0-9 Units Subcutaneous TID WC  . levothyroxine  50 mcg Oral QAC breakfast  . lisinopril  10 mg Oral q morning - 10a  . metoprolol tartrate  50 mg Oral BID  . morphine  30 mg Oral Q8H  . oxybutynin  5 mg Oral TID  . pantoprazole (PROTONIX) IV  40 mg Intravenous Q12H  . polyethylene glycol  17 g Oral BID  . pramipexole  0.125 mg Oral QHS  . senna-docusate  1 tablet Oral BID  . tamsulosin  0.4 mg Oral Daily    Assessment: 61 yo male morbidly obese patient presents to ED with complaints of rectal  bleeding, right leg numbness and generalized weakness. During his physical exam, he was found to be in atrial fibrillation with RVR,  CHA2DS2-VASc Score ~2, D-Dimer was positive, but chest X-ray unremarkable. Currently on rhythm control only per cardiology. GI consulted for rectal bleeding, patient has external hemorrhoids confirmed on flexible sigmoidoscopy, and bleeding has resolved. Spoke to RN this AM and confirmed no further rectal bleeding. Hgb and plts stable.  Pharmacy originally consulted to dose apixaban for AFib, but due to patients BMI of 64, recommended to provider heparin bridge to coumadin instead. Patient not on PTA AC. Baseline INR 1.13 and aPTT 25 on 8/9.    Goal of Therapy:  Heparin level 0.3-0.7 units/ml Monitor platelets by anticoagulation protocol: Yes  INR 2-3   Plan:  Heparin 5000 unit IV bolus x 1 Heparin gtt 1900 units/hr HL at 6-hour Coumadin 7.5 mg po x 1 Daily INR, HL, and CBC Monitor s/sx of bleeding   Leroy Libman, PharmD Pharmacy Resident Pager: [phone]

## 2016-08-28 ENCOUNTER — Encounter (HOSPITAL_COMMUNITY): Payer: Self-pay | Admitting: Cardiology

## 2016-08-28 DIAGNOSIS — E038 Other specified hypothyroidism: Secondary | ICD-10-CM | POA: Diagnosis not present

## 2016-08-28 DIAGNOSIS — K5903 Drug induced constipation: Secondary | ICD-10-CM

## 2016-08-28 DIAGNOSIS — G2581 Restless legs syndrome: Secondary | ICD-10-CM | POA: Diagnosis not present

## 2016-08-28 DIAGNOSIS — K625 Hemorrhage of anus and rectum: Secondary | ICD-10-CM | POA: Diagnosis not present

## 2016-08-28 DIAGNOSIS — R531 Weakness: Secondary | ICD-10-CM | POA: Diagnosis not present

## 2016-08-28 DIAGNOSIS — I4891 Unspecified atrial fibrillation: Secondary | ICD-10-CM | POA: Diagnosis not present

## 2016-08-28 DIAGNOSIS — Z6841 Body Mass Index (BMI) 40.0 and over, adult: Secondary | ICD-10-CM

## 2016-08-28 DIAGNOSIS — K5909 Other constipation: Secondary | ICD-10-CM | POA: Diagnosis not present

## 2016-08-28 DIAGNOSIS — K648 Other hemorrhoids: Secondary | ICD-10-CM | POA: Diagnosis not present

## 2016-08-28 LAB — CBC
HCT: 36 % — ABNORMAL LOW (ref 39.0–52.0)
Hemoglobin: 11.9 g/dL — ABNORMAL LOW (ref 13.0–17.0)
MCH: 30.4 pg (ref 26.0–34.0)
MCHC: 33.1 g/dL (ref 30.0–36.0)
MCV: 91.8 fL (ref 78.0–100.0)
PLATELETS: 259 10*3/uL (ref 150–400)
RBC: 3.92 MIL/uL — AB (ref 4.22–5.81)
RDW: 13.7 % (ref 11.5–15.5)
WBC: 7.3 10*3/uL (ref 4.0–10.5)

## 2016-08-28 LAB — GLUCOSE, CAPILLARY
GLUCOSE-CAPILLARY: 140 mg/dL — AB (ref 65–99)
Glucose-Capillary: 148 mg/dL — ABNORMAL HIGH (ref 65–99)
Glucose-Capillary: 140 mg/dL — ABNORMAL HIGH (ref 65–99)

## 2016-08-28 LAB — PROTIME-INR
INR: 1.07
Prothrombin Time: 13.9 seconds (ref 11.4–15.2)

## 2016-08-28 MED ORDER — HYDROCORTISONE ACETATE 25 MG RE SUPP: 25.0000 mg | Freq: Two times a day (BID) | RECTAL | 0 refills | Status: AC

## 2016-08-28 MED ORDER — WARFARIN SODIUM 7.5 MG PO TABS: 7.5000 mg | ORAL_TABLET | Freq: Once | ORAL | Status: DC

## 2016-08-28 MED ORDER — SENNOSIDES-DOCUSATE SODIUM 8.6-50 MG PO TABS: 2.0000 | ORAL_TABLET | Freq: Two times a day (BID) | ORAL | 0 refills | Status: AC

## 2016-08-28 MED ORDER — METOPROLOL TARTRATE 25 MG PO TABS: 25.0000 mg | ORAL_TABLET | Freq: Two times a day (BID) | ORAL | 0 refills | Status: DC

## 2016-08-28 MED ORDER — METOPROLOL TARTRATE 25 MG PO TABS: 25.0000 mg | ORAL_TABLET | Freq: Two times a day (BID) | ORAL | Status: DC

## 2016-08-28 MED ORDER — POLYETHYLENE GLYCOL 3350 17 G PO PACK: 17.0000 g | PACK | Freq: Two times a day (BID) | ORAL | 0 refills | Status: AC

## 2016-08-28 MED ORDER — LEVOTHYROXINE SODIUM 25 MCG PO TABS: 50.0000 ug | ORAL_TABLET | Freq: Every day | ORAL | 0 refills | Status: AC

## 2016-08-28 MED ORDER — PRAMIPEXOLE DIHYDROCHLORIDE 0.125 MG PO TABS: 0.1250 mg | ORAL_TABLET | Freq: Every day | ORAL | 0 refills | Status: AC

## 2016-08-28 MED ORDER — WARFARIN SODIUM 7.5 MG PO TABS: 7.5000 mg | ORAL_TABLET | Freq: Every day | ORAL | 0 refills | Status: DC

## 2016-08-28 MED ORDER — BISACODYL 5 MG PO TBEC: 10.0000 mg | DELAYED_RELEASE_TABLET | Freq: Every day | ORAL | 0 refills | Status: AC | PRN

## 2016-08-28 NOTE — Discharge Summary (Signed)
Physician Discharge Summary  Brian Richmond WPY:[id number] DOB: Jun 25, 1955 DOA: 08/23/2016  PCP: Hulan Fess, MD  Admit date: 08/23/2016 Discharge date: 08/28/2016  Admitted From: Home Disposition: Home  Recommendations for Outpatient Follow-up:  1. Follow up with PCP in 1 week. Please H&H during outpatient follow-up. 2. Follow-up with cardiology (office will arrange appointment) in 2 weeks. 3. Patient is being started on Coumadin for atrial fibrillation.  Home Health: RN Equipment/Devices: Nighttime CPAP  Discharge Condition: Fair CODE STATUS: Full code Diet recommendation: Heart Healthy / Carb Modified    Discharge Diagnoses:  Principal Problem:   Atrial fibrillation with RVR (HCC)   Active Problems:   Rectal bleeding   New onset atrial fibrillation (HCC)   Depression   Diabetes mellitus with diabetic neuropathy (HCC)   HTN (hypertension)   Hypothyroidism   BPH (benign prostatic hyperplasia)   Constipation   Right leg numbness   Generalized weakness   Positive D dimer   Internal hemorrhoid, bleeding   Morbid obesity with body mass index (BMI) of 60.0 to 69.9 in adult [facility])   Restless leg syndrome  Brief narrative/history of present illness Please refer to admission H&P for details, in brief,61 year old morbidly obese male with history of hypertension, diabetes mellitus, hypothyroidism, depression, OSA on CPAP, restless leg syndrome, colon cancer in 2010 status post partial colonic resection with last repeat colonoscopy in 2015, BPH who presented with bright red blood per rectum, right leg numbness and generalized weakness. Patient reports having generalized weakness for past 4-6 weeks with associated twitching pain on his right thigh. He reports being frequently constipated and denies any urinary symptoms (except for urgency and hesitancy associated with BPH). Patient noticed bright red blood per rectum on 2-3 episodes since past 3 days. Informs having similar symptoms  in the past which is associated with straining during bowel movement. The ED FOBT was positive. Hemoglobin of 12.3, normal WBC, d-dimer of 3.22. Remaining labs were stable. He was also found to be in A. fib with RVR. Chest x-ray was unremarkable. CT of the abdomen and pelvis showed stool burden without other acute findings. Placed on telemetry.  Hospital course Principal Problem:   New onset atrial fibrillation With RVR (Ray) Started on low-dose metoprolol 25 mg twice a day. 2-D echo with normal EF and no wall motion abnormality (limited study due to his body habitus). TSH mildly elevated, Synthroid dose adjusted. Started on Coumadin after sigmoidoscopy. No need for bridging per cardiology. Heart rate has remained stable. Patient stable to be discharged and cardiology will arrange outpatient follow-up.  INR will be checked by home health nurse and results faxed to cardiology office.   Active Problems: Rectal bleed No further episodes. H&H stable. Sigmoidoscopy done shows nonbleeding internal and external Hemorrhoids. Added Anusol suppository.   Positive d-dimer Asymptomatic. Lower extremity Doppler negative for DVT.   Hypothyroidism TSH elevated to 4.41. Increased Synthroid dose.   Right leg numbness with? restless leg syndrome Ongoing for past several weeks.  improved with empiric pramipexole on admission . Patient refused MRI of the lumbar spine being claustrophobic. PT/OT recommends no follow up. Symptoms improved.  Constipation Patient reports taking MS Contin and Percocet for chronic low back pain. Continue MiraLAX and Senokot. Dulcolax suppository when necessary. Encouraged intake of diet rich in fiber.  BPH Continue Flomax and oxybutynin  OSA Continue nighttime CPAP  Diabetes mellitus type 2 with neuropathy Stable on sliding scale coverage. Resume Amaryl and metformin upon discharge.  Essential hypertension Continue lisinopril.  Depression Stable.  Continue Cymbalta.  Morbid obesity Reports he has lost almost 80 lbs in 1 year ( intentionally).  Chronic low back pain On morphine and oxycodone at home which is continued. Continue bowel regimen.   Family Communication  : None at bedside  Disposition Plan  : Home  with home health    Consults  :   [facility]  Procedures  :  2-D echo CT abdomen and pelvis Sigmoidoscopy  Discharge Instructions   Allergies as of 08/28/2016   No Known Allergies     Medication List    TAKE these medications   bisacodyl 5 MG EC tablet Commonly known as:  DULCOLAX Take 2 tablets (10 mg total) by mouth daily as needed for moderate constipation.   cyclobenzaprine 10 MG tablet Commonly known as:  FLEXERIL Take 1 tablet (10 mg total) by mouth 3 (three) times daily as needed for muscle spasms.   DULoxetine 30 MG capsule Commonly known as:  CYMBALTA TAKE 1 CAPSULE EVERY DAY What changed:  See the new instructions.   gabapentin 100 MG capsule Commonly known as:  NEURONTIN TAKE 1 CAPSULE(100 MG) BY MOUTH THREE TIMES DAILY What changed:  See the new instructions.   glimepiride 2 MG tablet Commonly known as:  AMARYL Take 3 mg by mouth at bedtime.   hydrocortisone 25 MG suppository Commonly known as:  ANUSOL-HC Place 1 suppository (25 mg total) rectally 2 (two) times daily.   levothyroxine 25 MCG tablet Commonly known as:  SYNTHROID, LEVOTHROID Take 50 mcg by mouth daily.   lisinopril 10 MG tablet Commonly known as:  PRINIVIL,ZESTRIL Take 10 mg by mouth every morning.   metFORMIN 500 MG 24 hr tablet Commonly known as:  GLUCOPHAGE-XR Take 500 mg by mouth 2 (two) times daily.   metoprolol tartrate 25 MG tablet Commonly known as:  LOPRESSOR Take 1 tablet (25 mg total) by mouth 2 (two) times daily.   morphine 30 MG 12 hr tablet Commonly known as:  MS CONTIN Take 1 tablet (30 mg total) by mouth 3 (three) times daily.   nystatin 100000 UNIT/ML  suspension Commonly known as:  MYCOSTATIN SWISH AND SWALLOW 5 ml's once a day as needed for thrush   oxybutynin 5 MG tablet Commonly known as:  DITROPAN Take 5 mg by mouth 3 (three) times daily.   oxyCODONE-acetaminophen 7.5-325 MG tablet Commonly known as:  PERCOCET Take 1 tablet by mouth every 6 (six) hours as needed. What changed:  when to take this   polyethylene glycol packet Commonly known as:  MIRALAX / GLYCOLAX Take 17 g by mouth 2 (two) times daily.   pramipexole 0.125 MG tablet Commonly known as:  MIRAPEX Take 1 tablet (0.125 mg total) by mouth at bedtime.   pseudoephedrine 30 MG tablet Commonly known as:  SUDAFED Take 30 mg by mouth every 6 (six) hours as needed for congestion.   senna-docusate 8.6-50 MG tablet Commonly known as:  Senokot-S Take 2 tablets by mouth 2 (two) times daily.   tamsulosin 0.4 MG Caps capsule Commonly known as:  FLOMAX Take 0.4 mg by mouth daily.   warfarin 7.5 MG tablet Commonly known as:  COUMADIN Take 1 tablet (7.5 mg total) by mouth daily at 6 PM.      Follow-up Information    [facility].   Why:  Home Health Registered Nurse -agency will contact you to arrange initial visit Contact information: [address] [place] [address] [phone]  Croitoru, Mihai, MD Follow up in 2 week(s).   Specialty:  Cardiology Contact information: [address] [place] [address] [phone]        Hulan Fess, MD. Schedule an appointment as soon as possible for a visit in 1 week(s).   Specialty:  Family Medicine Contact information: [place] [address] [phone]          No Known Allergies    Procedures/Studies: Dg Chest 2 View  Result Date: 08/23/2016 CLINICAL DATA:  Atrial fibrillation, weakness and rectal bleeding. EXAM: CHEST  2 VIEW COMPARISON:  None. FINDINGS: The heart size and mediastinal contours are within normal  limits. There is no evidence of pulmonary edema, consolidation, pneumothorax, nodule or pleural fluid. The visualized skeletal structures are unremarkable. IMPRESSION: No active cardiopulmonary disease. Electronically Signed   By: Aletta Edouard M.D.   On: 08/23/2016 22:06   Ct Abdomen Pelvis W Contrast  Result Date: 08/23/2016 CLINICAL DATA:  Abdominal pain query gastroenteritis or colitis. EXAM: CT ABDOMEN AND PELVIS WITH CONTRAST TECHNIQUE: Multidetector CT imaging of the abdomen and pelvis was performed using the standard protocol following bolus administration of intravenous contrast. CONTRAST:  138mL ISOVUE-300 IOPAMIDOL (ISOVUE-300) INJECTION 61% COMPARISON:  None. FINDINGS: Lower chest: Coronary arteriosclerosis. Heart is top-normal in size. No pericardial effusion. There is aortic atherosclerosis. Hepatobiliary: Hepatic steatosis. Calcification in the right hepatic lobe consistent with a granuloma. Mild distention of the gallbladder with probable biliary sludge noted within. No wall thickening or pericholecystic fluid. No biliary dilatation. Pancreas: Mild fatty atrophy without mass or ductal dilatation. Spleen: Normal without mass. 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 The kidneys, ureters and bladder are nonacute. Stomach/Bowel: Contracted stomach. Normal small bowel rotation without inflammation or distention. No obstruction. Normal appendix. A moderate degree of fecal residue is noted within the cecum and ascending colon as well as distal descending colon through rectum. Vascular/Lymphatic: Aortoiliac atherosclerosis without aneurysm. No adenopathy. Reproductive: Normal size prostate. Other: Ventral midline scarring. Small fat containing umbilical hernia. Musculoskeletal: Thoracolumbar spondylosis. No acute osseous abnormality. IMPRESSION: 1. Moderate amount of fecal residue within large bowel without inflammation. Query  constipation. 2. Coronary arteriosclerosis. 3. Hepatic steatosis with right hepatic granuloma. Biliary sludge noted within the gallbladder without evidence of acute cholecystitis. 4. Aortoiliac atherosclerosis. 5. Thoracolumbar spondylosis. Electronically Signed   By: Ashley Royalty M.D.   On: 08/23/2016 21:58    2-D echo Study Conclusions  - Left ventricle: The cavity size was normal. There was moderate   concentric hypertrophy. Systolic function was normal. The   estimated ejection fraction was in the range of 60% to 65%. Wall   motion was normal; there were no regional wall motion   abnormalities. The study was not technically sufficient to allow   evaluation of LV diastolic dysfunction due to atrial   fibrillation. - Aortic valve: Trileaflet; moderately thickened, moderately   calcified leaflets. There was mild stenosis. Mean gradient (S):   11 mm Hg. Peak gradient (S): 18 mm Hg. Valve area (VTI): 2.47   cm^2. Valve area (Vmax): 2.42 cm^2. Valve area (Vmean): 2.47   cm^2. - Aortic root: The aortic root was normal in size. - Mitral valve: Not visualized. - Left atrium: The atrium was moderately dilated. - Right atrium: Not visualized. - Tricuspid valve: Not visualized. - Pulmonary arteries: Systolic pressure could not be accurately   estimated. - Inferior vena cava: Not visualized.  Impressions:  - Very limited study quality. LVEF  normal. RVEF most probably   normal. Mild aortic stenosis.  Subjective: Heart rate better controlled, episodes of bradycardia in the 50s but asymptomatic. No further rectal bleed.  Discharge Exam: Vitals:   08/28/16 0539 08/28/16 0820  BP: 101/62 120/83  Pulse: 68 66  Resp: 18   Temp: 97.9 F (36.6 C)   SpO2: 100%    Vitals:   08/27/16 2202 08/27/16 2324 08/28/16 0539 08/28/16 0820  BP:  104/74 101/62 120/83  Pulse: 68 95 68 66  Resp:  17 18   Temp:  98 F (36.7 C) 97.9 F (36.6 C)   TempSrc:  Axillary Axillary   SpO2:  100% 100%    Weight:      Height:         Gen.: Morbidly obese male not in distress HEENT: Moist mucosa, supple neck Chest: Clear to auscultation bilaterally  CVS: S1 and S2 irregular, no murmurs GI: Soft, nondistended, nontender Musculoskeletal: Warm, no edema    The results of significant diagnostics from this hospitalization (including imaging, microbiology, ancillary and laboratory) are listed below for reference.     Microbiology: Recent Results (from the past 240 hour(s))  MRSA PCR Screening     Status: None   Collection Time: 08/24/16  5:36 PM  Result Value Ref Range Status   MRSA by PCR NEGATIVE NEGATIVE Final    Comment:        The GeneXpert MRSA Assay (FDA approved for NASAL specimens only), is one component of a comprehensive MRSA colonization surveillance program. It is not intended to diagnose MRSA infection nor to guide or monitor treatment for MRSA infections.      Labs: BNP (last 3 results)  Recent Labs  08/24/16 0317  BNP 607.3*   Basic Metabolic Panel:  Recent Labs Lab 08/23/16 1240 08/23/16 2222 08/24/16 1120 08/26/16 0308  NA 135  --  137 138  K 4.1  --  4.1 4.2  CL 103  --  103 102  CO2 20*  --  26 24  GLUCOSE 131*  --  112* 135*  BUN 11  --  10 15  CREATININE 0.94  --  0.94 1.11  CALCIUM 9.0  --  9.2 9.1  MG  --  2.0  --   --    Liver Function Tests: No results for input(s): AST, ALT, ALKPHOS, BILITOT, PROT, ALBUMIN in the last 168 hours. No results for input(s): LIPASE, AMYLASE in the last 168 hours. No results for input(s): AMMONIA in the last 168 hours. CBC:  Recent Labs Lab 08/24/16 0317 08/24/16 1120 08/24/16 1849 08/26/16 0308 08/28/16 0142  WBC 9.4 8.7 8.6 8.9 7.3  HGB 13.3 11.9* 12.3* 12.9* 11.9*  HCT 39.4 35.9* 36.7* 38.2* 36.0*  MCV 91.2 92.8 92.2 91.6 91.8  PLT 290 292 236 287 259   Cardiac Enzymes:  Recent Labs Lab 08/24/16 0317 08/24/16 1120  TROPONINI <0.03 <0.03   BNP: Invalid input(s):  POCBNP CBG:  Recent Labs Lab 08/27/16 0722 08/27/16 1116 08/27/16 1619 08/27/16 2128 08/28/16 0733  GLUCAP 136* 144* 146* 127* 148*   D-Dimer No results for input(s): DDIMER in the last 72 hours. Hgb A1c No results for input(s): HGBA1C in the last 72 hours. Lipid Profile No results for input(s): CHOL, HDL, LDLCALC, TRIG, CHOLHDL, LDLDIRECT in the last 72 hours. Thyroid function studies No results for input(s): TSH, T4TOTAL, T3FREE, THYROIDAB in the last 72 hours.  Invalid input(s): FREET3 Anemia work up No results for input(s): VITAMINB12, FOLATE, FERRITIN,  TIBC, IRON, RETICCTPCT in the last 72 hours. Urinalysis    Component Value Date/Time   COLORURINE YELLOW 08/23/2016 [place] 08/23/2016 1305   LABSPEC 1.018 08/23/2016 1305   PHURINE 6.0 08/23/2016 1305   GLUCOSEU NEGATIVE 08/23/2016 1305   HGBUR NEGATIVE 08/23/2016 [place] 08/23/2016 1305   KETONESUR NEGATIVE 08/23/2016 1305   PROTEINUR NEGATIVE 08/23/2016 1305   NITRITE NEGATIVE 08/23/2016 1305   LEUKOCYTESUR NEGATIVE 08/23/2016 1305   Sepsis Labs Invalid input(s): PROCALCITONIN,  WBC,  LACTICIDVEN Microbiology Recent Results (from the past 240 hour(s))  MRSA PCR Screening     Status: None   Collection Time: 08/24/16  5:36 PM  Result Value Ref Range Status   MRSA by PCR NEGATIVE NEGATIVE Final    Comment:        The GeneXpert MRSA Assay (FDA approved for NASAL specimens only), is one component of a comprehensive MRSA colonization surveillance program. It is not intended to diagnose MRSA infection nor to guide or monitor treatment for MRSA infections.      Time coordinating discharge: Over 30 minutes  SIGNED:   Louellen Molder, MD  [facility] 08/28/2016, 11:11 AM Pager   If 7PM-7AM, please contact night-coverage [email or web address] Password [email or web address]

## 2016-08-28 NOTE — Discharge Instructions (Addendum)
Atrial Fibrillation Atrial fibrillation is a type of heartbeat that is irregular or fast (rapid). If you have this condition, your heart keeps quivering in a weird (chaotic) way. This condition can make it so your heart cannot pump blood normally. Having this condition gives a person more risk for stroke, heart failure, and other heart problems. There are different types of atrial fibrillation. Talk with your doctor to learn about the type that you have. Follow these instructions at home:  Take over-the-counter and prescription medicines only as told by your doctor.  If your doctor prescribed a blood-thinning medicine, take it exactly as told. Taking too much of it can cause bleeding. If you do not take enough of it, you will not have the protection that you need against stroke and other problems.  Do not use any tobacco products. These include cigarettes, chewing tobacco, and e-cigarettes. If you need help quitting, ask your doctor.  If you have apnea (obstructive sleep apnea), manage it as told by your doctor.  Do not drink alcohol.  Do not drink beverages that have caffeine. These include coffee, soda, and tea.  Maintain a healthy weight. Do not use diet pills unless your doctor says they are safe for you. Diet pills may make heart problems worse.  Follow diet instructions as told by your doctor.  Exercise regularly as told by your doctor.  Keep all follow-up visits as told by your doctor. This is important. Contact a doctor if:  You notice a change in the speed, rhythm, or strength of your heartbeat.  You are taking a blood-thinning medicine and you notice more bruising.  You get tired more easily when you move or exercise. Get help right away if:  You have pain in your chest or your belly (abdomen).  You have sweating or weakness.  You feel sick to your stomach (nauseous).  You notice blood in your throw up (vomit), poop (stool), or pee (urine).  You are short of  breath.  You suddenly have swollen feet and ankles.  You feel dizzy.  Your suddenly get weak or numb in your face, arms, or legs, especially if it happens on one side of your body.  You have trouble talking, trouble understanding, or both.  Your face or your eyelid droops on one side. These symptoms may be an emergency. Do not wait to see if the symptoms will go away. Get medical help right away. Call your local emergency services (911 in the [place] Do not drive yourself to the hospital. This information is not intended to replace advice given to you by your health care provider. Make sure you discuss any questions you have with your health care provider. Document Released: 10/12/2007 Document Revised: 06/10/2015 Document Reviewed: 04/29/2014 Elsevier Interactive Patient Education  2018 [employer].   Warfarin tablets What is this medicine? WARFARIN (WAR far in) is an anticoagulant. It is used to treat or prevent clots in the veins, arteries, lungs, or heart. This medicine may be used for other purposes; ask your health care provider or pharmacist if you have questions. COMMON BRAND NAME(S): Coumadin, Jantoven What should I tell my health care provider before I take this medicine? They need to know if you have any of these conditions: -alcoholism -anemia -bleeding disorders -cancer -diabetes -heart disease -high blood pressure -history of bleeding in the gastrointestinal tract -history of stroke or other brain injury or disease -kidney or liver disease -protein C deficiency -protein S deficiency -psychosis or dementia -recent injury, recent or  planned surgery or procedure -an unusual or allergic reaction to warfarin, other medicines, foods, dyes, or preservatives -pregnant or trying to get pregnant -breast-feeding How should I use this medicine? Take this medicine by mouth with a glass of water. Follow the directions on the prescription label. You can take this medicine  with or without food. Take your medicine at the same time each day. Do not take it more often than directed. Do not stop taking except on your doctor's advice. Stopping this medicine may increase your risk of a blood clot. Be sure to refill your prescription before you run out of medicine. If your doctor or healthcare professional calls to change your dose, write down the dose and any other instructions. Always read the dose and instructions back to him or her to make sure you understand them. Tell your doctor or healthcare professional what strength of tablets you have on hand. Ask how many tablets you should take to equal your new dose. Write the date on the new instructions and keep them near your medicine. If you are told to stop taking your medicine until your next blood test, call your doctor or healthcare professional if you do not hear anything within 24 hours of the test to find out your new dose or when to restart your prior dose. A special MedGuide will be given to you by the pharmacist with each prescription and refill. Be sure to read this information carefully each time. Talk to your pediatrician regarding the use of this medicine in children. Special care may be needed. Overdosage: If you think you have taken too much of this medicine contact a poison control center or emergency room at once. NOTE: This medicine is only for you. Do not share this medicine with others. What if I miss a dose? It is important not to miss a dose. If you miss a dose, call your healthcare provider. Take the dose as soon as possible on the same day. If it is almost time for your next dose, take only that dose. Do not take double or extra doses to make up for a missed dose. What may interact with this medicine? Do not take this medicine with any of the following medications: -agents that prevent or dissolve blood clots -aspirin or other salicylates -danshen -dextrothyroxine -mifepristone -St. John's Wort -red  yeast rice This medicine may also interact with the following medications: -acetaminophen -agents that lower cholesterol -alcohol -allopurinol -amiodarone -antibiotics or medicines for treating bacterial, fungal or viral infections -azathioprine -barbiturate medicines for inducing sleep or treating seizures -certain medicines for diabetes -certain medicines for heart rhythm problems -certain medicines for hepatitis C virus infections like daclatasvir, dasabuvir; ombitasvir; paritaprevir; ritonavir, elbasvir; grazoprevir, ledipasvir; sofosbuvir, simeprevir, sofosbuvir, sofosbuvir; velpatasvir, sofosbuvir; velpatasvir; voxilaprevir -certain medicines for high blood pressure -chloral hydrate -cisapride -conivaptan -disulfiram -male hormones, including contraceptive or birth control pills -general anesthetics -herbal or dietary products like garlic, ginkgo, ginseng, green tea, or kava kava -influenza virus vaccine -male hormones -medicines for mental depression or psychosis -medicines for some types of cancer -medicines for stomach problems -methylphenidate -NSAIDs, medicines for pain and inflammation, like ibuprofen or naproxen -propoxyphene -quinidine, quinine -raloxifene -seizure or epilepsy medicine like carbamazepine, phenytoin, and valproic acid -steroids like cortisone and prednisone -tamoxifen -thyroid medicine -tramadol -vitamin c, vitamin e, and vitamin K -zafirlukast -zileuton This list may not describe all possible interactions. Give your health care provider a list of all the medicines, herbs, non-prescription drugs, or dietary supplements you use. Also tell  them if you smoke, drink alcohol, or use illegal drugs. Some items may interact with your medicine. What should I watch for while using this medicine? Visit your doctor or health care professional for regular checks on your progress. You will need to have a blood test called a PT/INR regularly. The PT/INR blood  test is done to make sure you are getting the right dose of this medicine. It is important to not miss your appointment for the blood tests. When you first start taking this medicine, these tests are done often. Once the correct dose is determined and you take your medicine properly, these tests can be done less often. Notify your doctor or health care professional and seek emergency treatment if you develop breathing problems; changes in vision; chest pain; severe, sudden headache; pain, swelling, warmth in the leg; trouble speaking; sudden numbness or weakness of the face, arm or leg. These can be signs that your condition has gotten worse. While you are taking this medicine, carry an identification card with your name, the name and dose of medicine(s) being used, and the name and phone number of your doctor or health care professional or person to contact in an emergency. Do not start taking or stop taking any medicines or over-the-counter medicines except on the advice of your doctor or health care professional. You should discuss your diet with your doctor or health care professional. Do not make major changes in your diet. Vitamin K can affect how well this medicine works. Many foods contain vitamin K. It is important to eat a consistent amount of foods with vitamin K. Other foods with vitamin K that you should eat in consistent amounts are asparagus, basil, black eyed peas, broccoli, brussel sprouts, cabbage, green onions, green tea, parsley, green leafy vegetables like beet greens, collard greens, kale, spinach, turnip greens, or certain lettuces like green leaf or romaine. This medicine can cause birth defects or bleeding in an unborn child. Women of childbearing age should use effective birth control while taking this medicine. If a woman becomes pregnant while taking this medicine, she should discuss the potential risks and her options with her health care professional. Avoid sports and activities  that might cause injury while you are using this medicine. Severe falls or injuries can cause unseen bleeding. Be careful when using sharp tools or knives. Consider using an Copy. Take special care brushing or flossing your teeth. Report any injuries, bruising, or red spots on the skin to your doctor or health care professional. If you have an illness that causes vomiting, diarrhea, or fever for more than a few days, contact your doctor. Also check with your doctor if you are unable to eat for several days. These problems can change the effect of this medicine. Even after you stop taking this medicine, it takes several days before your body recovers its normal ability to clot blood. Ask your doctor or health care professional how long you need to be careful. If you are going to have surgery or dental work, tell your doctor or health care professional that you have been taking this medicine. What side effects may I notice from receiving this medicine? Side effects that you should report to your doctor or health care professional as soon as possible: -allergic reactions like skin rash, itching or hives, swelling of the face, lips, or tongue -breathing problems -chest pain -dizziness -headache -heavy menstrual bleeding or vaginal bleeding -pain in the lower back or side -painful, blue or purple toes -  painful skin ulcers that do not go away -signs and symptoms of bleeding such as bloody or black, tarry stools; red or dark-brown urine; spitting up blood or brown material that looks like coffee grounds; red spots on the skin; unusual bruising or bleeding from the eye, gums, or nose -stomach pain -unusually weak or tired Side effects that usually do not require medical attention (report to your doctor or health care professional if they continue or are bothersome): -diarrhea -hair loss This list may not describe all possible side effects. Call your doctor for medical advice about side effects.  You may report side effects to FDA at 1-800-FDA-1088. Where should I keep my medicine? Keep out of the reach of children. Store at room temperature between 15 and 30 degrees C (59 and 86 degrees F). Protect from light. Throw away any unused medicine after the expiration date. Do not flush down the toilet. NOTE: This sheet is a summary. It may not cover all possible information. If you have questions about this medicine, talk to your doctor, pharmacist, or health care provider.  2018 Elsevier/Gold Standard (2015-12-23 11:27:41)  ---------------------------------------------  Information on my medicine - Coumadin   (Warfarin)  This medication education was reviewed with me or my healthcare representative as part of my discharge preparation.  The pharmacist that spoke with me during my hospital stay was:  Dareen Piano, [facility]  Why was Coumadin prescribed for you? Coumadin was prescribed for you because you have a blood clot or a medical condition that can cause an increased risk of forming blood clots. Blood clots can cause serious health problems by blocking the flow of blood to the heart, lung, or brain. Coumadin can prevent harmful blood clots from forming. As a reminder your indication for Coumadin is:   Stroke Prevention Because Of Atrial Fibrillation  What test will check on my response to Coumadin? While on Coumadin (warfarin) you will need to have an INR test regularly to ensure that your dose is keeping you in the desired range. The INR (international normalized ratio) number is calculated from the result of the laboratory test called prothrombin time (PT).  If an INR APPOINTMENT HAS NOT ALREADY BEEN MADE FOR YOU please schedule an appointment to have this lab work done by your health care provider within 7 days. Your INR goal is usually a number between:  2 to 3 or your provider may give you a more narrow range like 2-2.5.  Ask your health care provider during an office visit what  your goal INR is.  What  do you need to  know  About  COUMADIN? Take Coumadin (warfarin) exactly as prescribed by your healthcare provider about the same time each day.  DO NOT stop taking without talking to the doctor who prescribed the medication.  Stopping without other blood clot prevention medication to take the place of Coumadin may increase your risk of developing a new clot or stroke.  Get refills before you run out.  What do you do if you miss a dose? If you miss a dose, take it as soon as you remember on the same day then continue your regularly scheduled regimen the next day.  Do not take two doses of Coumadin at the same time.  Important Safety Information A possible side effect of Coumadin (Warfarin) is an increased risk of bleeding. You should call your healthcare provider right away if you experience any of the following: ? Bleeding from an injury or your nose that  does not stop. ? Unusual colored urine (red or dark brown) or unusual colored stools (red or black). ? Unusual bruising for unknown reasons. ? A serious fall or if you hit your head (even if there is no bleeding).  Some foods or medicines interact with Coumadin (warfarin) and might alter your response to warfarin. To help avoid this: ? Eat a balanced diet, maintaining a consistent amount of Vitamin K. ? Notify your provider about major diet changes you plan to make. ? Avoid alcohol or limit your intake to 1 drink for women and 2 drinks for men per day. (1 drink is 5 oz. wine, 12 oz. beer, or 1.5 oz. liquor.)  Make sure that ANY health care provider who prescribes medication for you knows that you are taking Coumadin (warfarin).  Also make sure the healthcare provider who is monitoring your Coumadin knows when you have started a new medication including herbals and non-prescription products.  Coumadin (Warfarin)  Major Drug Interactions  Increased Warfarin Effect Decreased Warfarin Effect  Alcohol (large  quantities) Antibiotics (esp. Septra/Bactrim, Flagyl, Cipro) Amiodarone (Cordarone) Aspirin (ASA) Cimetidine (Tagamet) Megestrol (Megace) NSAIDs (ibuprofen, naproxen, etc.) Piroxicam (Feldene) Propafenone (Rythmol SR) Propranolol (Inderal) Isoniazid (INH) Posaconazole (Noxafil) Barbiturates (Phenobarbital) Carbamazepine (Tegretol) Chlordiazepoxide (Librium) Cholestyramine (Questran) Griseofulvin Oral Contraceptives Rifampin Sucralfate (Carafate) Vitamin K   Coumadin (Warfarin) Major Herbal Interactions  Increased Warfarin Effect Decreased Warfarin Effect  Garlic Ginseng Ginkgo biloba Coenzyme Q10 Green tea St. Johns wort    Coumadin (Warfarin) FOOD Interactions  Eat a consistent number of servings per week of foods HIGH in Vitamin K (1 serving =  cup)  Collards (cooked, or boiled & drained) Kale (cooked, or boiled & drained) Mustard greens (cooked, or boiled & drained) Parsley *serving size only =  cup Spinach (cooked, or boiled & drained) Swiss chard (cooked, or boiled & drained) Turnip greens (cooked, or boiled & drained)  Eat a consistent number of servings per week of foods MEDIUM-HIGH in Vitamin K (1 serving = 1 cup)  Asparagus (cooked, or boiled & drained) Broccoli (cooked, boiled & drained, or raw & chopped) Brussel sprouts (cooked, or boiled & drained) *serving size only =  cup Lettuce, raw (green leaf, endive, romaine) Spinach, raw Turnip greens, raw & chopped   These websites have more information on Coumadin (warfarin):  FailFactory.se; VeganReport.com.au;

## 2016-08-28 NOTE — Care Management (Signed)
Case Management Note Initial Note Started by Erenest Rasher, RN 08/25/2016, 10:58 AM  Patient Details  Name: Brian Richmond MRN: [id number] Date of Birth: Jan 20, 1955  Subjective/Objective:   Afib with RVR, rectal bleeding, positive d-dimer                 Action/Plan: Discharge Planning: NCM spoke to pt at bedside. Wife at home to assist as needed. Has ramp, power wheelchair, RW, van with lift, and CPAP machine at home. Pt states he drives himself to his appts. He is working on getting another power wheelchair. Having issues with his w/c. Provided pt with [facility] list and offered choice for HH.  Will need HH RN/PT orders with [facility] if [place] needed. Pt has an aide that comes once per week to give bath. Will continue to follow for dc needs.  Pt requested [facility] for HH. Contacted rep with new referral.   PCP- Hulan Fess MD   Expected Discharge Date:                         Expected Discharge Plan:  [place]  In-House Referral:  NA  Discharge planning Services  CM Consult  Post Acute Care Choice:  Home Health Choice offered to:  Patient  DME Arranged:  N/A DME Agency:  NA  HH Arranged: RN [place] Agency: [facility]  Status of Service: Completed.   If discussed at [place] of Stay Meetings, dates discussed:    Additional Comments: 1038 08-28-16 Jacqlyn Krauss, RN,BSN [phone] Plan for d/c home today. [place] Services to be rendered will be for [employer] and Lab draws. MD to place orders. Pt has been offered choice and referral made to [facility]. Liaison Butch Penny with [facility] aware pt to be d/c 08-28-16.  SOC to begin within 24-48 hours post d/c. No further needs from CM at this time.

## 2016-08-28 NOTE — Progress Notes (Signed)
ANTICOAGULATION CONSULT NOTE - Initial Consult  Pharmacy Consult for Heparin/Coumadin Indication: atrial fibrillation  No Known Allergies  Patient Measurements: Height: 5\' 9"  (175.3 cm) Weight: (!) 433 lb 6.4 oz (196.6 kg) IBW/kg (Calculated) : 70.7 Heparin Dosing Weight: 121 kg  Vital Signs: Temp: 97.9 F (36.6 C) (08/13 0539) Temp Source: Axillary (08/13 0539) BP: 120/83 (08/13 0820) Pulse Rate: 66 (08/13 0820)  Labs:  Recent Labs  08/26/16 0308 08/28/16 0142  HGB 12.9* 11.9*  HCT 38.2* 36.0*  PLT 287 259  LABPROT  --  13.9  INR  --  1.07  CREATININE 1.11  --     Estimated Creatinine Clearance: 118.2 mL/min (by C-G formula based on SCr of 1.11 mg/dL).   Medical History: Past Medical History:  Diagnosis Date  . Carpal tunnel syndrome    bilateral, and neuropathy bilateral feet  . Colon cancer ([place]) 03/2008   surgery only  . Diabetes mellitus without complication ([place])   . Hx of seasonal allergies   . Hypertension   . Neuropathy   . Obesity    BMI 68.0 kg  . OSA (obstructive sleep apnea)    uses cpap nightly  . Reduced mobility    06-27-13 states can transfer self.limited on distances-uses walker and wheelchair mostly.    Medications:  Scheduled:  . DULoxetine  30 mg Oral Daily  . gabapentin  100 mg Oral BID  . hydrocortisone  25 mg Rectal BID  . insulin aspart  0-5 Units Subcutaneous QHS  . insulin aspart  0-9 Units Subcutaneous TID WC  . levothyroxine  50 mcg Oral QAC breakfast  . lisinopril  10 mg Oral q morning - 10a  . metoprolol tartrate  25 mg Oral BID  . morphine  30 mg Oral Q8H  . oxybutynin  5 mg Oral TID  . pantoprazole (PROTONIX) IV  40 mg Intravenous Q12H  . polyethylene glycol  17 g Oral BID  . pramipexole  0.125 mg Oral QHS  . senna-docusate  2 tablet Oral BID  . tamsulosin  0.4 mg Oral Daily  . Warfarin - Pharmacist Dosing Inpatient   Does not apply q1800    Assessment: 61 yo male morbidly obese patient presents to ED with  complaints of rectal bleeding, right leg numbness and generalized weakness. During his physical exam, he was found to be in atrial fibrillation with RVR,  CHA2DS2-VASc Score ~2. GI consulted for rectal bleeding, patient has external hemorrhoids confirmed on flexible sigmoidoscopy, and bleeding has resolved.  Pharmacy originally consulted to dose apixaban for AFib, but due to patients BMI of 64, recommended to provider heparin bridge to coumadin instead. Patient not on PTA AC. No heparin bridge per MD. -INR= 1.07    Goal of Therapy:  Monitor platelets by anticoagulation protocol: Yes  INR 2-3   Plan:  -Coumadin 7.5mg  po today -Upon discharge when consider coumadin 7.5mg /day with close INR follow-up -Daily PT/INR while inpatient  Hildred Laser, Pharm D 08/28/2016 11:11 AM

## 2016-08-28 NOTE — Progress Notes (Signed)
Progress Note  Patient Name: Brian Richmond Date of Encounter: 08/28/2016  Primary Cardiologist:  Dr. Sallyanne Kuster.     Subjective   Does not feel the atrial fib.  No pain.  No SOB.   Inpatient Medications    Scheduled Meds: . DULoxetine  30 mg Oral Daily  . gabapentin  100 mg Oral BID  . hydrocortisone  25 mg Rectal BID  . insulin aspart  0-5 Units Subcutaneous QHS  . insulin aspart  0-9 Units Subcutaneous TID WC  . levothyroxine  50 mcg Oral QAC breakfast  . lisinopril  10 mg Oral q morning - 10a  . metoprolol tartrate  25 mg Oral BID  . morphine  30 mg Oral Q8H  . oxybutynin  5 mg Oral TID  . pantoprazole (PROTONIX) IV  40 mg Intravenous Q12H  . polyethylene glycol  17 g Oral BID  . pramipexole  0.125 mg Oral QHS  . senna-docusate  2 tablet Oral BID  . tamsulosin  0.4 mg Oral Daily  . Warfarin - Pharmacist Dosing Inpatient   Does not apply q1800   Continuous Infusions:  PRN Meds: acetaminophen, bisacodyl, cyclobenzaprine, hydrALAZINE, ondansetron (ZOFRAN) IV, oxyCODONE-acetaminophen, pseudoephedrine, zolpidem   Vital Signs    Vitals:   08/27/16 2202 08/27/16 2324 08/28/16 0539 08/28/16 0820  BP:  104/74 101/62 120/83  Pulse: 68 95 68 66  Resp:  17 18   Temp:  98 F (36.7 C) 97.9 F (36.6 C)   TempSrc:  Axillary Axillary   SpO2:  100% 100%   Weight:      Height:        Intake/Output Summary (Last 24 hours) at 08/28/16 0854 Last data filed at 08/28/16 0547  Gross per 24 hour  Intake              240 ml  Output             1850 ml  Net            -1610 ml   Filed Weights   08/23/16 1227 08/25/16 0602 08/26/16 0426  Weight: (!) 471 lb (213.6 kg) (!) 435 lb 11.2 oz (197.6 kg) (!) 433 lb 6.4 oz (196.6 kg)    Telemetry    Atrial fib with controlled ventricular rate - Personally Reviewed  ECG    NA - Personally Reviewed  Physical Exam   GEN: No acute distress.   Neck: No  JVD Cardiac: Irregular RR, no murmurs, rubs, or gallops.  Respiratory:  Clear  to auscultation bilaterally. GI: Soft, nontender, non-distended  MS: No  edema; No deformity. Neuro:  Nonfocal  Psych: Normal affect   Labs    Chemistry Recent Labs Lab 08/23/16 1240 08/24/16 1120 08/26/16 0308  NA 135 137 138  K 4.1 4.1 4.2  CL 103 103 102  CO2 20* 26 24  GLUCOSE 131* 112* 135*  BUN 11 10 15   CREATININE 0.94 0.94 1.11  CALCIUM 9.0 9.2 9.1  GFRNONAA >60 >60 >60  GFRAA >60 >60 >60  ANIONGAP 12 8 12      Hematology Recent Labs Lab 08/24/16 1849 08/26/16 0308 08/28/16 0142  WBC 8.6 8.9 7.3  RBC 3.98* 4.17* 3.92*  HGB 12.3* 12.9* 11.9*  HCT 36.7* 38.2* 36.0*  MCV 92.2 91.6 91.8  MCH 30.9 30.9 30.4  MCHC 33.5 33.8 33.1  RDW 13.6 13.7 13.7  PLT 236 287 259    Cardiac Enzymes Recent Labs Lab 08/24/16 0317 08/24/16 1120  TROPONINI <  0.03 <0.03    Recent Labs Lab 08/23/16 2118  TROPIPOC 0.00     BNP Recent Labs Lab 08/24/16 0317  BNP 129.9*     DDimer  Recent Labs Lab 08/23/16 2222  DDIMER 3.22*     Radiology    No results found.  Cardiac Studies   ECHO:  08/25/16  Study Conclusions  - Left ventricle: The cavity size was normal. There was moderate   concentric hypertrophy. Systolic function was normal. The   estimated ejection fraction was in the range of 60% to 65%. Wall   motion was normal; there were no regional wall motion   abnormalities. The study was not technically sufficient to allow   evaluation of LV diastolic dysfunction due to atrial   fibrillation. - Aortic valve: Trileaflet; moderately thickened, moderately   calcified leaflets. There was mild stenosis. Mean gradient (S):   11 mm Hg. Peak gradient (S): 18 mm Hg. Valve area (VTI): 2.47   cm^2. Valve area (Vmax): 2.42 cm^2. Valve area (Vmean): 2.47   cm^2. - Aortic root: The aortic root was normal in size. - Mitral valve: Not visualized. - Left atrium: The atrium was moderately dilated. - Right atrium: Not visualized. - Tricuspid valve: Not  visualized. - Pulmonary arteries: Systolic pressure could not be accurately   estimated. - Inferior vena cava: Not visualized.  Impressions:  - Very limited study quality. LVEF normal. RVEF most probably   normal. Mild aortic stenosis.  Patient Profile     61 y.o. male superobese male with newly diagnosed persistent atrial fibrillation, presenting with hematochezia. Additional problems include hypertension, diabetes mellitus, hypothyroidism, obstructive sleep apnea compliant with CPAP, colon cancer status post partial colectomy.  We were asked to see because of his atrial fib with rapid rate.   Assessment & Plan    ATRIAL FIB:  Rate controlled.  Continue current therapy.  Need to arrange follow up of warfarin at discharge.  We will arrange in our office.   CORONARY CALCIUM:  Noted incidentally.  Not a candidate for noninvasive image.  Manage with risk factor modification.      Signed, Minus Breeding, MD  08/28/2016, 8:54 AM

## 2016-08-28 NOTE — Progress Notes (Signed)
Physical Therapy Treatment Patient Details Name: Brian Richmond MRN: [id number] DOB: 12-14-1955 Today's Date: 08/28/2016    History of Present Illness Pt admitted with BRBPR, R LE numbness and generalized weakness. Found to have afib with RVR and hemorrhoids. PMH: morbid obesity, HTN, DNM with neuropathy, hypothyroidism, depression, OSA on cpap, colon cancer s/p partial resection, restless leg.    PT Comments    Patient progressing slowly towards PT goals. Improved ambulation distance today but limited due to pain in forearms/shoulders and elevated HR up to 145 bpm. Pt eager to return home. Explained importance of fall reduction esp now that pt is going on Coumadin. Recommend placing chairs throughout house as rest points of needed. Will continue to follow.    Follow Up Recommendations  No PT follow up;Supervision for mobility/OOB     Equipment Recommendations  None recommended by PT    Recommendations for Other Services       Precautions / Restrictions Precautions Precautions: Fall Precaution Comments: watch HR- A-fib Restrictions Weight Bearing Restrictions: No    Mobility  Bed Mobility Overal bed mobility: Needs Assistance Bed Mobility: Rolling;Sidelying to Sit;Sit to Supine Rolling: Modified independent (Device/Increase time) Sidelying to sit: Modified independent (Device/Increase time)   Sit to supine: Modified independent (Device/Increase time)   General bed mobility comments: increased time but no assist needed.   Transfers Overall transfer level: Needs assistance Equipment used: Rolling walker (2 wheeled) Transfers: Sit to/from Stand Sit to Stand: Min guard         General transfer comment: uses momentum, increased time to push into standing with forearms supported by RW. Stood from [employer], from toilet x1.  Ambulation/Gait Ambulation/Gait assistance: Min guard Ambulation Distance (Feet): 60 Feet (+24') Assistive device: Rolling walker (2 wheeled) Gait  Pattern/deviations: Step-through pattern;Decreased stride length;Wide base of support;Trunk flexed   Gait velocity interpretation: Below normal speed for age/gender General Gait Details: Decreased knee flexion bilaterally during swing phase; pt leaning on RW with forearms for comfort (cannot use hands due to weakness and pain). 2/4 DOE. HR up to 145 bpm   Stairs            Wheelchair Mobility    Modified Rankin (Stroke Patients Only)       Balance Overall balance assessment: Needs assistance Sitting-balance support: Feet supported;No upper extremity supported Sitting balance-Leahy Scale: Good Sitting balance - Comments: Able to reach outside of BoS and donn socks without difficulty.    Standing balance support: During functional activity Standing balance-Leahy Scale: Poor Standing balance comment: requires B UE support of walker                            Cognition Arousal/Alertness: Awake/alert Behavior During Therapy: WFL for tasks assessed/performed Overall Cognitive Status: Within Functional Limits for tasks assessed                                        Exercises      General Comments        Pertinent Vitals/Pain Pain Assessment: Faces Faces Pain Scale: Hurts little more Pain Location: B shoulders during walking Pain Descriptors / Indicators: Grimacing;Sore Pain Intervention(s): Monitored during session;Repositioned;Limited activity within patient's tolerance    Home Living                      Prior Function  PT Goals (current goals can now be found in the care plan section) Progress towards PT goals: Progressing toward goals    Frequency    Min 3X/week      PT Plan Current plan remains appropriate    Co-evaluation              AM-PAC PT "6 Clicks" Daily Activity  Outcome Measure  Difficulty turning over in bed (including adjusting bedclothes, sheets and blankets)?: None Difficulty  moving from lying on back to sitting on the side of the bed? : None Difficulty sitting down on and standing up from a chair with arms (e.g., wheelchair, bedside commode, etc,.)?: None Help needed moving to and from a bed to chair (including a wheelchair)?: A Little Help needed walking in hospital room?: A Little Help needed climbing 3-5 steps with a railing? : [employer] 6 Click Score: 20    End of Session   Activity Tolerance: Patient limited by fatigue Patient left: in bed;with call bell/phone within reach Nurse Communication: Mobility status PT Visit Diagnosis: Unsteadiness on feet (R26.81);Muscle weakness (generalized) (M62.81);Pain Pain - Right/Left:  (bilaterally) Pain - part of body: Shoulder     Time: 1833-5825 PT Time Calculation (min) (ACUTE ONLY): 23 min  Charges:  $Gait Training: 23-37 mins                    G Codes:       Wray Kearns, PT, DPT [phone]     Marguarite Arbour A Klark Vanderhoef 08/28/2016, 11:10 AM

## 2016-08-29 ENCOUNTER — Telehealth: Payer: Self-pay | Admitting: Cardiovascular Disease

## 2016-08-29 ENCOUNTER — Ambulatory Visit (INDEPENDENT_AMBULATORY_CARE_PROVIDER_SITE_OTHER): Payer: Medicare HMO | Admitting: Pharmacist Clinician (PhC)/ Clinical Pharmacy Specialist

## 2016-08-29 DIAGNOSIS — E669 Obesity, unspecified: Secondary | ICD-10-CM | POA: Diagnosis not present

## 2016-08-29 DIAGNOSIS — G4733 Obstructive sleep apnea (adult) (pediatric): Secondary | ICD-10-CM | POA: Diagnosis not present

## 2016-08-29 DIAGNOSIS — I10 Essential (primary) hypertension: Secondary | ICD-10-CM | POA: Diagnosis not present

## 2016-08-29 DIAGNOSIS — E039 Hypothyroidism, unspecified: Secondary | ICD-10-CM | POA: Diagnosis not present

## 2016-08-29 DIAGNOSIS — E1142 Type 2 diabetes mellitus with diabetic polyneuropathy: Secondary | ICD-10-CM | POA: Diagnosis not present

## 2016-08-29 DIAGNOSIS — F329 Major depressive disorder, single episode, unspecified: Secondary | ICD-10-CM | POA: Diagnosis not present

## 2016-08-29 DIAGNOSIS — I4891 Unspecified atrial fibrillation: Secondary | ICD-10-CM

## 2016-08-29 DIAGNOSIS — K644 Residual hemorrhoidal skin tags: Secondary | ICD-10-CM | POA: Diagnosis not present

## 2016-08-29 DIAGNOSIS — Z7901 Long term (current) use of anticoagulants: Secondary | ICD-10-CM

## 2016-08-29 DIAGNOSIS — L89601 Pressure ulcer of unspecified heel, stage 1: Secondary | ICD-10-CM | POA: Diagnosis not present

## 2016-08-29 LAB — POCT INR: INR: 1.2

## 2016-08-29 NOTE — Telephone Encounter (Signed)
Reviewed information about BP with DOD - Dr. Claiborne Billings.  Pt to stop lisinopril for now, cut metoprolol from 25 mg bid to 12.5 mg bid.   HH to check BP again Wednesday and Friday.    RN Precious Bard voiced understanding.

## 2016-08-29 NOTE — Telephone Encounter (Signed)
New message    Heather at [facility] states Pt is new afib pt and has orders for daily INR monitoring and every couple of weeks until he is seen in our office. She is asking if it is ok if they do this twice per week versus daily. She states he will be a Dr. Loletha Grayer patients as soon as he is discharged. She is just letting us know.

## 2016-08-29 NOTE — Telephone Encounter (Signed)
INR monitoring inquiry routed to coumadin clinic.

## 2016-08-29 NOTE — Telephone Encounter (Signed)
Late entry:  As noted, Erasmo Downer returned call to [facility] to address pt's INR concerns, also of concern pt had low BP reading 72/54 obtained by [facility] today. Pt has received MD instruction to reduce metoprolol and follow BP trends at [facility] visits later in the week.

## 2016-08-30 DIAGNOSIS — I10 Essential (primary) hypertension: Secondary | ICD-10-CM | POA: Diagnosis not present

## 2016-08-30 DIAGNOSIS — E669 Obesity, unspecified: Secondary | ICD-10-CM | POA: Diagnosis not present

## 2016-08-30 DIAGNOSIS — E039 Hypothyroidism, unspecified: Secondary | ICD-10-CM | POA: Diagnosis not present

## 2016-08-30 DIAGNOSIS — I4891 Unspecified atrial fibrillation: Secondary | ICD-10-CM | POA: Diagnosis not present

## 2016-08-30 DIAGNOSIS — G4733 Obstructive sleep apnea (adult) (pediatric): Secondary | ICD-10-CM | POA: Diagnosis not present

## 2016-08-30 DIAGNOSIS — F329 Major depressive disorder, single episode, unspecified: Secondary | ICD-10-CM | POA: Diagnosis not present

## 2016-08-30 DIAGNOSIS — L89601 Pressure ulcer of unspecified heel, stage 1: Secondary | ICD-10-CM | POA: Diagnosis not present

## 2016-08-30 DIAGNOSIS — K644 Residual hemorrhoidal skin tags: Secondary | ICD-10-CM | POA: Diagnosis not present

## 2016-08-30 DIAGNOSIS — E1142 Type 2 diabetes mellitus with diabetic polyneuropathy: Secondary | ICD-10-CM | POA: Diagnosis not present

## 2016-09-01 ENCOUNTER — Ambulatory Visit (INDEPENDENT_AMBULATORY_CARE_PROVIDER_SITE_OTHER): Payer: Self-pay | Admitting: Pharmacist

## 2016-09-01 DIAGNOSIS — G4733 Obstructive sleep apnea (adult) (pediatric): Secondary | ICD-10-CM | POA: Diagnosis not present

## 2016-09-01 DIAGNOSIS — K644 Residual hemorrhoidal skin tags: Secondary | ICD-10-CM | POA: Diagnosis not present

## 2016-09-01 DIAGNOSIS — E039 Hypothyroidism, unspecified: Secondary | ICD-10-CM | POA: Diagnosis not present

## 2016-09-01 DIAGNOSIS — E669 Obesity, unspecified: Secondary | ICD-10-CM | POA: Diagnosis not present

## 2016-09-01 DIAGNOSIS — L89601 Pressure ulcer of unspecified heel, stage 1: Secondary | ICD-10-CM | POA: Diagnosis not present

## 2016-09-01 DIAGNOSIS — I10 Essential (primary) hypertension: Secondary | ICD-10-CM | POA: Diagnosis not present

## 2016-09-01 DIAGNOSIS — F329 Major depressive disorder, single episode, unspecified: Secondary | ICD-10-CM | POA: Diagnosis not present

## 2016-09-01 DIAGNOSIS — I4891 Unspecified atrial fibrillation: Secondary | ICD-10-CM | POA: Diagnosis not present

## 2016-09-01 DIAGNOSIS — E1142 Type 2 diabetes mellitus with diabetic polyneuropathy: Secondary | ICD-10-CM | POA: Diagnosis not present

## 2016-09-01 DIAGNOSIS — Z7901 Long term (current) use of anticoagulants: Secondary | ICD-10-CM

## 2016-09-01 LAB — PROTIME-INR: INR: 1.4 — AB (ref <=1.1)

## 2016-09-04 DIAGNOSIS — E669 Obesity, unspecified: Secondary | ICD-10-CM | POA: Diagnosis not present

## 2016-09-04 DIAGNOSIS — K644 Residual hemorrhoidal skin tags: Secondary | ICD-10-CM | POA: Diagnosis not present

## 2016-09-04 DIAGNOSIS — G4733 Obstructive sleep apnea (adult) (pediatric): Secondary | ICD-10-CM | POA: Diagnosis not present

## 2016-09-04 DIAGNOSIS — I4891 Unspecified atrial fibrillation: Secondary | ICD-10-CM | POA: Diagnosis not present

## 2016-09-04 DIAGNOSIS — E039 Hypothyroidism, unspecified: Secondary | ICD-10-CM | POA: Diagnosis not present

## 2016-09-04 DIAGNOSIS — F329 Major depressive disorder, single episode, unspecified: Secondary | ICD-10-CM | POA: Diagnosis not present

## 2016-09-04 DIAGNOSIS — E1142 Type 2 diabetes mellitus with diabetic polyneuropathy: Secondary | ICD-10-CM | POA: Diagnosis not present

## 2016-09-04 DIAGNOSIS — L89601 Pressure ulcer of unspecified heel, stage 1: Secondary | ICD-10-CM | POA: Diagnosis not present

## 2016-09-04 DIAGNOSIS — I10 Essential (primary) hypertension: Secondary | ICD-10-CM | POA: Diagnosis not present

## 2016-09-05 ENCOUNTER — Encounter (HOSPITAL_COMMUNITY): Payer: Self-pay | Admitting: Family Medicine

## 2016-09-05 ENCOUNTER — Emergency Department (HOSPITAL_COMMUNITY): Payer: Medicare HMO

## 2016-09-05 ENCOUNTER — Encounter: Payer: Medicare HMO | Admitting: Registered Nurse

## 2016-09-05 ENCOUNTER — Ambulatory Visit (INDEPENDENT_AMBULATORY_CARE_PROVIDER_SITE_OTHER): Payer: Medicare HMO | Admitting: Pharmacist

## 2016-09-05 DIAGNOSIS — Y999 Unspecified external cause status: Secondary | ICD-10-CM | POA: Diagnosis not present

## 2016-09-05 DIAGNOSIS — S93401A Sprain of unspecified ligament of right ankle, initial encounter: Secondary | ICD-10-CM | POA: Insufficient documentation

## 2016-09-05 DIAGNOSIS — Y939 Activity, unspecified: Secondary | ICD-10-CM | POA: Insufficient documentation

## 2016-09-05 DIAGNOSIS — I4891 Unspecified atrial fibrillation: Secondary | ICD-10-CM | POA: Diagnosis not present

## 2016-09-05 DIAGNOSIS — Z79899 Other long term (current) drug therapy: Secondary | ICD-10-CM | POA: Insufficient documentation

## 2016-09-05 DIAGNOSIS — M7989 Other specified soft tissue disorders: Secondary | ICD-10-CM | POA: Diagnosis not present

## 2016-09-05 DIAGNOSIS — Y929 Unspecified place or not applicable: Secondary | ICD-10-CM | POA: Diagnosis not present

## 2016-09-05 DIAGNOSIS — Z7901 Long term (current) use of anticoagulants: Secondary | ICD-10-CM

## 2016-09-05 DIAGNOSIS — W1839XA Other fall on same level, initial encounter: Secondary | ICD-10-CM | POA: Diagnosis not present

## 2016-09-05 DIAGNOSIS — I10 Essential (primary) hypertension: Secondary | ICD-10-CM | POA: Diagnosis not present

## 2016-09-05 DIAGNOSIS — Z7984 Long term (current) use of oral hypoglycemic drugs: Secondary | ICD-10-CM | POA: Insufficient documentation

## 2016-09-05 DIAGNOSIS — G4733 Obstructive sleep apnea (adult) (pediatric): Secondary | ICD-10-CM | POA: Diagnosis not present

## 2016-09-05 DIAGNOSIS — L89601 Pressure ulcer of unspecified heel, stage 1: Secondary | ICD-10-CM | POA: Diagnosis not present

## 2016-09-05 DIAGNOSIS — E039 Hypothyroidism, unspecified: Secondary | ICD-10-CM | POA: Insufficient documentation

## 2016-09-05 DIAGNOSIS — E669 Obesity, unspecified: Secondary | ICD-10-CM | POA: Diagnosis not present

## 2016-09-05 DIAGNOSIS — E119 Type 2 diabetes mellitus without complications: Secondary | ICD-10-CM | POA: Diagnosis not present

## 2016-09-05 DIAGNOSIS — S99811A Other specified injuries of right ankle, initial encounter: Secondary | ICD-10-CM | POA: Diagnosis present

## 2016-09-05 DIAGNOSIS — T148XXA Other injury of unspecified body region, initial encounter: Secondary | ICD-10-CM | POA: Diagnosis not present

## 2016-09-05 DIAGNOSIS — K644 Residual hemorrhoidal skin tags: Secondary | ICD-10-CM | POA: Diagnosis not present

## 2016-09-05 DIAGNOSIS — S99911A Unspecified injury of right ankle, initial encounter: Secondary | ICD-10-CM | POA: Diagnosis not present

## 2016-09-05 DIAGNOSIS — M2550 Pain in unspecified joint: Secondary | ICD-10-CM | POA: Diagnosis not present

## 2016-09-05 DIAGNOSIS — S8991XA Unspecified injury of right lower leg, initial encounter: Secondary | ICD-10-CM | POA: Insufficient documentation

## 2016-09-05 DIAGNOSIS — F329 Major depressive disorder, single episode, unspecified: Secondary | ICD-10-CM | POA: Diagnosis not present

## 2016-09-05 DIAGNOSIS — E1142 Type 2 diabetes mellitus with diabetic polyneuropathy: Secondary | ICD-10-CM | POA: Diagnosis not present

## 2016-09-05 DIAGNOSIS — M25561 Pain in right knee: Secondary | ICD-10-CM | POA: Diagnosis not present

## 2016-09-05 DIAGNOSIS — G4730 Sleep apnea, unspecified: Secondary | ICD-10-CM | POA: Diagnosis not present

## 2016-09-05 DIAGNOSIS — G589 Mononeuropathy, unspecified: Secondary | ICD-10-CM | POA: Diagnosis not present

## 2016-09-05 LAB — PROTIME-INR: INR: 2.2 — AB (ref <=1.1)

## 2016-09-05 NOTE — ED Triage Notes (Addendum)
Patient is from home and transported via [facility]. Patient was attempting to get in the passenger side of vehicle to go to physical therapy when his right leg "gave out" on him. Patient's left side was in the vehicle but his right side was not. Patient slid out of the vehicle and braced himself. He denies hitting his head and complaining of right knee pain and right ankle pain.

## 2016-09-06 ENCOUNTER — Emergency Department (HOSPITAL_COMMUNITY)
Admission: EM | Admit: 2016-09-06 | Discharge: 2016-09-06 | Disposition: A | Discharge status: Home / Self Care | Payer: Medicare HMO | Attending: Emergency Medicine | Admitting: Emergency Medicine

## 2016-09-06 DIAGNOSIS — L97412 Non-pressure chronic ulcer of right heel and midfoot with fat layer exposed: Secondary | ICD-10-CM | POA: Diagnosis not present

## 2016-09-06 DIAGNOSIS — G609 Hereditary and idiopathic neuropathy, unspecified: Secondary | ICD-10-CM | POA: Diagnosis not present

## 2016-09-06 DIAGNOSIS — M2550 Pain in unspecified joint: Secondary | ICD-10-CM | POA: Diagnosis not present

## 2016-09-06 DIAGNOSIS — L97419 Non-pressure chronic ulcer of right heel and midfoot with unspecified severity: Secondary | ICD-10-CM | POA: Diagnosis not present

## 2016-09-06 DIAGNOSIS — M7582 Other shoulder lesions, left shoulder: Secondary | ICD-10-CM | POA: Diagnosis not present

## 2016-09-06 DIAGNOSIS — F339 Major depressive disorder, recurrent, unspecified: Secondary | ICD-10-CM | POA: Diagnosis not present

## 2016-09-06 DIAGNOSIS — N400 Enlarged prostate without lower urinary tract symptoms: Secondary | ICD-10-CM | POA: Diagnosis not present

## 2016-09-06 DIAGNOSIS — E669 Obesity, unspecified: Secondary | ICD-10-CM | POA: Diagnosis not present

## 2016-09-06 DIAGNOSIS — M6281 Muscle weakness (generalized): Secondary | ICD-10-CM | POA: Diagnosis not present

## 2016-09-06 DIAGNOSIS — G4733 Obstructive sleep apnea (adult) (pediatric): Secondary | ICD-10-CM | POA: Diagnosis not present

## 2016-09-06 DIAGNOSIS — G589 Mononeuropathy, unspecified: Secondary | ICD-10-CM | POA: Diagnosis not present

## 2016-09-06 DIAGNOSIS — S86191A Other injury of other muscle(s) and tendon(s) of posterior muscle group at lower leg level, right leg, initial encounter: Secondary | ICD-10-CM | POA: Diagnosis not present

## 2016-09-06 DIAGNOSIS — S8991XA Unspecified injury of right lower leg, initial encounter: Secondary | ICD-10-CM

## 2016-09-06 DIAGNOSIS — E1140 Type 2 diabetes mellitus with diabetic neuropathy, unspecified: Secondary | ICD-10-CM | POA: Diagnosis not present

## 2016-09-06 DIAGNOSIS — N3281 Overactive bladder: Secondary | ICD-10-CM | POA: Diagnosis not present

## 2016-09-06 DIAGNOSIS — E119 Type 2 diabetes mellitus without complications: Secondary | ICD-10-CM | POA: Diagnosis not present

## 2016-09-06 DIAGNOSIS — R262 Difficulty in walking, not elsewhere classified: Secondary | ICD-10-CM | POA: Diagnosis not present

## 2016-09-06 DIAGNOSIS — G8911 Acute pain due to trauma: Secondary | ICD-10-CM | POA: Diagnosis not present

## 2016-09-06 DIAGNOSIS — M25561 Pain in right knee: Secondary | ICD-10-CM | POA: Diagnosis not present

## 2016-09-06 DIAGNOSIS — G2581 Restless legs syndrome: Secondary | ICD-10-CM | POA: Diagnosis not present

## 2016-09-06 DIAGNOSIS — M79604 Pain in right leg: Secondary | ICD-10-CM | POA: Diagnosis not present

## 2016-09-06 DIAGNOSIS — D291 Benign neoplasm of prostate: Secondary | ICD-10-CM | POA: Diagnosis not present

## 2016-09-06 DIAGNOSIS — E0840 Diabetes mellitus due to underlying condition with diabetic neuropathy, unspecified: Secondary | ICD-10-CM | POA: Diagnosis not present

## 2016-09-06 DIAGNOSIS — I482 Chronic atrial fibrillation: Secondary | ICD-10-CM | POA: Diagnosis not present

## 2016-09-06 DIAGNOSIS — E039 Hypothyroidism, unspecified: Secondary | ICD-10-CM | POA: Diagnosis not present

## 2016-09-06 DIAGNOSIS — I4891 Unspecified atrial fibrillation: Secondary | ICD-10-CM | POA: Diagnosis not present

## 2016-09-06 DIAGNOSIS — I10 Essential (primary) hypertension: Secondary | ICD-10-CM | POA: Diagnosis not present

## 2016-09-06 DIAGNOSIS — G4730 Sleep apnea, unspecified: Secondary | ICD-10-CM | POA: Diagnosis not present

## 2016-09-06 DIAGNOSIS — S93401A Sprain of unspecified ligament of right ankle, initial encounter: Secondary | ICD-10-CM

## 2016-09-06 DIAGNOSIS — R5381 Other malaise: Secondary | ICD-10-CM | POA: Diagnosis not present

## 2016-09-06 NOTE — ED Notes (Signed)
Pt able to stand up, pt reports he ambulates with a walker

## 2016-09-06 NOTE — ED Notes (Signed)
Pt reports he was attempting to get in the [place] yesterday to go to [facility] and rehab when his R leg went under him and he fell.  Pt reports R knee and ankle pain.  Unable to bear weight d/t pain.

## 2016-09-06 NOTE — Discharge Instructions (Signed)
Ice and elevate your ankle and knee. Ace wrap as needed. Continue your pain medications. Follow-up with family doctor for recheck. X-rays of the knee and ankle did not show any acute fractures.

## 2016-09-06 NOTE — ED Notes (Signed)
Bed: WTR6 Expected date:  Expected time:  Means of arrival:  Comments: 

## 2016-09-06 NOTE — ED Provider Notes (Signed)
[facility] DEPT Provider Note   CSN: [id number] Arrival date & time: 09/05/16  1912     History   Chief Complaint Chief Complaint  Patient presents with  . Knee Pain    HPI Brian Richmond is a 61 y.o. male.  HPI Brian Richmond is a 60 y.o. malePresents to emergency department complaining of right ankle and right knee injury after falling down earlier today. Patient states that he was getting into his [employer], when his right knee gave out. He states he was holding onto the side arm of the [employer], so he did not fall to the ground. He did. He did not hit his head. He denies pain to his back. He reports pain to his right knee and right ankle, unable to ambulate. Patient states he is on Coumadin, last INR check was yesterday and was 2.2. Patient is on chronic pain medications. The treatment prior to coming in.  Past Medical History:  Diagnosis Date  . Carpal tunnel syndrome    bilateral, and neuropathy bilateral feet  . Colon cancer ([place]) 03/2008   surgery only  . Diabetes mellitus without complication ([place])   . Hx of seasonal allergies   . Hypertension   . Neuropathy   . Obesity    BMI 68.0 kg  . OSA (obstructive sleep apnea)    uses cpap nightly  . Reduced mobility    06-27-13 states can transfer self.limited on distances-uses walker and wheelchair mostly.    Patient Active Problem List   Diagnosis Date Noted  . Long term (current) use of anticoagulants [Z79.01] 08/29/2016  . Internal hemorrhoid, bleeding 08/28/2016  . Morbid obesity with body mass index (BMI) of 60.0 to 69.9 in adult ([place]) 08/28/2016  . Restless leg syndrome 08/28/2016  . HTN (hypertension) 08/24/2016  . Rectal bleeding 08/24/2016  . Hypothyroidism 08/24/2016  . BPH (benign prostatic hyperplasia) 08/24/2016  . Atrial fibrillation with RVR ([place]) 08/24/2016  . Constipation 08/24/2016  . Right leg numbness 08/24/2016  . Generalized weakness 08/24/2016  . New onset atrial fibrillation ([place]) 08/24/2016  .  Positive D dimer 08/24/2016  . Atrial fibrillation with rapid ventricular response ([place])   . Chronic pain of both knees 06/26/2016  . Neck pain 06/26/2016  . Arthralgia of right acromioclavicular joint 10/15/2013  . Diabetes mellitus with diabetic neuropathy ([place]) 02/19/2013  . Depression 05/22/2011  . Morbid obesity ([place]) 05/22/2011  . Lumbar spondylosis 03/22/2011  . Osteoarthritis of left knee 03/22/2011  . Osteoarthritis of right knee 03/22/2011  . OSA (obstructive sleep apnea) 12/06/2010    Past Surgical History:  Procedure Laterality Date  . COLON SURGERY  03/2008  . COLONOSCOPY WITH PROPOFOL N/A 07/08/2013   Procedure: COLONOSCOPY WITH PROPOFOL;  Surgeon: [name], MD;  Location: [facility] END[facility]COPY;  Service: Endoscopy;  Laterality: N/A;  . COLONOSCOPY WITH PROPOFOL N/A 10/13/2014   Procedure: COLONOSCOPY WITH PROPOFOL;  Surgeon: [name], MD;  Location: [facility] ENDOSCOPY;  Service: Endoscopy;  Laterality: N/A;  . FLEXIBLE SIGMOIDOSCOPY N/A 08/26/2016   Procedure: FLEXIBLE SIGMOIDOSCOPY;  Surgeon: Otis Brace, MD;  Location: [facility];  Service: Gastroenterology;  Laterality: N/A;  . FOOT SURGERY     ingrown toenails        Home Medications    Prior to Admission medications   Medication Sig Start Date End Date Taking? Authorizing Provider  bisacodyl (DULCOLAX) 5 MG EC tablet Take 2 tablets (10 mg total) by mouth daily as needed for moderate constipation. 08/28/16   Dhungel,  Nishant, MD  cyclobenzaprine (FLEXERIL) 10 MG tablet Take 1 tablet (10 mg total) by mouth 3 (three) times daily as needed for muscle spasms. 06/26/16   Leandrew Koyanagi, MD  DULoxetine (CYMBALTA) 30 MG capsule TAKE 1 CAPSULE EVERY DAY Patient taking differently: Take 30 mg by mouth at bedtime 07/11/16   Meredith Staggers, MD  gabapentin (NEURONTIN) 100 MG capsule TAKE 1 CAPSULE(100 MG) BY MOUTH THREE TIMES DAILY Patient taking differently: Take 100 mg by mouth two times a day (MORNING and EVENING)  08/23/16   Bayard Hugger, NP  glimepiride (AMARYL) 2 MG tablet Take 3 mg by mouth at bedtime.  08/22/16   [provider]  hydrocortisone (ANUSOL-HC) 25 MG suppository Place 1 suppository (25 mg total) rectally 2 (two) times daily. 08/28/16   Dhungel, Nishant, MD  levothyroxine (SYNTHROID, LEVOTHROID) 25 MCG tablet Take 2 tablets (50 mcg total) by mouth daily. 08/28/16   Dhungel, Flonnie Overman, MD  lisinopril (PRINIVIL,ZESTRIL) 10 MG tablet Take 10 mg by mouth every morning.  06/11/12   [provider]  metFORMIN (GLUCOPHAGE-XR) 500 MG 24 hr tablet Take 500 mg by mouth 2 (two) times daily.  04/14/12   [provider]  metoprolol tartrate (LOPRESSOR) 25 MG tablet Take 1 tablet (25 mg total) by mouth 2 (two) times daily. 08/28/16   Dhungel, Nishant, MD  morphine (MS CONTIN) 30 MG 12 hr tablet Take 1 tablet (30 mg total) by mouth 3 (three) times daily. 07/03/16   Bayard Hugger, NP  nystatin (MYCOSTATIN) 100000 UNIT/ML suspension SWISH AND SWALLOW 5 ml's once a day as needed for thrush 09/02/15   [provider]  oxybutynin (DITROPAN) 5 MG tablet Take 5 mg by mouth 3 (three) times daily.  01/31/13   [provider]  oxyCODONE-acetaminophen (PERCOCET) 7.5-325 MG tablet Take 1 tablet by mouth every 6 (six) hours as needed. Patient taking differently: Take 1 tablet by mouth 3 (three) times daily.  07/03/16   Bayard Hugger, NP  polyethylene glycol (MIRALAX / GLYCOLAX) packet Take 17 g by mouth 2 (two) times daily. 08/28/16   Dhungel, Flonnie Overman, MD  pramipexole (MIRAPEX) 0.125 MG tablet Take 1 tablet (0.125 mg total) by mouth at bedtime. 08/28/16   Dhungel, Flonnie Overman, MD  pseudoephedrine (SUDAFED) 30 MG tablet Take 30 mg by mouth every 6 (six) hours as needed for congestion.     [provider]  senna-docusate (SENOKOT-S) 8.6-50 MG tablet Take 2 tablets by mouth 2 (two) times daily. 08/28/16   Dhungel, Flonnie Overman, MD  Tamsulosin HCl (FLOMAX) 0.4 MG CAPS Take 0.4 mg by mouth  daily.  11/25/11   [provider]  warfarin (COUMADIN) 7.5 MG tablet Take 1 tablet (7.5 mg total) by mouth daily at 6 PM. 08/28/16 08/28/17  Dhungel, Flonnie Overman, MD    Family History Family History  Problem Relation Age of Onset  . Ovarian cancer Mother   . Cancer Mother   . Heart disease Mother   . Arthritis Father     Social History Social History  Substance Use Topics  . Smoking status: Never Smoker  . Smokeless tobacco: Never Used  . Alcohol use No     Allergies   Patient has no known allergies.   Review of Systems Review of Systems  Constitutional: Negative for chills and fever.  Musculoskeletal: Positive for arthralgias, joint swelling and myalgias.  Neurological: Negative for weakness, numbness and headaches.     Physical Exam Updated Vital Signs BP (!) 150/125 (BP Location:  Left Arm) Comment: Pt has not taken his metoprolol this evening  Pulse (!) 110   Temp 97.8 F (36.6 C) (Oral)   Resp 16   Ht 5\' 9"  (1.753 m)   Wt (!) 196 kg (432 lb)   SpO2 100%   BMI 63.80 kg/m   Physical Exam  Constitutional: He appears well-developed and well-nourished. No distress.  HENT:  Head: Normocephalic and atraumatic.  Eyes: Conjunctivae are normal.  Neck: Neck supple.  Cardiovascular: Normal rate and normal heart sounds.   Irregular rhythm  Pulmonary/Chest: Effort normal. No respiratory distress. He has no wheezes. He has no rales.  Musculoskeletal: He exhibits no edema.  Mild swelling noted to the knee. No bruising. Tenderness to palpation over medial lateral joint. No tenderness to anterior or posterior knee. Full range of motion of the knee, with small amount of pain. Unable to assess stability due to patient's body habitus. Mild swelling noted to the right lateral ankle. It is to palpation over lateral malleolus. Full range of motion of the ankle, with some pain. Achilles tendon intact. Foot unremarkable.  Neurological: He is alert.  Skin: Skin is warm and dry.   Nursing note and vitals reviewed.    ED Treatments / Results  Labs (all labs ordered are listed, but only abnormal results are displayed) Labs Reviewed - No data to display  EKG  EKG Interpretation None       Radiology Dg Ankle Complete Right  Result Date: 09/05/2016 CLINICAL DATA:  Pain following fall EXAM: RIGHT ANKLE - COMPLETE 3+ VIEW COMPARISON:  None. FINDINGS: Frontal, oblique, and lateral views were obtained. There is generalized soft tissue swelling. There is no appreciable acute fracture or joint effusion. Calcification in the medial malleolus may represent residua of old trauma. There is narrowing in the medial malleolar region. No erosive change. Ankle mortise appears intact. There are posterior inferior calcaneal spurs. There is plantar calcification posterior to the mid calcaneus. There are multiple foci of arterial vascular calcification as well as multiple phleboliths. IMPRESSION: Diffuse soft tissue swelling. No acute fracture or joint effusion. Ankle mortise appears intact. Areas of osteoarthritic change medially. Calcification of the medial malleolus may have arthropathic etiology but could represent residua of old trauma. There are calcaneal spurs. There is plantar calcification. There is extensive arterial vascular calcification as well as multiple calcified phleboliths in the lower extremity. Electronically Signed   By: Lowella Grip III M.D.   On: 09/05/2016 20:05   Dg Knee Complete 4 Views Right  Result Date: 09/05/2016 CLINICAL DATA:  Status post fall with right knee pain. EXAM: RIGHT KNEE - COMPLETE 4+ VIEW COMPARISON:  None. FINDINGS: No evidence of fracture, dislocation, or joint effusion. Advanced 3 compartment osteoarthritic changes of the right knee with joint space narrowing, subchondral sclerosis and remodeling and exuberant osteophytosis. IMPRESSION: No acute fracture or dislocation identified about the right Knee. Advanced 3 compartment osteoarthritic  changes of the right knee. Please note that a subtle tibial plateau fracture would be easily overlooked in the settings of advanced osteoarthritic changes and bony remodeling. Electronically Signed   By: Fidela Salisbury M.D.   On: 09/05/2016 20:06    Procedures Procedures (including critical care time)  Medications Ordered in ED Medications - No data to display   Initial Impression / Assessment and Plan / ED Course  I have reviewed the triage vital signs and the nursing notes.  Pertinent labs & imaging results that were available during my care of the patient were reviewed  by me and considered in my medical decision making (see chart for details).     Patient with mechanical fall, complaining of right knee and ankle pain. X-rays are negative. Patient was able to get up and bear weight on his leg. He is complaining of more pain to his ankle than his knee, although as advised by radiology, subtle tibial plateau fracture can be overlooked, I doubt since patient is having more pain to his ankle and he is able to bear weight on that leg in the department. Patient normally states he uses motorized scooter to get around, and only walks with a walker for short distances. He is actually on his way to a rehabilitation facility for further treatment of his cardiac issues and difficulty with mobility. I have advised him to ice his knee, elevate, avoid weight bearing. He continues to have severe pain, he will need outpatient CT scan of that knee to rule out fracture. PT agreed. Stable for dc home at this time Neurovascularly intact at time of disharge.   Vitals:   09/05/16 1929 09/05/16 1931 09/06/16 0005 09/06/16 0121  BP: 127/77  (!) 150/125 (!) 119/94  Pulse: 62  (!) 110 95  Resp: 20  16 16   Temp: 97.8 F (36.6 C)     TempSrc: Oral     SpO2: 95%  100% 100%  Weight:  (!) 196 kg (432 lb)    Height:  5\' 9"  (1.753 m)       Final Clinical Impressions(s) / ED Diagnoses   Final diagnoses:    Injury of right knee, initial encounter  Sprain of right ankle, unspecified ligament, initial encounter    New Prescriptions Discharge Medication List as of 09/06/2016  1:38 AM       Jeannett Senior, PA-C 09/06/16 1694    Rolland Porter, MD 09/06/16 [phone]

## 2016-09-06 NOTE — ED Notes (Signed)
[facility] contacted for transport to [facility] and rehab

## 2016-09-07 DIAGNOSIS — M6281 Muscle weakness (generalized): Secondary | ICD-10-CM | POA: Diagnosis not present

## 2016-09-07 DIAGNOSIS — I4891 Unspecified atrial fibrillation: Secondary | ICD-10-CM | POA: Diagnosis not present

## 2016-09-07 DIAGNOSIS — D291 Benign neoplasm of prostate: Secondary | ICD-10-CM | POA: Diagnosis not present

## 2016-09-07 DIAGNOSIS — E0840 Diabetes mellitus due to underlying condition with diabetic neuropathy, unspecified: Secondary | ICD-10-CM | POA: Diagnosis not present

## 2016-09-07 DIAGNOSIS — E669 Obesity, unspecified: Secondary | ICD-10-CM | POA: Diagnosis not present

## 2016-09-08 DIAGNOSIS — E119 Type 2 diabetes mellitus without complications: Secondary | ICD-10-CM | POA: Diagnosis not present

## 2016-09-08 DIAGNOSIS — M79604 Pain in right leg: Secondary | ICD-10-CM | POA: Diagnosis not present

## 2016-09-08 DIAGNOSIS — I482 Chronic atrial fibrillation: Secondary | ICD-10-CM | POA: Diagnosis not present

## 2016-09-11 ENCOUNTER — Encounter: Payer: Medicare HMO | Admitting: Registered Nurse

## 2016-09-13 DIAGNOSIS — R5381 Other malaise: Secondary | ICD-10-CM | POA: Diagnosis not present

## 2016-09-13 DIAGNOSIS — M25561 Pain in right knee: Secondary | ICD-10-CM | POA: Diagnosis not present

## 2016-09-13 DIAGNOSIS — R262 Difficulty in walking, not elsewhere classified: Secondary | ICD-10-CM | POA: Diagnosis not present

## 2016-09-13 DIAGNOSIS — M7582 Other shoulder lesions, left shoulder: Secondary | ICD-10-CM | POA: Diagnosis not present

## 2016-09-14 ENCOUNTER — Telehealth (INDEPENDENT_AMBULATORY_CARE_PROVIDER_SITE_OTHER): Payer: Self-pay

## 2016-09-14 DIAGNOSIS — L97419 Non-pressure chronic ulcer of right heel and midfoot with unspecified severity: Secondary | ICD-10-CM | POA: Diagnosis not present

## 2016-09-14 NOTE — Telephone Encounter (Signed)
Brian Richmond with [employer] stated that she will fax over some information to approve power chair.  Cb# is [phone].

## 2016-09-20 ENCOUNTER — Telehealth (INDEPENDENT_AMBULATORY_CARE_PROVIDER_SITE_OTHER): Payer: Self-pay | Admitting: Orthopaedic Surgery

## 2016-09-20 DIAGNOSIS — R5381 Other malaise: Secondary | ICD-10-CM | POA: Diagnosis not present

## 2016-09-20 DIAGNOSIS — M7582 Other shoulder lesions, left shoulder: Secondary | ICD-10-CM | POA: Diagnosis not present

## 2016-09-20 DIAGNOSIS — R262 Difficulty in walking, not elsewhere classified: Secondary | ICD-10-CM | POA: Diagnosis not present

## 2016-09-20 DIAGNOSIS — M25561 Pain in right knee: Secondary | ICD-10-CM | POA: Diagnosis not present

## 2016-09-20 NOTE — Telephone Encounter (Signed)
Anderson Malta from [facility] called advised she faxed over request for a power wheelchair 09/14/16. She advised need Rx updated and form signed by Dr Erlinda Hong. The number to contact Anderson Malta is [phone]

## 2016-09-21 DIAGNOSIS — L97419 Non-pressure chronic ulcer of right heel and midfoot with unspecified severity: Secondary | ICD-10-CM | POA: Diagnosis not present

## 2016-09-21 NOTE — Telephone Encounter (Signed)
Faxed form through cassandra's computer

## 2016-09-22 NOTE — Telephone Encounter (Signed)
FAXED ORDER  

## 2016-09-22 NOTE — Telephone Encounter (Signed)
Anderson Malta gave a different fax number, said the fax sent over yesterday for the power wheelchair wasn't received. Fax # [phone]

## 2016-09-27 ENCOUNTER — Other Ambulatory Visit: Payer: Self-pay | Admitting: Pharmacist

## 2016-09-27 NOTE — Patient Outreach (Signed)
Outreach call to Brian Richmond regarding his request for follow up from the [facility] Medication Adherence Campaign. Left a HIPAA compliant message on the patient's voicemail.   Harlow Asa, PharmD, [facility] [phone]

## 2016-09-28 DIAGNOSIS — L97419 Non-pressure chronic ulcer of right heel and midfoot with unspecified severity: Secondary | ICD-10-CM | POA: Diagnosis not present

## 2016-10-06 DIAGNOSIS — E0840 Diabetes mellitus due to underlying condition with diabetic neuropathy, unspecified: Secondary | ICD-10-CM | POA: Diagnosis not present

## 2016-10-06 DIAGNOSIS — I4891 Unspecified atrial fibrillation: Secondary | ICD-10-CM | POA: Diagnosis not present

## 2016-10-06 DIAGNOSIS — F339 Major depressive disorder, recurrent, unspecified: Secondary | ICD-10-CM | POA: Diagnosis not present

## 2016-10-06 DIAGNOSIS — G2581 Restless legs syndrome: Secondary | ICD-10-CM | POA: Diagnosis not present

## 2016-10-06 DIAGNOSIS — N400 Enlarged prostate without lower urinary tract symptoms: Secondary | ICD-10-CM | POA: Diagnosis not present

## 2016-10-06 DIAGNOSIS — G4733 Obstructive sleep apnea (adult) (pediatric): Secondary | ICD-10-CM | POA: Diagnosis not present

## 2016-10-06 DIAGNOSIS — M6281 Muscle weakness (generalized): Secondary | ICD-10-CM | POA: Diagnosis not present

## 2016-10-09 DIAGNOSIS — G589 Mononeuropathy, unspecified: Secondary | ICD-10-CM | POA: Diagnosis not present

## 2016-10-09 DIAGNOSIS — G4730 Sleep apnea, unspecified: Secondary | ICD-10-CM | POA: Diagnosis not present

## 2016-10-09 DIAGNOSIS — E1140 Type 2 diabetes mellitus with diabetic neuropathy, unspecified: Secondary | ICD-10-CM | POA: Diagnosis not present

## 2016-10-09 DIAGNOSIS — G609 Hereditary and idiopathic neuropathy, unspecified: Secondary | ICD-10-CM | POA: Diagnosis not present

## 2016-10-09 DIAGNOSIS — G4733 Obstructive sleep apnea (adult) (pediatric): Secondary | ICD-10-CM | POA: Diagnosis not present

## 2016-10-09 DIAGNOSIS — M2550 Pain in unspecified joint: Secondary | ICD-10-CM | POA: Diagnosis not present

## 2016-10-11 DIAGNOSIS — M25561 Pain in right knee: Secondary | ICD-10-CM | POA: Diagnosis not present

## 2016-10-11 DIAGNOSIS — M7582 Other shoulder lesions, left shoulder: Secondary | ICD-10-CM | POA: Diagnosis not present

## 2016-10-11 DIAGNOSIS — R5381 Other malaise: Secondary | ICD-10-CM | POA: Diagnosis not present

## 2016-10-11 DIAGNOSIS — R262 Difficulty in walking, not elsewhere classified: Secondary | ICD-10-CM | POA: Diagnosis not present

## 2016-10-12 DIAGNOSIS — L97419 Non-pressure chronic ulcer of right heel and midfoot with unspecified severity: Secondary | ICD-10-CM | POA: Diagnosis not present

## 2016-10-13 DIAGNOSIS — M65812 Other synovitis and tenosynovitis, left shoulder: Secondary | ICD-10-CM | POA: Diagnosis not present

## 2016-10-17 DIAGNOSIS — R262 Difficulty in walking, not elsewhere classified: Secondary | ICD-10-CM | POA: Diagnosis not present

## 2016-10-17 DIAGNOSIS — M25561 Pain in right knee: Secondary | ICD-10-CM | POA: Diagnosis not present

## 2016-10-17 DIAGNOSIS — R5381 Other malaise: Secondary | ICD-10-CM | POA: Diagnosis not present

## 2016-10-17 DIAGNOSIS — M7582 Other shoulder lesions, left shoulder: Secondary | ICD-10-CM | POA: Diagnosis not present

## 2016-10-18 ENCOUNTER — Telehealth (INDEPENDENT_AMBULATORY_CARE_PROVIDER_SITE_OTHER): Payer: Self-pay | Admitting: Orthopaedic Surgery

## 2016-10-18 DIAGNOSIS — G8929 Other chronic pain: Secondary | ICD-10-CM

## 2016-10-18 DIAGNOSIS — M25561 Pain in right knee: Principal | ICD-10-CM

## 2016-10-18 NOTE — Telephone Encounter (Signed)
Patient called advised he fell 09/05/16 hurting his right knee. Patient advised he went to [facility]. Patient asked if Dr Erlinda Hong would review his X-rays and call him back. Patient said he is still having a lot of pain in his knee. Patient said he can not stand up on it yet. Patient said he is still in rehab. Patient said he is in [facility] on [address]. The number to contact patient is [phone]

## 2016-10-18 NOTE — Telephone Encounter (Signed)
See message below °

## 2016-10-19 NOTE — Telephone Encounter (Signed)
Order made

## 2016-10-19 NOTE — Telephone Encounter (Signed)
Wound recommend CT scan to r/o fracture of the knee

## 2016-10-23 ENCOUNTER — Telehealth (INDEPENDENT_AMBULATORY_CARE_PROVIDER_SITE_OTHER): Payer: Self-pay | Admitting: *Deleted

## 2016-10-23 NOTE — Telephone Encounter (Signed)
[facility] and sw Mardene Celeste to schedule CT scan, we go to the questions per protocol that I could not answer so ti contacted pt and left vm for him to return my call. I need to find out if he is able to stand, walk, in a wheelchair and how he is suppose to get to the imaging,etc ambulance.

## 2016-10-31 NOTE — Telephone Encounter (Signed)
Pt called back left vm to return his call, IC back left vm for him to return my call.

## 2016-11-02 DIAGNOSIS — I4891 Unspecified atrial fibrillation: Secondary | ICD-10-CM | POA: Diagnosis not present

## 2016-11-02 DIAGNOSIS — M6281 Muscle weakness (generalized): Secondary | ICD-10-CM | POA: Diagnosis not present

## 2016-11-03 DIAGNOSIS — I4891 Unspecified atrial fibrillation: Secondary | ICD-10-CM | POA: Diagnosis not present

## 2016-11-03 DIAGNOSIS — M6281 Muscle weakness (generalized): Secondary | ICD-10-CM | POA: Diagnosis not present

## 2016-11-06 DIAGNOSIS — M6281 Muscle weakness (generalized): Secondary | ICD-10-CM | POA: Diagnosis not present

## 2016-11-06 DIAGNOSIS — I4891 Unspecified atrial fibrillation: Secondary | ICD-10-CM | POA: Diagnosis not present

## 2016-11-07 DIAGNOSIS — M6281 Muscle weakness (generalized): Secondary | ICD-10-CM | POA: Diagnosis not present

## 2016-11-07 DIAGNOSIS — I4891 Unspecified atrial fibrillation: Secondary | ICD-10-CM | POA: Diagnosis not present

## 2016-11-08 DIAGNOSIS — M2550 Pain in unspecified joint: Secondary | ICD-10-CM | POA: Diagnosis not present

## 2016-11-08 DIAGNOSIS — G589 Mononeuropathy, unspecified: Secondary | ICD-10-CM | POA: Diagnosis not present

## 2016-11-08 DIAGNOSIS — M6281 Muscle weakness (generalized): Secondary | ICD-10-CM | POA: Diagnosis not present

## 2016-11-08 DIAGNOSIS — E1140 Type 2 diabetes mellitus with diabetic neuropathy, unspecified: Secondary | ICD-10-CM | POA: Diagnosis not present

## 2016-11-08 DIAGNOSIS — G4733 Obstructive sleep apnea (adult) (pediatric): Secondary | ICD-10-CM | POA: Diagnosis not present

## 2016-11-08 DIAGNOSIS — G4730 Sleep apnea, unspecified: Secondary | ICD-10-CM | POA: Diagnosis not present

## 2016-11-08 DIAGNOSIS — G609 Hereditary and idiopathic neuropathy, unspecified: Secondary | ICD-10-CM | POA: Diagnosis not present

## 2016-11-08 DIAGNOSIS — I4891 Unspecified atrial fibrillation: Secondary | ICD-10-CM | POA: Diagnosis not present

## 2016-11-09 DIAGNOSIS — M6281 Muscle weakness (generalized): Secondary | ICD-10-CM | POA: Diagnosis not present

## 2016-11-09 DIAGNOSIS — I4891 Unspecified atrial fibrillation: Secondary | ICD-10-CM | POA: Diagnosis not present

## 2016-11-10 DIAGNOSIS — I4891 Unspecified atrial fibrillation: Secondary | ICD-10-CM | POA: Diagnosis not present

## 2016-11-10 DIAGNOSIS — M6281 Muscle weakness (generalized): Secondary | ICD-10-CM | POA: Diagnosis not present

## 2016-11-13 DIAGNOSIS — I4891 Unspecified atrial fibrillation: Secondary | ICD-10-CM | POA: Diagnosis not present

## 2016-11-13 DIAGNOSIS — M6281 Muscle weakness (generalized): Secondary | ICD-10-CM | POA: Diagnosis not present

## 2016-11-14 DIAGNOSIS — I4891 Unspecified atrial fibrillation: Secondary | ICD-10-CM | POA: Diagnosis not present

## 2016-11-14 DIAGNOSIS — M6281 Muscle weakness (generalized): Secondary | ICD-10-CM | POA: Diagnosis not present

## 2016-11-14 NOTE — Telephone Encounter (Signed)
Pt called back left vm, I returned call, left message.

## 2016-11-15 DIAGNOSIS — M6281 Muscle weakness (generalized): Secondary | ICD-10-CM | POA: Diagnosis not present

## 2016-11-15 DIAGNOSIS — I4891 Unspecified atrial fibrillation: Secondary | ICD-10-CM | POA: Diagnosis not present

## 2016-11-16 DIAGNOSIS — M6281 Muscle weakness (generalized): Secondary | ICD-10-CM | POA: Diagnosis not present

## 2016-11-16 DIAGNOSIS — I4891 Unspecified atrial fibrillation: Secondary | ICD-10-CM | POA: Diagnosis not present

## 2016-11-17 DIAGNOSIS — M6281 Muscle weakness (generalized): Secondary | ICD-10-CM | POA: Diagnosis not present

## 2016-11-17 DIAGNOSIS — I4891 Unspecified atrial fibrillation: Secondary | ICD-10-CM | POA: Diagnosis not present

## 2016-11-20 DIAGNOSIS — I4891 Unspecified atrial fibrillation: Secondary | ICD-10-CM | POA: Diagnosis not present

## 2016-11-20 DIAGNOSIS — M6281 Muscle weakness (generalized): Secondary | ICD-10-CM | POA: Diagnosis not present

## 2016-11-21 DIAGNOSIS — M6281 Muscle weakness (generalized): Secondary | ICD-10-CM | POA: Diagnosis not present

## 2016-11-21 DIAGNOSIS — I4891 Unspecified atrial fibrillation: Secondary | ICD-10-CM | POA: Diagnosis not present

## 2016-11-22 DIAGNOSIS — I4891 Unspecified atrial fibrillation: Secondary | ICD-10-CM | POA: Diagnosis not present

## 2016-11-22 DIAGNOSIS — M6281 Muscle weakness (generalized): Secondary | ICD-10-CM | POA: Diagnosis not present

## 2016-11-22 NOTE — Telephone Encounter (Signed)
I have left numerous message for this pt to contact me, he has called and left vm, we are playing phone tag. I will try to contact again.

## 2016-11-23 DIAGNOSIS — I4891 Unspecified atrial fibrillation: Secondary | ICD-10-CM | POA: Diagnosis not present

## 2016-11-23 DIAGNOSIS — M6281 Muscle weakness (generalized): Secondary | ICD-10-CM | POA: Diagnosis not present

## 2016-11-24 DIAGNOSIS — M6281 Muscle weakness (generalized): Secondary | ICD-10-CM | POA: Diagnosis not present

## 2016-11-24 DIAGNOSIS — I4891 Unspecified atrial fibrillation: Secondary | ICD-10-CM | POA: Diagnosis not present

## 2016-11-27 DIAGNOSIS — I4891 Unspecified atrial fibrillation: Secondary | ICD-10-CM | POA: Diagnosis not present

## 2016-11-27 DIAGNOSIS — M6281 Muscle weakness (generalized): Secondary | ICD-10-CM | POA: Diagnosis not present

## 2016-11-28 DIAGNOSIS — F339 Major depressive disorder, recurrent, unspecified: Secondary | ICD-10-CM | POA: Diagnosis not present

## 2016-11-28 DIAGNOSIS — G4733 Obstructive sleep apnea (adult) (pediatric): Secondary | ICD-10-CM | POA: Diagnosis not present

## 2016-11-28 DIAGNOSIS — D291 Benign neoplasm of prostate: Secondary | ICD-10-CM | POA: Diagnosis not present

## 2016-11-28 DIAGNOSIS — E0840 Diabetes mellitus due to underlying condition with diabetic neuropathy, unspecified: Secondary | ICD-10-CM | POA: Diagnosis not present

## 2016-11-28 DIAGNOSIS — M6281 Muscle weakness (generalized): Secondary | ICD-10-CM | POA: Diagnosis not present

## 2016-11-28 DIAGNOSIS — I4891 Unspecified atrial fibrillation: Secondary | ICD-10-CM | POA: Diagnosis not present

## 2016-11-29 ENCOUNTER — Telehealth: Payer: Self-pay

## 2016-11-29 DIAGNOSIS — M6281 Muscle weakness (generalized): Secondary | ICD-10-CM | POA: Diagnosis not present

## 2016-11-29 DIAGNOSIS — I4891 Unspecified atrial fibrillation: Secondary | ICD-10-CM | POA: Diagnosis not present

## 2016-11-29 NOTE — Telephone Encounter (Signed)
I return Brian Richmond call, he is staying at [facility] [address], they are prescribing his medication. He is aware once he is discharged from the facility we can resume prescribing his medication. He verbalizes understanding.

## 2016-11-29 NOTE — Telephone Encounter (Signed)
Brian Richmond called today, wanted to inform us that for the last 4-6 months he has been in and out of the ER and Rehab facility's which is why he hasn't been in here in so long.   Stated that he has been having his medications prescribed by the people over at the rehab facility's but has been having increased shoulder pain due to him not being allowed to use his power chair.  Currently thou he has developed leg and ankle pain due to a twisted ankle while using a walker so is now completely immobilized and bed written so he can only go around by a manual wheel chair.   States has been trying to figure a way to come here to this clinic to be seen but is consistently being told that his manual chair is too large to fit in a transportation [place].  States is still trying to figure out a way out of this predicament but will let us know when his situation changes and he can come back and see either Dr. Naaman Plummer or Zella Ball for his management.

## 2016-11-30 DIAGNOSIS — E119 Type 2 diabetes mellitus without complications: Secondary | ICD-10-CM | POA: Diagnosis not present

## 2016-11-30 DIAGNOSIS — M6281 Muscle weakness (generalized): Secondary | ICD-10-CM | POA: Diagnosis not present

## 2016-11-30 DIAGNOSIS — F339 Major depressive disorder, recurrent, unspecified: Secondary | ICD-10-CM | POA: Diagnosis not present

## 2016-11-30 DIAGNOSIS — E669 Obesity, unspecified: Secondary | ICD-10-CM | POA: Diagnosis not present

## 2016-11-30 DIAGNOSIS — I4891 Unspecified atrial fibrillation: Secondary | ICD-10-CM | POA: Diagnosis not present

## 2016-12-09 DIAGNOSIS — G4733 Obstructive sleep apnea (adult) (pediatric): Secondary | ICD-10-CM | POA: Diagnosis not present

## 2016-12-09 DIAGNOSIS — M2550 Pain in unspecified joint: Secondary | ICD-10-CM | POA: Diagnosis not present

## 2016-12-09 DIAGNOSIS — E1140 Type 2 diabetes mellitus with diabetic neuropathy, unspecified: Secondary | ICD-10-CM | POA: Diagnosis not present

## 2016-12-09 DIAGNOSIS — G609 Hereditary and idiopathic neuropathy, unspecified: Secondary | ICD-10-CM | POA: Diagnosis not present

## 2016-12-09 DIAGNOSIS — G589 Mononeuropathy, unspecified: Secondary | ICD-10-CM | POA: Diagnosis not present

## 2016-12-09 DIAGNOSIS — G4730 Sleep apnea, unspecified: Secondary | ICD-10-CM | POA: Diagnosis not present

## 2016-12-11 DIAGNOSIS — N390 Urinary tract infection, site not specified: Secondary | ICD-10-CM | POA: Diagnosis not present

## 2016-12-11 DIAGNOSIS — N400 Enlarged prostate without lower urinary tract symptoms: Secondary | ICD-10-CM | POA: Diagnosis not present

## 2016-12-11 DIAGNOSIS — D291 Benign neoplasm of prostate: Secondary | ICD-10-CM | POA: Diagnosis not present

## 2016-12-11 DIAGNOSIS — E0840 Diabetes mellitus due to underlying condition with diabetic neuropathy, unspecified: Secondary | ICD-10-CM | POA: Diagnosis not present

## 2016-12-12 DIAGNOSIS — E1140 Type 2 diabetes mellitus with diabetic neuropathy, unspecified: Secondary | ICD-10-CM | POA: Diagnosis not present

## 2016-12-12 DIAGNOSIS — M2550 Pain in unspecified joint: Secondary | ICD-10-CM | POA: Diagnosis not present

## 2016-12-12 DIAGNOSIS — R531 Weakness: Secondary | ICD-10-CM | POA: Diagnosis not present

## 2016-12-12 DIAGNOSIS — G609 Hereditary and idiopathic neuropathy, unspecified: Secondary | ICD-10-CM | POA: Diagnosis not present

## 2016-12-12 DIAGNOSIS — G4730 Sleep apnea, unspecified: Secondary | ICD-10-CM | POA: Diagnosis not present

## 2016-12-12 DIAGNOSIS — G589 Mononeuropathy, unspecified: Secondary | ICD-10-CM | POA: Diagnosis not present

## 2016-12-12 DIAGNOSIS — G4733 Obstructive sleep apnea (adult) (pediatric): Secondary | ICD-10-CM | POA: Diagnosis not present

## 2016-12-15 DIAGNOSIS — N390 Urinary tract infection, site not specified: Secondary | ICD-10-CM | POA: Diagnosis not present

## 2017-01-02 DIAGNOSIS — I4891 Unspecified atrial fibrillation: Secondary | ICD-10-CM | POA: Diagnosis not present

## 2017-01-02 DIAGNOSIS — M6281 Muscle weakness (generalized): Secondary | ICD-10-CM | POA: Diagnosis not present

## 2017-01-02 DIAGNOSIS — I10 Essential (primary) hypertension: Secondary | ICD-10-CM | POA: Diagnosis not present

## 2017-01-02 DIAGNOSIS — R330 Drug induced retention of urine: Secondary | ICD-10-CM | POA: Diagnosis not present

## 2017-01-02 DIAGNOSIS — R682 Dry mouth, unspecified: Secondary | ICD-10-CM | POA: Diagnosis not present

## 2017-01-03 DIAGNOSIS — M6281 Muscle weakness (generalized): Secondary | ICD-10-CM | POA: Diagnosis not present

## 2017-01-03 DIAGNOSIS — I10 Essential (primary) hypertension: Secondary | ICD-10-CM | POA: Diagnosis not present

## 2017-01-03 DIAGNOSIS — I4891 Unspecified atrial fibrillation: Secondary | ICD-10-CM | POA: Diagnosis not present

## 2017-01-05 DIAGNOSIS — I4891 Unspecified atrial fibrillation: Secondary | ICD-10-CM | POA: Diagnosis not present

## 2017-01-05 DIAGNOSIS — M6281 Muscle weakness (generalized): Secondary | ICD-10-CM | POA: Diagnosis not present

## 2017-01-05 DIAGNOSIS — I10 Essential (primary) hypertension: Secondary | ICD-10-CM | POA: Diagnosis not present

## 2017-01-07 DIAGNOSIS — I10 Essential (primary) hypertension: Secondary | ICD-10-CM | POA: Diagnosis not present

## 2017-01-07 DIAGNOSIS — I4891 Unspecified atrial fibrillation: Secondary | ICD-10-CM | POA: Diagnosis not present

## 2017-01-07 DIAGNOSIS — M6281 Muscle weakness (generalized): Secondary | ICD-10-CM | POA: Diagnosis not present

## 2017-01-08 DIAGNOSIS — E119 Type 2 diabetes mellitus without complications: Secondary | ICD-10-CM | POA: Diagnosis not present

## 2017-01-08 DIAGNOSIS — G589 Mononeuropathy, unspecified: Secondary | ICD-10-CM | POA: Diagnosis not present

## 2017-01-08 DIAGNOSIS — E1140 Type 2 diabetes mellitus with diabetic neuropathy, unspecified: Secondary | ICD-10-CM | POA: Diagnosis not present

## 2017-01-08 DIAGNOSIS — M6281 Muscle weakness (generalized): Secondary | ICD-10-CM | POA: Diagnosis not present

## 2017-01-08 DIAGNOSIS — G609 Hereditary and idiopathic neuropathy, unspecified: Secondary | ICD-10-CM | POA: Diagnosis not present

## 2017-01-08 DIAGNOSIS — G4730 Sleep apnea, unspecified: Secondary | ICD-10-CM | POA: Diagnosis not present

## 2017-01-08 DIAGNOSIS — G4733 Obstructive sleep apnea (adult) (pediatric): Secondary | ICD-10-CM | POA: Diagnosis not present

## 2017-01-08 DIAGNOSIS — N390 Urinary tract infection, site not specified: Secondary | ICD-10-CM | POA: Diagnosis not present

## 2017-01-08 DIAGNOSIS — I10 Essential (primary) hypertension: Secondary | ICD-10-CM | POA: Diagnosis not present

## 2017-01-08 DIAGNOSIS — M2550 Pain in unspecified joint: Secondary | ICD-10-CM | POA: Diagnosis not present

## 2017-01-08 DIAGNOSIS — I4891 Unspecified atrial fibrillation: Secondary | ICD-10-CM | POA: Diagnosis not present

## 2017-01-10 DIAGNOSIS — Z5181 Encounter for therapeutic drug level monitoring: Secondary | ICD-10-CM | POA: Diagnosis not present

## 2017-01-10 DIAGNOSIS — I4891 Unspecified atrial fibrillation: Secondary | ICD-10-CM | POA: Diagnosis not present

## 2017-01-10 DIAGNOSIS — M6281 Muscle weakness (generalized): Secondary | ICD-10-CM | POA: Diagnosis not present

## 2017-01-10 DIAGNOSIS — I10 Essential (primary) hypertension: Secondary | ICD-10-CM | POA: Diagnosis not present

## 2017-01-11 DIAGNOSIS — M6281 Muscle weakness (generalized): Secondary | ICD-10-CM | POA: Diagnosis not present

## 2017-01-11 DIAGNOSIS — I10 Essential (primary) hypertension: Secondary | ICD-10-CM | POA: Diagnosis not present

## 2017-01-11 DIAGNOSIS — I4891 Unspecified atrial fibrillation: Secondary | ICD-10-CM | POA: Diagnosis not present

## 2017-01-12 DIAGNOSIS — I10 Essential (primary) hypertension: Secondary | ICD-10-CM | POA: Diagnosis not present

## 2017-01-12 DIAGNOSIS — M6281 Muscle weakness (generalized): Secondary | ICD-10-CM | POA: Diagnosis not present

## 2017-01-12 DIAGNOSIS — I4891 Unspecified atrial fibrillation: Secondary | ICD-10-CM | POA: Diagnosis not present

## 2017-01-14 DIAGNOSIS — I4891 Unspecified atrial fibrillation: Secondary | ICD-10-CM | POA: Diagnosis not present

## 2017-01-14 DIAGNOSIS — M6281 Muscle weakness (generalized): Secondary | ICD-10-CM | POA: Diagnosis not present

## 2017-01-14 DIAGNOSIS — I10 Essential (primary) hypertension: Secondary | ICD-10-CM | POA: Diagnosis not present

## 2017-01-15 DIAGNOSIS — I4891 Unspecified atrial fibrillation: Secondary | ICD-10-CM | POA: Diagnosis not present

## 2017-01-15 DIAGNOSIS — M6281 Muscle weakness (generalized): Secondary | ICD-10-CM | POA: Diagnosis not present

## 2017-01-15 DIAGNOSIS — I10 Essential (primary) hypertension: Secondary | ICD-10-CM | POA: Diagnosis not present

## 2017-01-17 DIAGNOSIS — E669 Obesity, unspecified: Secondary | ICD-10-CM | POA: Diagnosis not present

## 2017-01-17 DIAGNOSIS — K0381 Cracked tooth: Secondary | ICD-10-CM | POA: Diagnosis not present

## 2017-01-23 DIAGNOSIS — R791 Abnormal coagulation profile: Secondary | ICD-10-CM | POA: Diagnosis not present

## 2017-01-23 DIAGNOSIS — I4891 Unspecified atrial fibrillation: Secondary | ICD-10-CM | POA: Diagnosis not present

## 2017-01-23 DIAGNOSIS — E669 Obesity, unspecified: Secondary | ICD-10-CM | POA: Diagnosis not present

## 2017-01-26 DIAGNOSIS — D689 Coagulation defect, unspecified: Secondary | ICD-10-CM | POA: Diagnosis not present

## 2017-01-26 DIAGNOSIS — R799 Abnormal finding of blood chemistry, unspecified: Secondary | ICD-10-CM | POA: Diagnosis not present

## 2017-01-29 DIAGNOSIS — Z5181 Encounter for therapeutic drug level monitoring: Secondary | ICD-10-CM | POA: Diagnosis not present

## 2017-01-29 DIAGNOSIS — E119 Type 2 diabetes mellitus without complications: Secondary | ICD-10-CM | POA: Diagnosis not present

## 2017-01-31 DIAGNOSIS — R002 Palpitations: Secondary | ICD-10-CM | POA: Diagnosis not present

## 2017-01-31 DIAGNOSIS — I4891 Unspecified atrial fibrillation: Secondary | ICD-10-CM | POA: Diagnosis not present

## 2017-01-31 DIAGNOSIS — E669 Obesity, unspecified: Secondary | ICD-10-CM | POA: Diagnosis not present

## 2017-01-31 DIAGNOSIS — R9431 Abnormal electrocardiogram [ECG] [EKG]: Secondary | ICD-10-CM | POA: Diagnosis not present

## 2017-02-08 DIAGNOSIS — G4730 Sleep apnea, unspecified: Secondary | ICD-10-CM | POA: Diagnosis not present

## 2017-02-08 DIAGNOSIS — G589 Mononeuropathy, unspecified: Secondary | ICD-10-CM | POA: Diagnosis not present

## 2017-02-08 DIAGNOSIS — M7582 Other shoulder lesions, left shoulder: Secondary | ICD-10-CM | POA: Diagnosis not present

## 2017-02-08 DIAGNOSIS — M25561 Pain in right knee: Secondary | ICD-10-CM | POA: Diagnosis not present

## 2017-02-08 DIAGNOSIS — G4733 Obstructive sleep apnea (adult) (pediatric): Secondary | ICD-10-CM | POA: Diagnosis not present

## 2017-02-08 DIAGNOSIS — R262 Difficulty in walking, not elsewhere classified: Secondary | ICD-10-CM | POA: Diagnosis not present

## 2017-02-08 DIAGNOSIS — R5381 Other malaise: Secondary | ICD-10-CM | POA: Diagnosis not present

## 2017-02-08 DIAGNOSIS — E1140 Type 2 diabetes mellitus with diabetic neuropathy, unspecified: Secondary | ICD-10-CM | POA: Diagnosis not present

## 2017-02-08 DIAGNOSIS — G609 Hereditary and idiopathic neuropathy, unspecified: Secondary | ICD-10-CM | POA: Diagnosis not present

## 2017-02-08 DIAGNOSIS — M2550 Pain in unspecified joint: Secondary | ICD-10-CM | POA: Diagnosis not present

## 2017-02-10 DIAGNOSIS — M25561 Pain in right knee: Secondary | ICD-10-CM | POA: Diagnosis not present

## 2017-02-10 DIAGNOSIS — M25511 Pain in right shoulder: Secondary | ICD-10-CM | POA: Diagnosis not present

## 2017-02-10 DIAGNOSIS — M25562 Pain in left knee: Secondary | ICD-10-CM | POA: Diagnosis not present

## 2017-02-13 DIAGNOSIS — R600 Localized edema: Secondary | ICD-10-CM | POA: Diagnosis not present

## 2017-02-13 DIAGNOSIS — M6281 Muscle weakness (generalized): Secondary | ICD-10-CM | POA: Diagnosis not present

## 2017-02-13 DIAGNOSIS — R799 Abnormal finding of blood chemistry, unspecified: Secondary | ICD-10-CM | POA: Diagnosis not present

## 2017-02-13 DIAGNOSIS — E669 Obesity, unspecified: Secondary | ICD-10-CM | POA: Diagnosis not present

## 2017-02-13 DIAGNOSIS — E039 Hypothyroidism, unspecified: Secondary | ICD-10-CM | POA: Diagnosis not present

## 2017-02-14 DIAGNOSIS — M6281 Muscle weakness (generalized): Secondary | ICD-10-CM | POA: Diagnosis not present

## 2017-02-14 DIAGNOSIS — M25519 Pain in unspecified shoulder: Secondary | ICD-10-CM | POA: Diagnosis not present

## 2017-02-21 DIAGNOSIS — Z5181 Encounter for therapeutic drug level monitoring: Secondary | ICD-10-CM | POA: Diagnosis not present

## 2017-02-23 DIAGNOSIS — Z5181 Encounter for therapeutic drug level monitoring: Secondary | ICD-10-CM | POA: Diagnosis not present

## 2017-02-23 DIAGNOSIS — F339 Major depressive disorder, recurrent, unspecified: Secondary | ICD-10-CM | POA: Diagnosis not present

## 2017-02-23 DIAGNOSIS — F419 Anxiety disorder, unspecified: Secondary | ICD-10-CM | POA: Diagnosis not present

## 2017-03-11 DIAGNOSIS — G609 Hereditary and idiopathic neuropathy, unspecified: Secondary | ICD-10-CM | POA: Diagnosis not present

## 2017-03-11 DIAGNOSIS — M2550 Pain in unspecified joint: Secondary | ICD-10-CM | POA: Diagnosis not present

## 2017-03-11 DIAGNOSIS — E1140 Type 2 diabetes mellitus with diabetic neuropathy, unspecified: Secondary | ICD-10-CM | POA: Diagnosis not present

## 2017-03-11 DIAGNOSIS — G589 Mononeuropathy, unspecified: Secondary | ICD-10-CM | POA: Diagnosis not present

## 2017-03-11 DIAGNOSIS — G4730 Sleep apnea, unspecified: Secondary | ICD-10-CM | POA: Diagnosis not present

## 2017-03-11 DIAGNOSIS — G4733 Obstructive sleep apnea (adult) (pediatric): Secondary | ICD-10-CM | POA: Diagnosis not present

## 2017-04-08 DIAGNOSIS — G609 Hereditary and idiopathic neuropathy, unspecified: Secondary | ICD-10-CM | POA: Diagnosis not present

## 2017-04-08 DIAGNOSIS — E1140 Type 2 diabetes mellitus with diabetic neuropathy, unspecified: Secondary | ICD-10-CM | POA: Diagnosis not present

## 2017-04-08 DIAGNOSIS — G589 Mononeuropathy, unspecified: Secondary | ICD-10-CM | POA: Diagnosis not present

## 2017-04-08 DIAGNOSIS — G4730 Sleep apnea, unspecified: Secondary | ICD-10-CM | POA: Diagnosis not present

## 2017-04-08 DIAGNOSIS — M2550 Pain in unspecified joint: Secondary | ICD-10-CM | POA: Diagnosis not present

## 2017-04-08 DIAGNOSIS — G4733 Obstructive sleep apnea (adult) (pediatric): Secondary | ICD-10-CM | POA: Diagnosis not present

## 2017-04-11 DIAGNOSIS — E039 Hypothyroidism, unspecified: Secondary | ICD-10-CM | POA: Diagnosis not present

## 2017-04-11 DIAGNOSIS — I4891 Unspecified atrial fibrillation: Secondary | ICD-10-CM | POA: Diagnosis not present

## 2017-04-11 DIAGNOSIS — N400 Enlarged prostate without lower urinary tract symptoms: Secondary | ICD-10-CM | POA: Diagnosis not present

## 2017-04-16 DIAGNOSIS — D649 Anemia, unspecified: Secondary | ICD-10-CM | POA: Diagnosis not present

## 2017-04-16 DIAGNOSIS — E785 Hyperlipidemia, unspecified: Secondary | ICD-10-CM | POA: Diagnosis not present

## 2017-04-16 DIAGNOSIS — Z5181 Encounter for therapeutic drug level monitoring: Secondary | ICD-10-CM | POA: Diagnosis not present

## 2017-04-16 DIAGNOSIS — E039 Hypothyroidism, unspecified: Secondary | ICD-10-CM | POA: Diagnosis not present

## 2017-04-17 DIAGNOSIS — R946 Abnormal results of thyroid function studies: Secondary | ICD-10-CM | POA: Diagnosis not present

## 2017-04-17 DIAGNOSIS — E039 Hypothyroidism, unspecified: Secondary | ICD-10-CM | POA: Diagnosis not present

## 2017-04-17 DIAGNOSIS — G4733 Obstructive sleep apnea (adult) (pediatric): Secondary | ICD-10-CM | POA: Diagnosis not present

## 2017-04-17 DIAGNOSIS — F331 Major depressive disorder, recurrent, moderate: Secondary | ICD-10-CM | POA: Diagnosis not present

## 2017-05-01 DIAGNOSIS — E669 Obesity, unspecified: Secondary | ICD-10-CM | POA: Diagnosis not present

## 2017-05-01 DIAGNOSIS — E1140 Type 2 diabetes mellitus with diabetic neuropathy, unspecified: Secondary | ICD-10-CM | POA: Diagnosis not present

## 2017-05-01 DIAGNOSIS — F339 Major depressive disorder, recurrent, unspecified: Secondary | ICD-10-CM | POA: Diagnosis not present

## 2017-05-01 DIAGNOSIS — I10 Essential (primary) hypertension: Secondary | ICD-10-CM | POA: Diagnosis not present

## 2017-05-07 DIAGNOSIS — I4891 Unspecified atrial fibrillation: Secondary | ICD-10-CM | POA: Diagnosis not present

## 2017-05-08 DIAGNOSIS — R070 Pain in throat: Secondary | ICD-10-CM | POA: Diagnosis not present

## 2017-05-08 DIAGNOSIS — R05 Cough: Secondary | ICD-10-CM | POA: Diagnosis not present

## 2017-05-08 DIAGNOSIS — G8929 Other chronic pain: Secondary | ICD-10-CM | POA: Diagnosis not present

## 2017-05-08 DIAGNOSIS — F419 Anxiety disorder, unspecified: Secondary | ICD-10-CM | POA: Diagnosis not present

## 2017-05-08 DIAGNOSIS — J309 Allergic rhinitis, unspecified: Secondary | ICD-10-CM | POA: Diagnosis not present

## 2017-05-09 DIAGNOSIS — E1140 Type 2 diabetes mellitus with diabetic neuropathy, unspecified: Secondary | ICD-10-CM | POA: Diagnosis not present

## 2017-05-09 DIAGNOSIS — G589 Mononeuropathy, unspecified: Secondary | ICD-10-CM | POA: Diagnosis not present

## 2017-05-09 DIAGNOSIS — M2550 Pain in unspecified joint: Secondary | ICD-10-CM | POA: Diagnosis not present

## 2017-05-09 DIAGNOSIS — G609 Hereditary and idiopathic neuropathy, unspecified: Secondary | ICD-10-CM | POA: Diagnosis not present

## 2017-05-09 DIAGNOSIS — G4733 Obstructive sleep apnea (adult) (pediatric): Secondary | ICD-10-CM | POA: Diagnosis not present

## 2017-05-09 DIAGNOSIS — G4730 Sleep apnea, unspecified: Secondary | ICD-10-CM | POA: Diagnosis not present

## 2017-05-09 DIAGNOSIS — Z5181 Encounter for therapeutic drug level monitoring: Secondary | ICD-10-CM | POA: Diagnosis not present

## 2017-05-10 ENCOUNTER — Ambulatory Visit (INDEPENDENT_AMBULATORY_CARE_PROVIDER_SITE_OTHER): Payer: Medicare HMO | Admitting: Pharmacist

## 2017-05-10 DIAGNOSIS — Z7901 Long term (current) use of anticoagulants: Secondary | ICD-10-CM

## 2017-05-10 DIAGNOSIS — I4891 Unspecified atrial fibrillation: Secondary | ICD-10-CM

## 2017-05-14 DIAGNOSIS — R0981 Nasal congestion: Secondary | ICD-10-CM | POA: Diagnosis not present

## 2017-05-14 DIAGNOSIS — M25511 Pain in right shoulder: Secondary | ICD-10-CM | POA: Diagnosis not present

## 2017-05-14 DIAGNOSIS — R682 Dry mouth, unspecified: Secondary | ICD-10-CM | POA: Diagnosis not present

## 2017-05-14 DIAGNOSIS — I10 Essential (primary) hypertension: Secondary | ICD-10-CM | POA: Diagnosis not present

## 2017-05-14 DIAGNOSIS — M6281 Muscle weakness (generalized): Secondary | ICD-10-CM | POA: Diagnosis not present

## 2017-05-15 DIAGNOSIS — M25562 Pain in left knee: Secondary | ICD-10-CM | POA: Diagnosis not present

## 2017-05-15 DIAGNOSIS — R5381 Other malaise: Secondary | ICD-10-CM | POA: Diagnosis not present

## 2017-05-15 DIAGNOSIS — R262 Difficulty in walking, not elsewhere classified: Secondary | ICD-10-CM | POA: Diagnosis not present

## 2017-05-15 DIAGNOSIS — M25511 Pain in right shoulder: Secondary | ICD-10-CM | POA: Diagnosis not present

## 2017-05-17 DIAGNOSIS — R5381 Other malaise: Secondary | ICD-10-CM | POA: Diagnosis not present

## 2017-05-17 DIAGNOSIS — R262 Difficulty in walking, not elsewhere classified: Secondary | ICD-10-CM | POA: Diagnosis not present

## 2017-05-17 DIAGNOSIS — M25511 Pain in right shoulder: Secondary | ICD-10-CM | POA: Diagnosis not present

## 2017-05-17 DIAGNOSIS — M25562 Pain in left knee: Secondary | ICD-10-CM | POA: Diagnosis not present

## 2017-05-21 DIAGNOSIS — G4733 Obstructive sleep apnea (adult) (pediatric): Secondary | ICD-10-CM | POA: Diagnosis not present

## 2017-05-21 DIAGNOSIS — F331 Major depressive disorder, recurrent, moderate: Secondary | ICD-10-CM | POA: Diagnosis not present

## 2017-05-22 DIAGNOSIS — R262 Difficulty in walking, not elsewhere classified: Secondary | ICD-10-CM | POA: Diagnosis not present

## 2017-05-22 DIAGNOSIS — M25511 Pain in right shoulder: Secondary | ICD-10-CM | POA: Diagnosis not present

## 2017-05-22 DIAGNOSIS — M25562 Pain in left knee: Secondary | ICD-10-CM | POA: Diagnosis not present

## 2017-05-22 DIAGNOSIS — R5381 Other malaise: Secondary | ICD-10-CM | POA: Diagnosis not present

## 2017-05-28 DIAGNOSIS — D649 Anemia, unspecified: Secondary | ICD-10-CM | POA: Diagnosis not present

## 2017-05-28 DIAGNOSIS — E559 Vitamin D deficiency, unspecified: Secondary | ICD-10-CM | POA: Diagnosis not present

## 2017-05-28 DIAGNOSIS — E039 Hypothyroidism, unspecified: Secondary | ICD-10-CM | POA: Diagnosis not present

## 2017-05-28 DIAGNOSIS — Z79899 Other long term (current) drug therapy: Secondary | ICD-10-CM | POA: Diagnosis not present

## 2017-05-28 DIAGNOSIS — Z5181 Encounter for therapeutic drug level monitoring: Secondary | ICD-10-CM | POA: Diagnosis not present

## 2017-05-28 DIAGNOSIS — E119 Type 2 diabetes mellitus without complications: Secondary | ICD-10-CM | POA: Diagnosis not present

## 2017-05-29 DIAGNOSIS — E559 Vitamin D deficiency, unspecified: Secondary | ICD-10-CM | POA: Diagnosis not present

## 2017-05-29 DIAGNOSIS — E1140 Type 2 diabetes mellitus with diabetic neuropathy, unspecified: Secondary | ICD-10-CM | POA: Diagnosis not present

## 2017-05-29 DIAGNOSIS — E039 Hypothyroidism, unspecified: Secondary | ICD-10-CM | POA: Diagnosis not present

## 2017-05-30 DIAGNOSIS — E039 Hypothyroidism, unspecified: Secondary | ICD-10-CM | POA: Diagnosis not present

## 2017-06-07 DIAGNOSIS — H9209 Otalgia, unspecified ear: Secondary | ICD-10-CM | POA: Diagnosis not present

## 2017-06-07 DIAGNOSIS — J309 Allergic rhinitis, unspecified: Secondary | ICD-10-CM | POA: Diagnosis not present

## 2017-06-07 DIAGNOSIS — R0989 Other specified symptoms and signs involving the circulatory and respiratory systems: Secondary | ICD-10-CM | POA: Diagnosis not present

## 2017-06-07 DIAGNOSIS — G8929 Other chronic pain: Secondary | ICD-10-CM | POA: Diagnosis not present

## 2017-06-07 DIAGNOSIS — M25519 Pain in unspecified shoulder: Secondary | ICD-10-CM | POA: Diagnosis not present

## 2017-06-12 DIAGNOSIS — R5381 Other malaise: Secondary | ICD-10-CM | POA: Diagnosis not present

## 2017-06-12 DIAGNOSIS — M25562 Pain in left knee: Secondary | ICD-10-CM | POA: Diagnosis not present

## 2017-06-12 DIAGNOSIS — R262 Difficulty in walking, not elsewhere classified: Secondary | ICD-10-CM | POA: Diagnosis not present

## 2017-06-12 DIAGNOSIS — M25511 Pain in right shoulder: Secondary | ICD-10-CM | POA: Diagnosis not present

## 2017-06-15 DIAGNOSIS — E669 Obesity, unspecified: Secondary | ICD-10-CM | POA: Diagnosis not present

## 2017-06-15 DIAGNOSIS — Z6841 Body Mass Index (BMI) 40.0 and over, adult: Secondary | ICD-10-CM | POA: Diagnosis not present

## 2017-06-15 DIAGNOSIS — I4891 Unspecified atrial fibrillation: Secondary | ICD-10-CM | POA: Diagnosis not present

## 2017-06-15 DIAGNOSIS — F419 Anxiety disorder, unspecified: Secondary | ICD-10-CM | POA: Diagnosis not present

## 2017-06-15 DIAGNOSIS — E1140 Type 2 diabetes mellitus with diabetic neuropathy, unspecified: Secondary | ICD-10-CM | POA: Diagnosis not present

## 2017-06-25 DIAGNOSIS — G4733 Obstructive sleep apnea (adult) (pediatric): Secondary | ICD-10-CM | POA: Diagnosis not present

## 2017-06-25 DIAGNOSIS — F331 Major depressive disorder, recurrent, moderate: Secondary | ICD-10-CM | POA: Diagnosis not present

## 2017-07-02 DIAGNOSIS — E1151 Type 2 diabetes mellitus with diabetic peripheral angiopathy without gangrene: Secondary | ICD-10-CM | POA: Diagnosis not present

## 2017-07-02 DIAGNOSIS — R0989 Other specified symptoms and signs involving the circulatory and respiratory systems: Secondary | ICD-10-CM | POA: Diagnosis not present

## 2017-07-02 DIAGNOSIS — J309 Allergic rhinitis, unspecified: Secondary | ICD-10-CM | POA: Diagnosis not present

## 2017-07-02 DIAGNOSIS — F419 Anxiety disorder, unspecified: Secondary | ICD-10-CM | POA: Diagnosis not present

## 2017-07-02 DIAGNOSIS — B351 Tinea unguium: Secondary | ICD-10-CM | POA: Diagnosis not present

## 2017-07-02 DIAGNOSIS — R262 Difficulty in walking, not elsewhere classified: Secondary | ICD-10-CM | POA: Diagnosis not present

## 2017-07-05 DIAGNOSIS — F331 Major depressive disorder, recurrent, moderate: Secondary | ICD-10-CM | POA: Diagnosis not present

## 2017-07-11 DIAGNOSIS — R443 Hallucinations, unspecified: Secondary | ICD-10-CM | POA: Diagnosis not present

## 2017-07-11 DIAGNOSIS — F05 Delirium due to known physiological condition: Secondary | ICD-10-CM | POA: Diagnosis not present

## 2017-07-11 DIAGNOSIS — E1140 Type 2 diabetes mellitus with diabetic neuropathy, unspecified: Secondary | ICD-10-CM | POA: Diagnosis not present

## 2017-07-11 DIAGNOSIS — G2581 Restless legs syndrome: Secondary | ICD-10-CM | POA: Diagnosis not present

## 2017-07-11 DIAGNOSIS — G8929 Other chronic pain: Secondary | ICD-10-CM | POA: Diagnosis not present

## 2017-07-12 DIAGNOSIS — B372 Candidiasis of skin and nail: Secondary | ICD-10-CM | POA: Diagnosis not present

## 2017-07-12 DIAGNOSIS — L304 Erythema intertrigo: Secondary | ICD-10-CM | POA: Diagnosis not present

## 2017-07-12 DIAGNOSIS — N390 Urinary tract infection, site not specified: Secondary | ICD-10-CM | POA: Diagnosis not present

## 2017-07-12 DIAGNOSIS — M79669 Pain in unspecified lower leg: Secondary | ICD-10-CM | POA: Diagnosis not present

## 2017-07-12 DIAGNOSIS — G8929 Other chronic pain: Secondary | ICD-10-CM | POA: Diagnosis not present

## 2017-07-13 DIAGNOSIS — E119 Type 2 diabetes mellitus without complications: Secondary | ICD-10-CM | POA: Diagnosis not present

## 2017-07-13 DIAGNOSIS — D649 Anemia, unspecified: Secondary | ICD-10-CM | POA: Diagnosis not present

## 2017-07-18 DIAGNOSIS — B372 Candidiasis of skin and nail: Secondary | ICD-10-CM | POA: Diagnosis not present

## 2017-07-18 DIAGNOSIS — G8929 Other chronic pain: Secondary | ICD-10-CM | POA: Diagnosis not present

## 2017-07-18 DIAGNOSIS — E1140 Type 2 diabetes mellitus with diabetic neuropathy, unspecified: Secondary | ICD-10-CM | POA: Diagnosis not present

## 2017-07-18 DIAGNOSIS — L309 Dermatitis, unspecified: Secondary | ICD-10-CM | POA: Diagnosis not present

## 2017-07-23 DIAGNOSIS — G4733 Obstructive sleep apnea (adult) (pediatric): Secondary | ICD-10-CM | POA: Diagnosis not present

## 2017-07-23 DIAGNOSIS — F331 Major depressive disorder, recurrent, moderate: Secondary | ICD-10-CM | POA: Diagnosis not present

## 2017-07-24 DIAGNOSIS — I4891 Unspecified atrial fibrillation: Secondary | ICD-10-CM | POA: Diagnosis not present

## 2017-07-24 DIAGNOSIS — E0840 Diabetes mellitus due to underlying condition with diabetic neuropathy, unspecified: Secondary | ICD-10-CM | POA: Diagnosis not present

## 2017-07-24 DIAGNOSIS — M6281 Muscle weakness (generalized): Secondary | ICD-10-CM | POA: Diagnosis not present

## 2017-07-25 DIAGNOSIS — F064 Anxiety disorder due to known physiological condition: Secondary | ICD-10-CM | POA: Diagnosis not present

## 2017-07-25 DIAGNOSIS — L304 Erythema intertrigo: Secondary | ICD-10-CM | POA: Diagnosis not present

## 2017-07-25 DIAGNOSIS — M6281 Muscle weakness (generalized): Secondary | ICD-10-CM | POA: Diagnosis not present

## 2017-07-25 DIAGNOSIS — E291 Testicular hypofunction: Secondary | ICD-10-CM | POA: Diagnosis not present

## 2017-07-25 DIAGNOSIS — R5381 Other malaise: Secondary | ICD-10-CM | POA: Diagnosis not present

## 2017-07-25 DIAGNOSIS — E0840 Diabetes mellitus due to underlying condition with diabetic neuropathy, unspecified: Secondary | ICD-10-CM | POA: Diagnosis not present

## 2017-07-25 DIAGNOSIS — I4891 Unspecified atrial fibrillation: Secondary | ICD-10-CM | POA: Diagnosis not present

## 2017-07-25 DIAGNOSIS — B372 Candidiasis of skin and nail: Secondary | ICD-10-CM | POA: Diagnosis not present

## 2017-07-25 DIAGNOSIS — F331 Major depressive disorder, recurrent, moderate: Secondary | ICD-10-CM | POA: Diagnosis not present

## 2017-07-26 DIAGNOSIS — E0840 Diabetes mellitus due to underlying condition with diabetic neuropathy, unspecified: Secondary | ICD-10-CM | POA: Diagnosis not present

## 2017-07-26 DIAGNOSIS — M6281 Muscle weakness (generalized): Secondary | ICD-10-CM | POA: Diagnosis not present

## 2017-07-26 DIAGNOSIS — I4891 Unspecified atrial fibrillation: Secondary | ICD-10-CM | POA: Diagnosis not present

## 2017-07-27 DIAGNOSIS — I4891 Unspecified atrial fibrillation: Secondary | ICD-10-CM | POA: Diagnosis not present

## 2017-07-27 DIAGNOSIS — M6281 Muscle weakness (generalized): Secondary | ICD-10-CM | POA: Diagnosis not present

## 2017-07-27 DIAGNOSIS — E0840 Diabetes mellitus due to underlying condition with diabetic neuropathy, unspecified: Secondary | ICD-10-CM | POA: Diagnosis not present

## 2017-07-27 DIAGNOSIS — Z139 Encounter for screening, unspecified: Secondary | ICD-10-CM | POA: Diagnosis not present

## 2017-07-30 DIAGNOSIS — M6281 Muscle weakness (generalized): Secondary | ICD-10-CM | POA: Diagnosis not present

## 2017-07-30 DIAGNOSIS — E0840 Diabetes mellitus due to underlying condition with diabetic neuropathy, unspecified: Secondary | ICD-10-CM | POA: Diagnosis not present

## 2017-07-30 DIAGNOSIS — I4891 Unspecified atrial fibrillation: Secondary | ICD-10-CM | POA: Diagnosis not present

## 2017-07-31 DIAGNOSIS — E0840 Diabetes mellitus due to underlying condition with diabetic neuropathy, unspecified: Secondary | ICD-10-CM | POA: Diagnosis not present

## 2017-07-31 DIAGNOSIS — M6281 Muscle weakness (generalized): Secondary | ICD-10-CM | POA: Diagnosis not present

## 2017-07-31 DIAGNOSIS — I4891 Unspecified atrial fibrillation: Secondary | ICD-10-CM | POA: Diagnosis not present

## 2017-08-01 DIAGNOSIS — E0840 Diabetes mellitus due to underlying condition with diabetic neuropathy, unspecified: Secondary | ICD-10-CM | POA: Diagnosis not present

## 2017-08-01 DIAGNOSIS — F331 Major depressive disorder, recurrent, moderate: Secondary | ICD-10-CM | POA: Diagnosis not present

## 2017-08-01 DIAGNOSIS — M6281 Muscle weakness (generalized): Secondary | ICD-10-CM | POA: Diagnosis not present

## 2017-08-01 DIAGNOSIS — F064 Anxiety disorder due to known physiological condition: Secondary | ICD-10-CM | POA: Diagnosis not present

## 2017-08-01 DIAGNOSIS — I4891 Unspecified atrial fibrillation: Secondary | ICD-10-CM | POA: Diagnosis not present

## 2017-08-02 DIAGNOSIS — E0840 Diabetes mellitus due to underlying condition with diabetic neuropathy, unspecified: Secondary | ICD-10-CM | POA: Diagnosis not present

## 2017-08-02 DIAGNOSIS — I4891 Unspecified atrial fibrillation: Secondary | ICD-10-CM | POA: Diagnosis not present

## 2017-08-02 DIAGNOSIS — M6281 Muscle weakness (generalized): Secondary | ICD-10-CM | POA: Diagnosis not present

## 2017-08-03 DIAGNOSIS — E0840 Diabetes mellitus due to underlying condition with diabetic neuropathy, unspecified: Secondary | ICD-10-CM | POA: Diagnosis not present

## 2017-08-03 DIAGNOSIS — M6281 Muscle weakness (generalized): Secondary | ICD-10-CM | POA: Diagnosis not present

## 2017-08-03 DIAGNOSIS — I4891 Unspecified atrial fibrillation: Secondary | ICD-10-CM | POA: Diagnosis not present

## 2017-08-06 DIAGNOSIS — R443 Hallucinations, unspecified: Secondary | ICD-10-CM | POA: Diagnosis not present

## 2017-08-06 DIAGNOSIS — R5383 Other fatigue: Secondary | ICD-10-CM | POA: Diagnosis not present

## 2017-08-06 DIAGNOSIS — E291 Testicular hypofunction: Secondary | ICD-10-CM | POA: Diagnosis not present

## 2017-08-06 DIAGNOSIS — M6281 Muscle weakness (generalized): Secondary | ICD-10-CM | POA: Diagnosis not present

## 2017-08-06 DIAGNOSIS — E0840 Diabetes mellitus due to underlying condition with diabetic neuropathy, unspecified: Secondary | ICD-10-CM | POA: Diagnosis not present

## 2017-08-06 DIAGNOSIS — I4891 Unspecified atrial fibrillation: Secondary | ICD-10-CM | POA: Diagnosis not present

## 2017-08-08 DIAGNOSIS — F331 Major depressive disorder, recurrent, moderate: Secondary | ICD-10-CM | POA: Diagnosis not present

## 2017-08-08 DIAGNOSIS — F064 Anxiety disorder due to known physiological condition: Secondary | ICD-10-CM | POA: Diagnosis not present

## 2017-08-10 DIAGNOSIS — E039 Hypothyroidism, unspecified: Secondary | ICD-10-CM | POA: Diagnosis not present

## 2017-08-10 DIAGNOSIS — E559 Vitamin D deficiency, unspecified: Secondary | ICD-10-CM | POA: Diagnosis not present

## 2017-08-10 DIAGNOSIS — Z5181 Encounter for therapeutic drug level monitoring: Secondary | ICD-10-CM | POA: Diagnosis not present

## 2017-08-15 DIAGNOSIS — F331 Major depressive disorder, recurrent, moderate: Secondary | ICD-10-CM | POA: Diagnosis not present

## 2017-08-15 DIAGNOSIS — F064 Anxiety disorder due to known physiological condition: Secondary | ICD-10-CM | POA: Diagnosis not present

## 2017-08-17 DIAGNOSIS — E1151 Type 2 diabetes mellitus with diabetic peripheral angiopathy without gangrene: Secondary | ICD-10-CM | POA: Diagnosis not present

## 2017-08-17 DIAGNOSIS — B351 Tinea unguium: Secondary | ICD-10-CM | POA: Diagnosis not present

## 2017-08-20 DIAGNOSIS — F331 Major depressive disorder, recurrent, moderate: Secondary | ICD-10-CM | POA: Diagnosis not present

## 2017-08-20 DIAGNOSIS — G4733 Obstructive sleep apnea (adult) (pediatric): Secondary | ICD-10-CM | POA: Diagnosis not present

## 2017-08-21 DIAGNOSIS — F331 Major depressive disorder, recurrent, moderate: Secondary | ICD-10-CM | POA: Diagnosis not present

## 2017-08-21 DIAGNOSIS — F419 Anxiety disorder, unspecified: Secondary | ICD-10-CM | POA: Diagnosis not present

## 2017-08-21 DIAGNOSIS — G8929 Other chronic pain: Secondary | ICD-10-CM | POA: Diagnosis not present

## 2017-08-21 DIAGNOSIS — R443 Hallucinations, unspecified: Secondary | ICD-10-CM | POA: Diagnosis not present

## 2017-08-22 DIAGNOSIS — F331 Major depressive disorder, recurrent, moderate: Secondary | ICD-10-CM | POA: Diagnosis not present

## 2017-08-22 DIAGNOSIS — F064 Anxiety disorder due to known physiological condition: Secondary | ICD-10-CM | POA: Diagnosis not present

## 2017-08-24 DIAGNOSIS — F331 Major depressive disorder, recurrent, moderate: Secondary | ICD-10-CM | POA: Diagnosis not present

## 2017-08-24 DIAGNOSIS — G8929 Other chronic pain: Secondary | ICD-10-CM | POA: Diagnosis not present

## 2017-08-24 DIAGNOSIS — F419 Anxiety disorder, unspecified: Secondary | ICD-10-CM | POA: Diagnosis not present

## 2017-08-24 DIAGNOSIS — R443 Hallucinations, unspecified: Secondary | ICD-10-CM | POA: Diagnosis not present

## 2017-08-29 DIAGNOSIS — F331 Major depressive disorder, recurrent, moderate: Secondary | ICD-10-CM | POA: Diagnosis not present

## 2017-08-29 DIAGNOSIS — F064 Anxiety disorder due to known physiological condition: Secondary | ICD-10-CM | POA: Diagnosis not present

## 2017-08-31 DIAGNOSIS — G8929 Other chronic pain: Secondary | ICD-10-CM | POA: Diagnosis not present

## 2017-08-31 DIAGNOSIS — E1140 Type 2 diabetes mellitus with diabetic neuropathy, unspecified: Secondary | ICD-10-CM | POA: Diagnosis not present

## 2017-08-31 DIAGNOSIS — F419 Anxiety disorder, unspecified: Secondary | ICD-10-CM | POA: Diagnosis not present

## 2017-08-31 DIAGNOSIS — Z6841 Body Mass Index (BMI) 40.0 and over, adult: Secondary | ICD-10-CM | POA: Diagnosis not present

## 2017-08-31 DIAGNOSIS — E039 Hypothyroidism, unspecified: Secondary | ICD-10-CM | POA: Diagnosis not present

## 2017-08-31 DIAGNOSIS — N400 Enlarged prostate without lower urinary tract symptoms: Secondary | ICD-10-CM | POA: Diagnosis not present

## 2017-08-31 DIAGNOSIS — E291 Testicular hypofunction: Secondary | ICD-10-CM | POA: Diagnosis not present

## 2017-08-31 DIAGNOSIS — I4891 Unspecified atrial fibrillation: Secondary | ICD-10-CM | POA: Diagnosis not present

## 2017-09-03 DIAGNOSIS — R05 Cough: Secondary | ICD-10-CM | POA: Diagnosis not present

## 2017-09-03 DIAGNOSIS — R5381 Other malaise: Secondary | ICD-10-CM | POA: Diagnosis not present

## 2017-09-03 DIAGNOSIS — R0989 Other specified symptoms and signs involving the circulatory and respiratory systems: Secondary | ICD-10-CM | POA: Diagnosis not present

## 2017-09-03 DIAGNOSIS — J069 Acute upper respiratory infection, unspecified: Secondary | ICD-10-CM | POA: Diagnosis not present

## 2017-09-04 DIAGNOSIS — F331 Major depressive disorder, recurrent, moderate: Secondary | ICD-10-CM | POA: Diagnosis not present

## 2017-09-04 DIAGNOSIS — F064 Anxiety disorder due to known physiological condition: Secondary | ICD-10-CM | POA: Diagnosis not present

## 2017-09-05 DIAGNOSIS — D649 Anemia, unspecified: Secondary | ICD-10-CM | POA: Diagnosis not present

## 2017-09-05 DIAGNOSIS — Z79899 Other long term (current) drug therapy: Secondary | ICD-10-CM | POA: Diagnosis not present

## 2017-09-05 DIAGNOSIS — E119 Type 2 diabetes mellitus without complications: Secondary | ICD-10-CM | POA: Diagnosis not present

## 2017-09-10 DIAGNOSIS — M6281 Muscle weakness (generalized): Secondary | ICD-10-CM | POA: Diagnosis not present

## 2017-09-10 DIAGNOSIS — R443 Hallucinations, unspecified: Secondary | ICD-10-CM | POA: Diagnosis not present

## 2017-09-10 DIAGNOSIS — G8929 Other chronic pain: Secondary | ICD-10-CM | POA: Diagnosis not present

## 2017-09-10 DIAGNOSIS — E291 Testicular hypofunction: Secondary | ICD-10-CM | POA: Diagnosis not present

## 2017-09-12 DIAGNOSIS — D649 Anemia, unspecified: Secondary | ICD-10-CM | POA: Diagnosis not present

## 2017-09-12 DIAGNOSIS — R531 Weakness: Secondary | ICD-10-CM | POA: Diagnosis not present

## 2017-09-12 DIAGNOSIS — M2550 Pain in unspecified joint: Secondary | ICD-10-CM | POA: Diagnosis not present

## 2017-09-12 DIAGNOSIS — G589 Mononeuropathy, unspecified: Secondary | ICD-10-CM | POA: Diagnosis not present

## 2017-09-12 DIAGNOSIS — G4733 Obstructive sleep apnea (adult) (pediatric): Secondary | ICD-10-CM | POA: Diagnosis not present

## 2017-09-12 DIAGNOSIS — E1140 Type 2 diabetes mellitus with diabetic neuropathy, unspecified: Secondary | ICD-10-CM | POA: Diagnosis not present

## 2017-09-12 DIAGNOSIS — E291 Testicular hypofunction: Secondary | ICD-10-CM | POA: Diagnosis not present

## 2017-09-12 DIAGNOSIS — G609 Hereditary and idiopathic neuropathy, unspecified: Secondary | ICD-10-CM | POA: Diagnosis not present

## 2017-09-12 DIAGNOSIS — E0840 Diabetes mellitus due to underlying condition with diabetic neuropathy, unspecified: Secondary | ICD-10-CM | POA: Diagnosis not present

## 2017-09-12 DIAGNOSIS — G4730 Sleep apnea, unspecified: Secondary | ICD-10-CM | POA: Diagnosis not present

## 2017-09-12 DIAGNOSIS — Z79899 Other long term (current) drug therapy: Secondary | ICD-10-CM | POA: Diagnosis not present

## 2017-09-12 DIAGNOSIS — E039 Hypothyroidism, unspecified: Secondary | ICD-10-CM | POA: Diagnosis not present

## 2017-09-13 DIAGNOSIS — F331 Major depressive disorder, recurrent, moderate: Secondary | ICD-10-CM | POA: Diagnosis not present

## 2017-09-13 DIAGNOSIS — G4733 Obstructive sleep apnea (adult) (pediatric): Secondary | ICD-10-CM | POA: Diagnosis not present

## 2017-09-17 DIAGNOSIS — G4733 Obstructive sleep apnea (adult) (pediatric): Secondary | ICD-10-CM | POA: Diagnosis not present

## 2017-09-17 DIAGNOSIS — F331 Major depressive disorder, recurrent, moderate: Secondary | ICD-10-CM | POA: Diagnosis not present

## 2017-09-20 DIAGNOSIS — F331 Major depressive disorder, recurrent, moderate: Secondary | ICD-10-CM | POA: Diagnosis not present

## 2017-09-20 DIAGNOSIS — F064 Anxiety disorder due to known physiological condition: Secondary | ICD-10-CM | POA: Diagnosis not present

## 2017-09-21 DIAGNOSIS — E1140 Type 2 diabetes mellitus with diabetic neuropathy, unspecified: Secondary | ICD-10-CM | POA: Diagnosis not present

## 2017-09-21 DIAGNOSIS — B353 Tinea pedis: Secondary | ICD-10-CM | POA: Diagnosis not present

## 2017-09-21 DIAGNOSIS — L853 Xerosis cutis: Secondary | ICD-10-CM | POA: Diagnosis not present

## 2017-09-21 DIAGNOSIS — L299 Pruritus, unspecified: Secondary | ICD-10-CM | POA: Diagnosis not present

## 2017-09-24 DIAGNOSIS — E119 Type 2 diabetes mellitus without complications: Secondary | ICD-10-CM | POA: Diagnosis not present

## 2017-09-24 DIAGNOSIS — D649 Anemia, unspecified: Secondary | ICD-10-CM | POA: Diagnosis not present

## 2017-09-26 DIAGNOSIS — F064 Anxiety disorder due to known physiological condition: Secondary | ICD-10-CM | POA: Diagnosis not present

## 2017-09-26 DIAGNOSIS — F331 Major depressive disorder, recurrent, moderate: Secondary | ICD-10-CM | POA: Diagnosis not present

## 2017-09-27 DIAGNOSIS — R262 Difficulty in walking, not elsewhere classified: Secondary | ICD-10-CM | POA: Diagnosis not present

## 2017-09-27 DIAGNOSIS — H2513 Age-related nuclear cataract, bilateral: Secondary | ICD-10-CM | POA: Diagnosis not present

## 2017-09-27 DIAGNOSIS — M65812 Other synovitis and tenosynovitis, left shoulder: Secondary | ICD-10-CM | POA: Diagnosis not present

## 2017-09-27 DIAGNOSIS — Z7984 Long term (current) use of oral hypoglycemic drugs: Secondary | ICD-10-CM | POA: Diagnosis not present

## 2017-09-27 DIAGNOSIS — E119 Type 2 diabetes mellitus without complications: Secondary | ICD-10-CM | POA: Diagnosis not present

## 2017-09-27 DIAGNOSIS — R5381 Other malaise: Secondary | ICD-10-CM | POA: Diagnosis not present

## 2017-09-27 DIAGNOSIS — Z7951 Long term (current) use of inhaled steroids: Secondary | ICD-10-CM | POA: Diagnosis not present

## 2017-10-03 DIAGNOSIS — F064 Anxiety disorder due to known physiological condition: Secondary | ICD-10-CM | POA: Diagnosis not present

## 2017-10-03 DIAGNOSIS — F349 Persistent mood [affective] disorder, unspecified: Secondary | ICD-10-CM | POA: Diagnosis not present

## 2017-10-10 DIAGNOSIS — F064 Anxiety disorder due to known physiological condition: Secondary | ICD-10-CM | POA: Diagnosis not present

## 2017-10-10 DIAGNOSIS — F349 Persistent mood [affective] disorder, unspecified: Secondary | ICD-10-CM | POA: Diagnosis not present

## 2017-10-15 DIAGNOSIS — G8929 Other chronic pain: Secondary | ICD-10-CM | POA: Diagnosis not present

## 2017-10-15 DIAGNOSIS — E1140 Type 2 diabetes mellitus with diabetic neuropathy, unspecified: Secondary | ICD-10-CM | POA: Diagnosis not present

## 2017-10-15 DIAGNOSIS — R443 Hallucinations, unspecified: Secondary | ICD-10-CM | POA: Diagnosis not present

## 2017-10-15 DIAGNOSIS — G4733 Obstructive sleep apnea (adult) (pediatric): Secondary | ICD-10-CM | POA: Diagnosis not present

## 2017-10-15 DIAGNOSIS — F331 Major depressive disorder, recurrent, moderate: Secondary | ICD-10-CM | POA: Diagnosis not present

## 2017-10-15 DIAGNOSIS — M791 Myalgia, unspecified site: Secondary | ICD-10-CM | POA: Diagnosis not present

## 2018-02-07 DIAGNOSIS — H6123 Impacted cerumen, bilateral: Secondary | ICD-10-CM | POA: Insufficient documentation

## 2018-02-07 DIAGNOSIS — H9011 Conductive hearing loss, unilateral, right ear, with unrestricted hearing on the contralateral side: Secondary | ICD-10-CM | POA: Insufficient documentation

## 2018-03-21 ENCOUNTER — Emergency Department (HOSPITAL_COMMUNITY): Payer: Medicare Other

## 2018-03-21 ENCOUNTER — Other Ambulatory Visit: Payer: Self-pay

## 2018-03-21 ENCOUNTER — Emergency Department (HOSPITAL_COMMUNITY)
Admission: EM | Admit: 2018-03-21 | Discharge: 2018-03-22 | Payer: Medicare Other | Attending: Emergency Medicine | Admitting: Emergency Medicine

## 2018-03-21 DIAGNOSIS — M25562 Pain in left knee: Secondary | ICD-10-CM | POA: Diagnosis not present

## 2018-03-21 DIAGNOSIS — Y929 Unspecified place or not applicable: Secondary | ICD-10-CM | POA: Insufficient documentation

## 2018-03-21 DIAGNOSIS — S0191XA Laceration without foreign body of unspecified part of head, initial encounter: Secondary | ICD-10-CM | POA: Diagnosis not present

## 2018-03-21 DIAGNOSIS — Y999 Unspecified external cause status: Secondary | ICD-10-CM | POA: Diagnosis not present

## 2018-03-21 DIAGNOSIS — W228XXA Striking against or struck by other objects, initial encounter: Secondary | ICD-10-CM | POA: Diagnosis not present

## 2018-03-21 DIAGNOSIS — Z7984 Long term (current) use of oral hypoglycemic drugs: Secondary | ICD-10-CM | POA: Diagnosis not present

## 2018-03-21 DIAGNOSIS — Z79899 Other long term (current) drug therapy: Secondary | ICD-10-CM | POA: Insufficient documentation

## 2018-03-21 DIAGNOSIS — E039 Hypothyroidism, unspecified: Secondary | ICD-10-CM | POA: Insufficient documentation

## 2018-03-21 DIAGNOSIS — E119 Type 2 diabetes mellitus without complications: Secondary | ICD-10-CM | POA: Insufficient documentation

## 2018-03-21 DIAGNOSIS — D010 Carcinoma in situ of colon: Secondary | ICD-10-CM | POA: Insufficient documentation

## 2018-03-21 DIAGNOSIS — Y939 Activity, unspecified: Secondary | ICD-10-CM | POA: Insufficient documentation

## 2018-03-21 DIAGNOSIS — S0101XA Laceration without foreign body of scalp, initial encounter: Secondary | ICD-10-CM

## 2018-03-21 DIAGNOSIS — Z23 Encounter for immunization: Secondary | ICD-10-CM | POA: Insufficient documentation

## 2018-03-21 DIAGNOSIS — I10 Essential (primary) hypertension: Secondary | ICD-10-CM | POA: Insufficient documentation

## 2018-03-21 DIAGNOSIS — S0990XA Unspecified injury of head, initial encounter: Secondary | ICD-10-CM

## 2018-03-21 MED ORDER — LIDOCAINE-EPINEPHRINE (PF) 2 %-1:200000 IJ SOLN
20.0000 mL | Freq: Once | INTRAMUSCULAR | Status: AC
  Administered 2018-03-22: 20 mL via INTRADERMAL
  Filled 2018-03-21: qty 20

## 2018-03-21 MED ORDER — MUPIROCIN CALCIUM 2 % EX CREA
TOPICAL_CREAM | Freq: Once | CUTANEOUS | Status: AC
  Administered 2018-03-22: 1 via TOPICAL
  Filled 2018-03-21: qty 15

## 2018-03-21 MED ORDER — LIDOCAINE-EPINEPHRINE (PF) 2 %-1:200000 IJ SOLN
20.0000 mL | Freq: Once | INTRAMUSCULAR | Status: AC
  Administered 2018-03-21: 20 mL
  Filled 2018-03-21: qty 20

## 2018-03-21 MED ORDER — FENTANYL CITRATE (PF) 100 MCG/2ML IJ SOLN
100.0000 ug | Freq: Once | INTRAMUSCULAR | Status: AC
  Administered 2018-03-21: 50 ug via INTRAVENOUS
  Filled 2018-03-21: qty 2

## 2018-03-21 MED ORDER — TETANUS-DIPHTH-ACELL PERTUSSIS 5-2.5-18.5 LF-MCG/0.5 IM SUSP
0.5000 mL | Freq: Once | INTRAMUSCULAR | Status: AC
  Administered 2018-03-21: 0.5 mL via INTRAMUSCULAR
  Filled 2018-03-21: qty 0.5
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
## 2018-03-21 NOTE — Progress Notes (Signed)
Asked by ED PA to evaluate pt's complex head laceration to see if trauma or trauma ENT should handle On xarelto Family at [facility] Pt in NAD A little pale Complex Rt forehead lac down to skull Active small pulsatile bleed along lateral skin edge Held compression for 3 min without stopping Area prepped with betadine Area infiltrated with 1% xylocaine 1.5 cc Draped, sterile gloves ligated small 0.31m vessel subcu vessel with single 4-0 vicryl  Pt tolerated well No more active bleed Area redressed ED PA to call facial ENT  Leighton Ruff. Redmond Pulling, MD, FACS General, Bariatric, & Minimally Invasive Surgery [facility], [place]
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
## 2018-03-21 NOTE — ED Notes (Signed)
Patient transported to CT and then to X-ray
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
## 2018-03-21 NOTE — Progress Notes (Signed)
Per RN, pt states he cannot lay flat in CT. Pt states he wears 15/10 at home. RT placed on BIPAP, transported to CT without complications.

## 2018-03-22 DIAGNOSIS — S0191XA Laceration without foreign body of unspecified part of head, initial encounter: Secondary | ICD-10-CM | POA: Diagnosis not present

## 2018-03-22 MED ORDER — DOXYCYCLINE HYCLATE 100 MG PO CAPS: 100.0000 mg | ORAL_CAPSULE | Freq: Two times a day (BID) | ORAL | 0 refills | Status: AC

## 2018-03-22 MED ORDER — DOXYCYCLINE HYCLATE 100 MG PO TABS
100.0000 mg | ORAL_TABLET | Freq: Once | ORAL | Status: AC
  Administered 2018-03-22: 100 mg via ORAL
  Filled 2018-03-22: qty 1

## 2018-03-22 NOTE — ED Notes (Signed)
Patient verbalizes understanding of discharge instructions. Opportunity for questioning and answers were provided. Armband removed by staff, pt discharged from ED.  

## 2018-03-22 NOTE — ED Notes (Signed)
Called [facility] for transport home--rn requested a bariatric bed to transport patient--Brian Richmond

## 2018-03-22 NOTE — Discharge Instructions (Signed)
You can remove the dressing in 2 days. You can follow up in Dr. Pollie Friar office for suture removal or he can have these removed at your facility. Please be sure to take the antibiotics for the entire course to prevent infection. Continue your home pain medication as previously prescribed. Return to the ED for signs of infection including redness, pus draining from the area, fever.

## 2018-06-19 IMAGING — CR DG KNEE COMPLETE 4+V*R*
4 series · 4 of 4 positions shown · non-contrast
Comparison: None.

CLINICAL DATA: Status post fall with right knee pain.

EXAM:
RIGHT KNEE - COMPLETE 4+ VIEW

[t knee ap right]
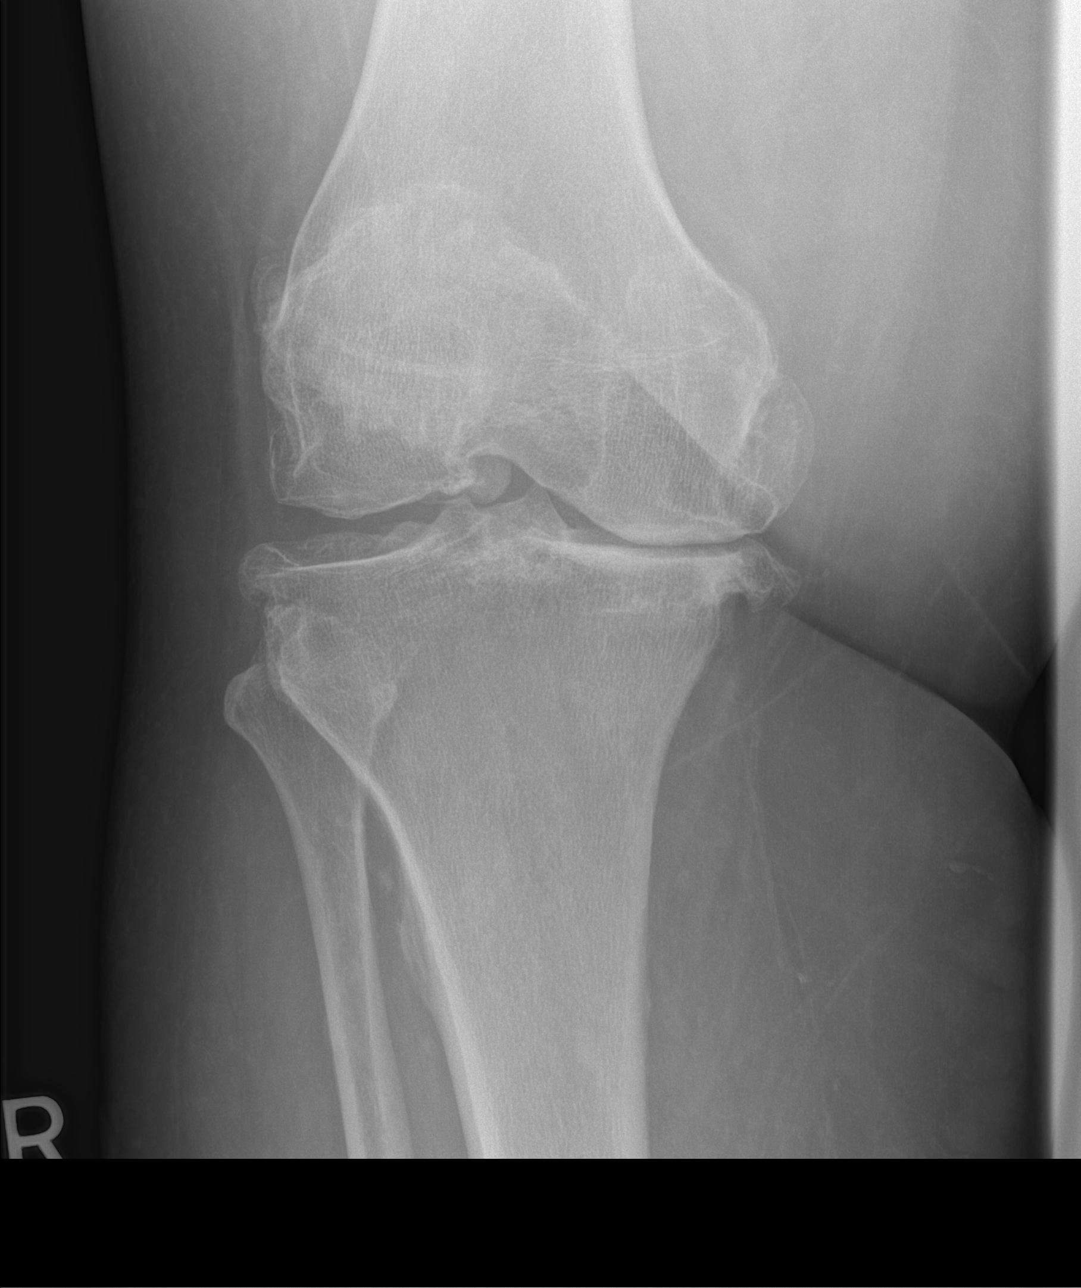

[t knee obl right (1 of 2)]
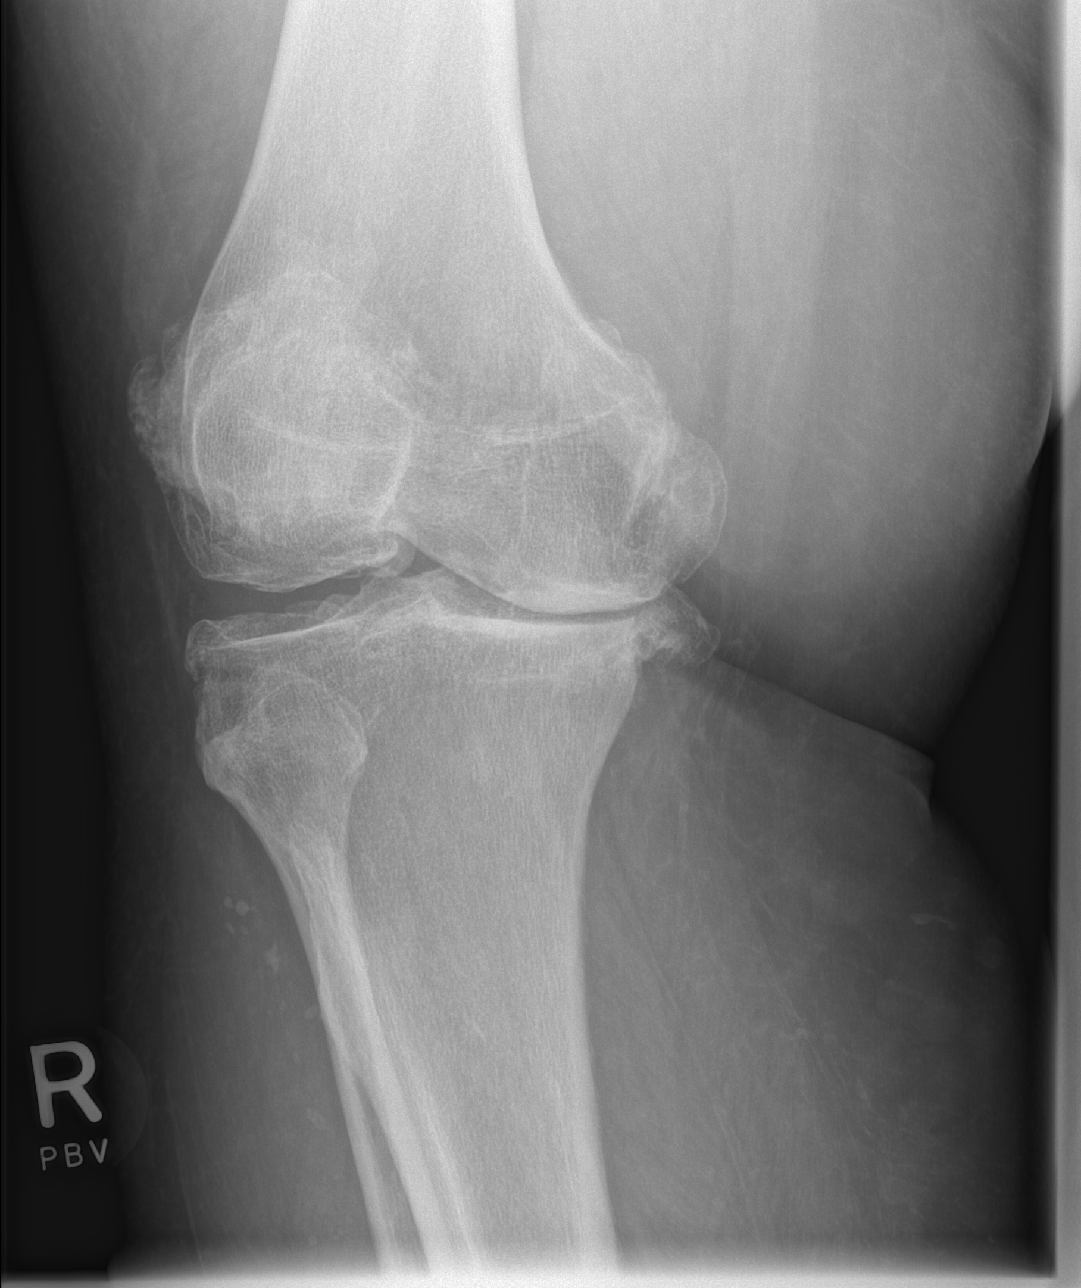

[t knee obl right (2 of 2)]
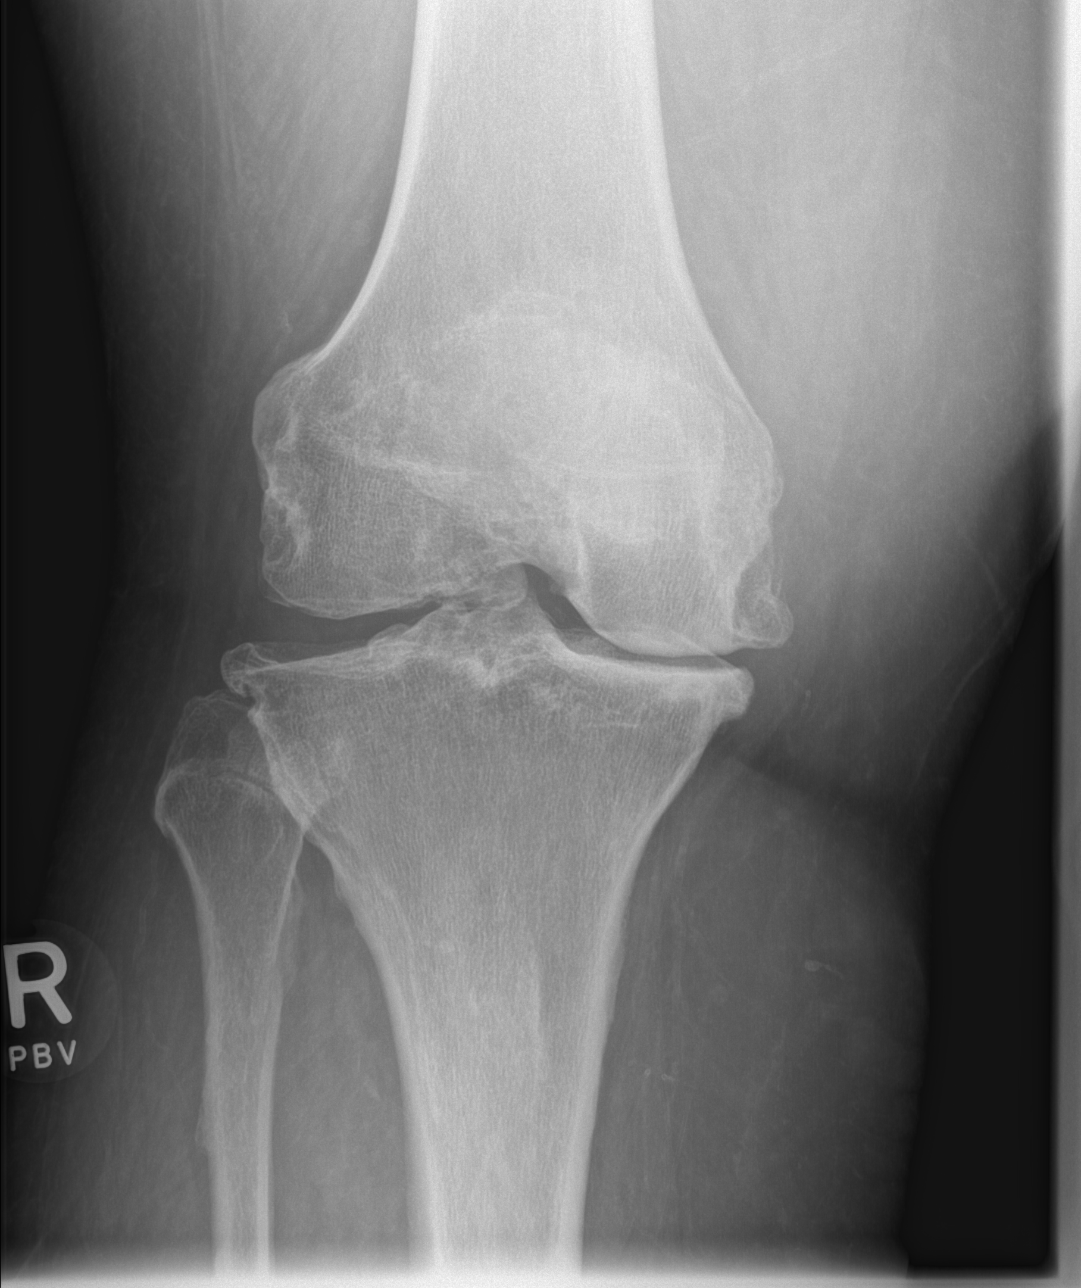

[x knee lat right]
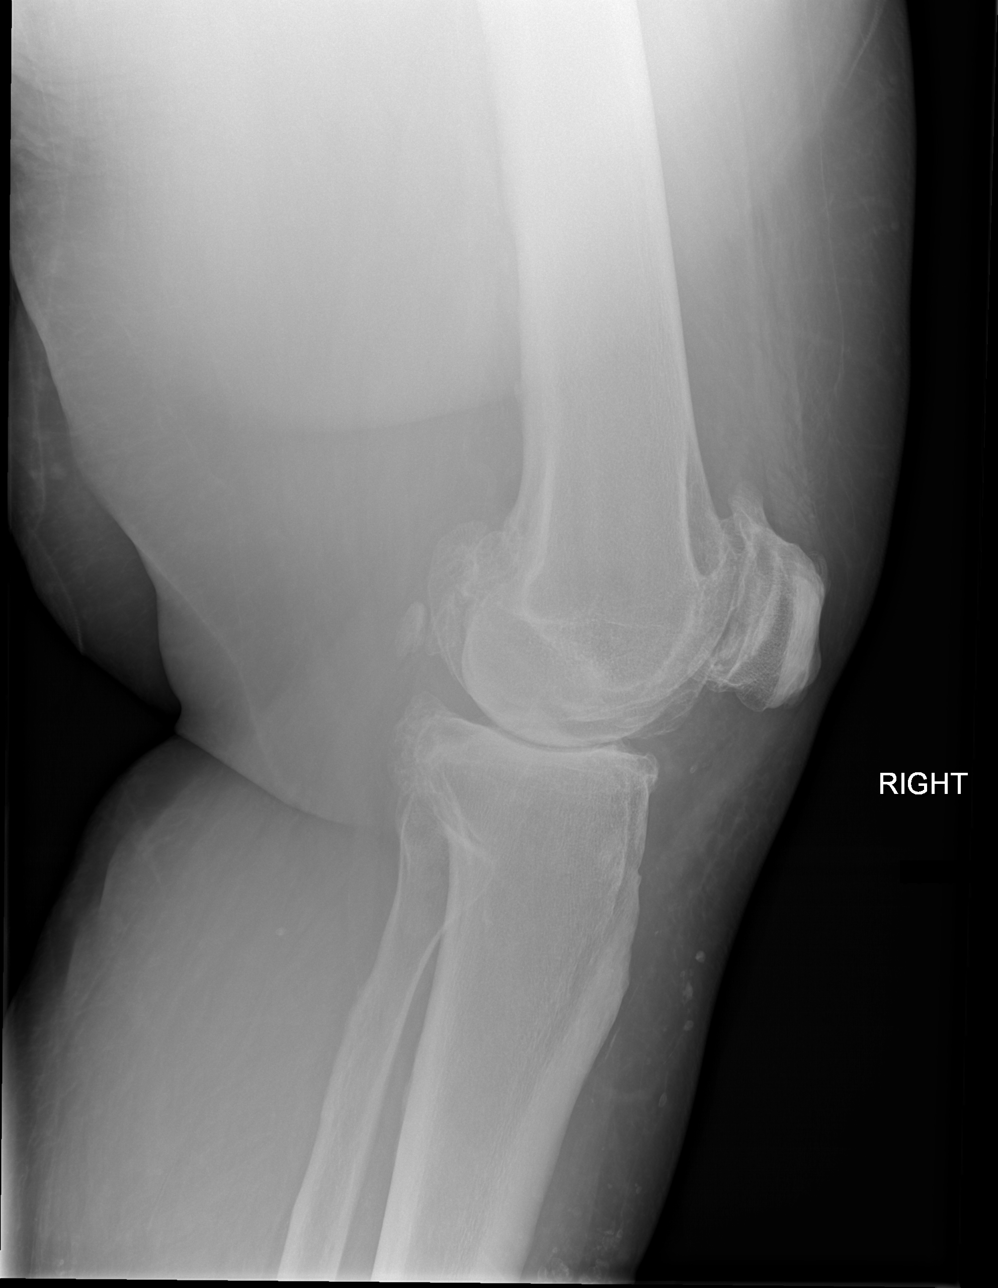

[4 of 4 positions shown; findings below may reference images not displayed]

FINDINGS: No evidence of fracture, dislocation, or joint effusion. Advanced 3
compartment osteoarthritic changes of the right knee with joint
space narrowing, subchondral sclerosis and remodeling and exuberant
osteophytosis.
IMPRESSION: No acute fracture or dislocation identified about the right Knee.

Advanced 3 compartment osteoarthritic changes of the right knee.
Please note that a subtle tibial plateau fracture would be easily
overlooked in the settings of advanced osteoarthritic changes and
bony remodeling.

## 2018-06-19 IMAGING — CR DG ANKLE COMPLETE 3+V*R*
3 series · 3 of 3 positions shown · non-contrast
Comparison: None.

CLINICAL DATA: Pain following fall

EXAM:
RIGHT ANKLE - COMPLETE 3+ VIEW

[x ankle ap right]
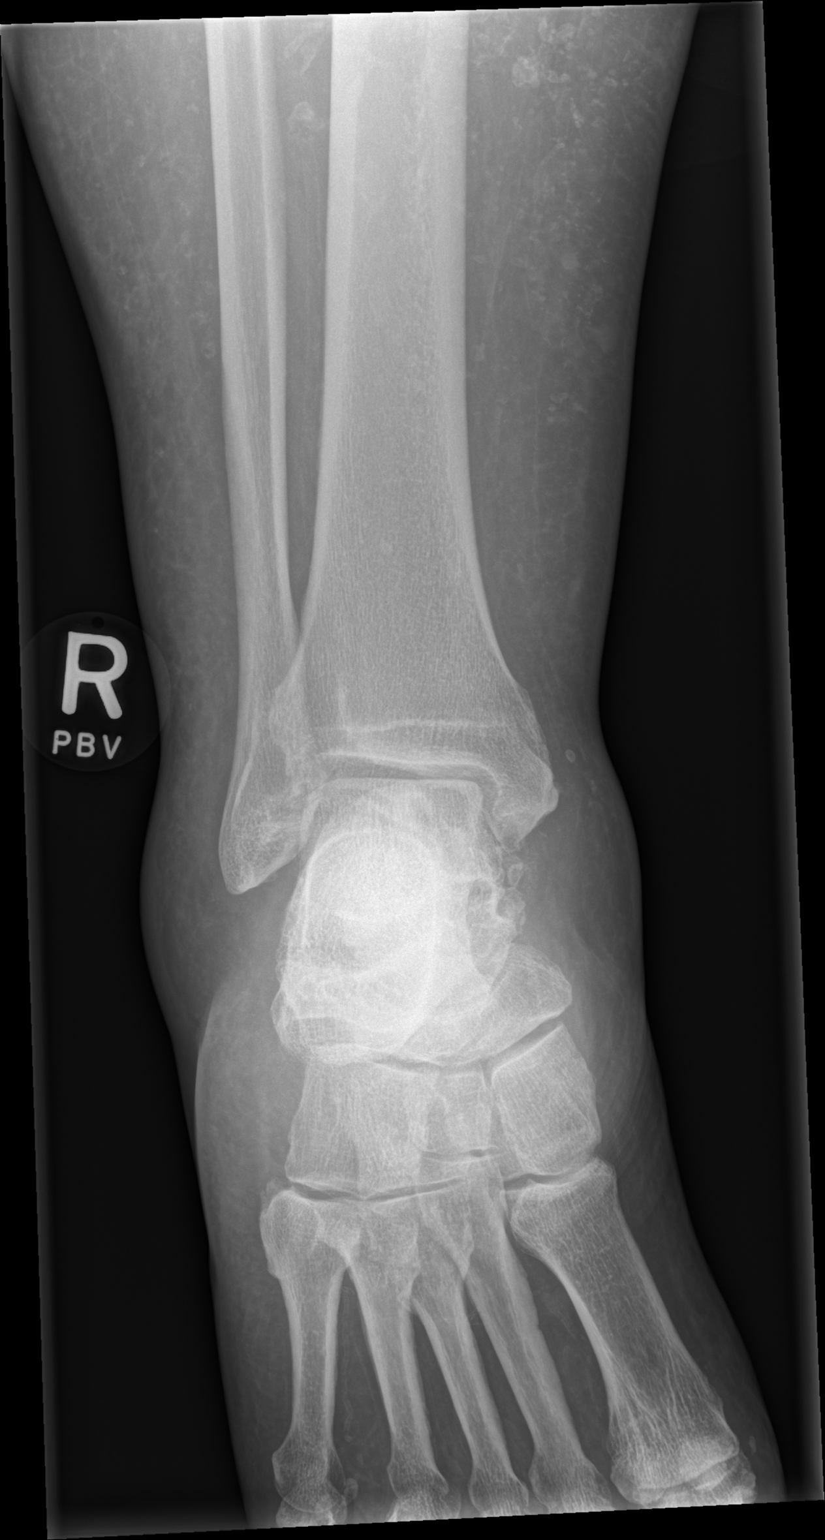

[x ankle obl right]
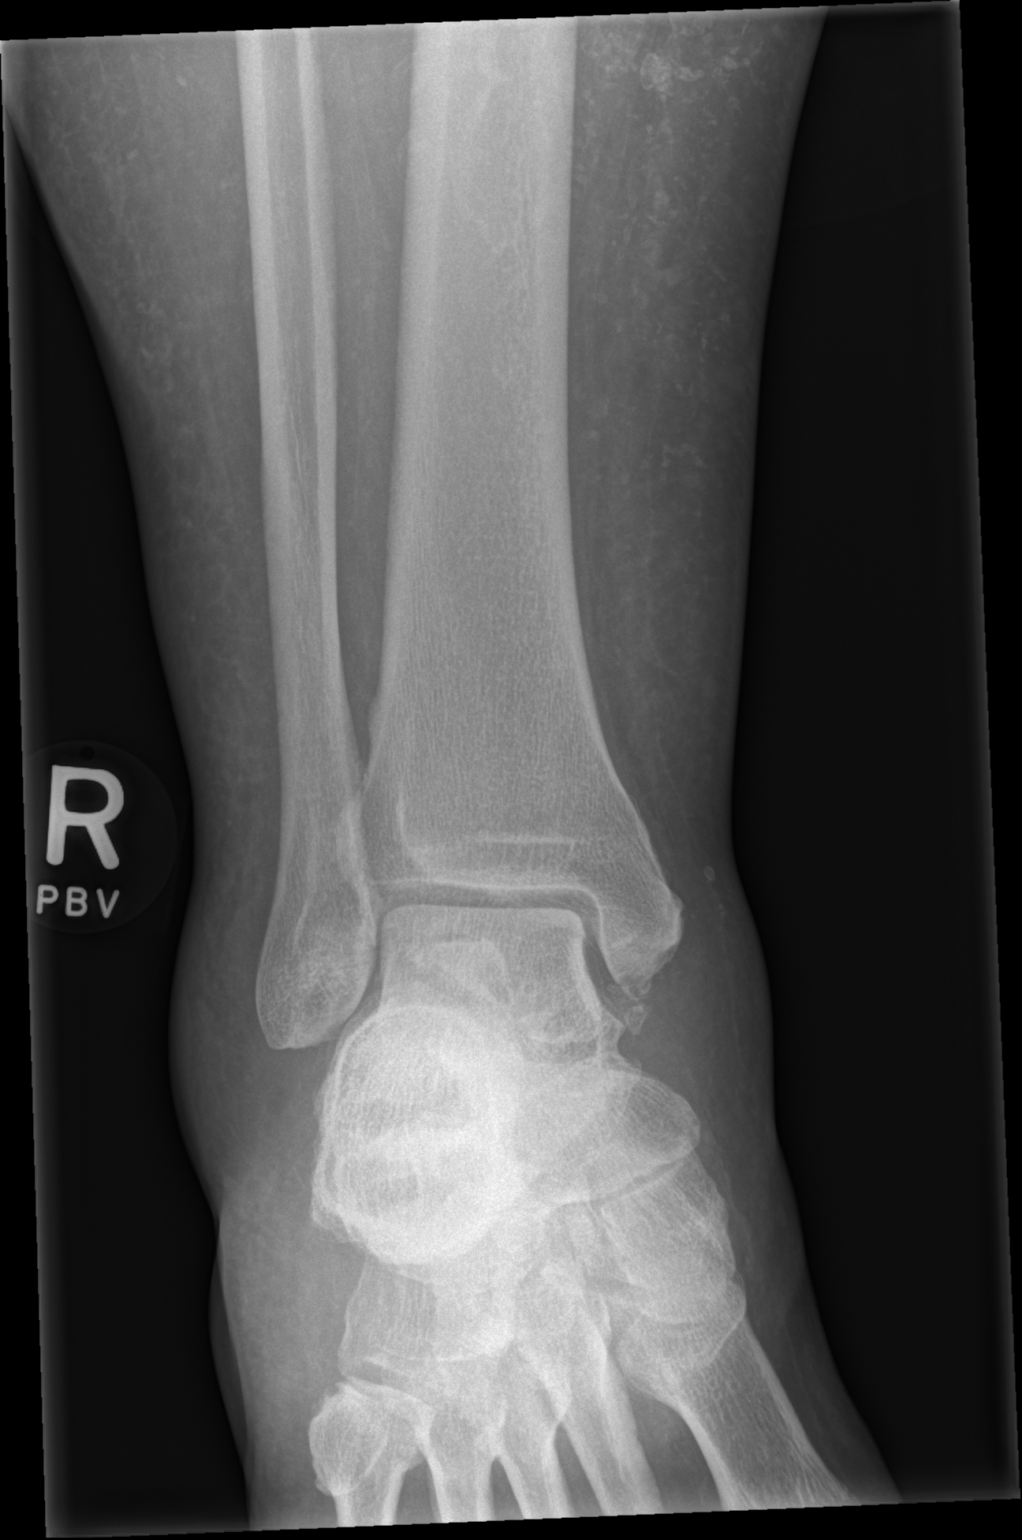

[x ankle lat right]
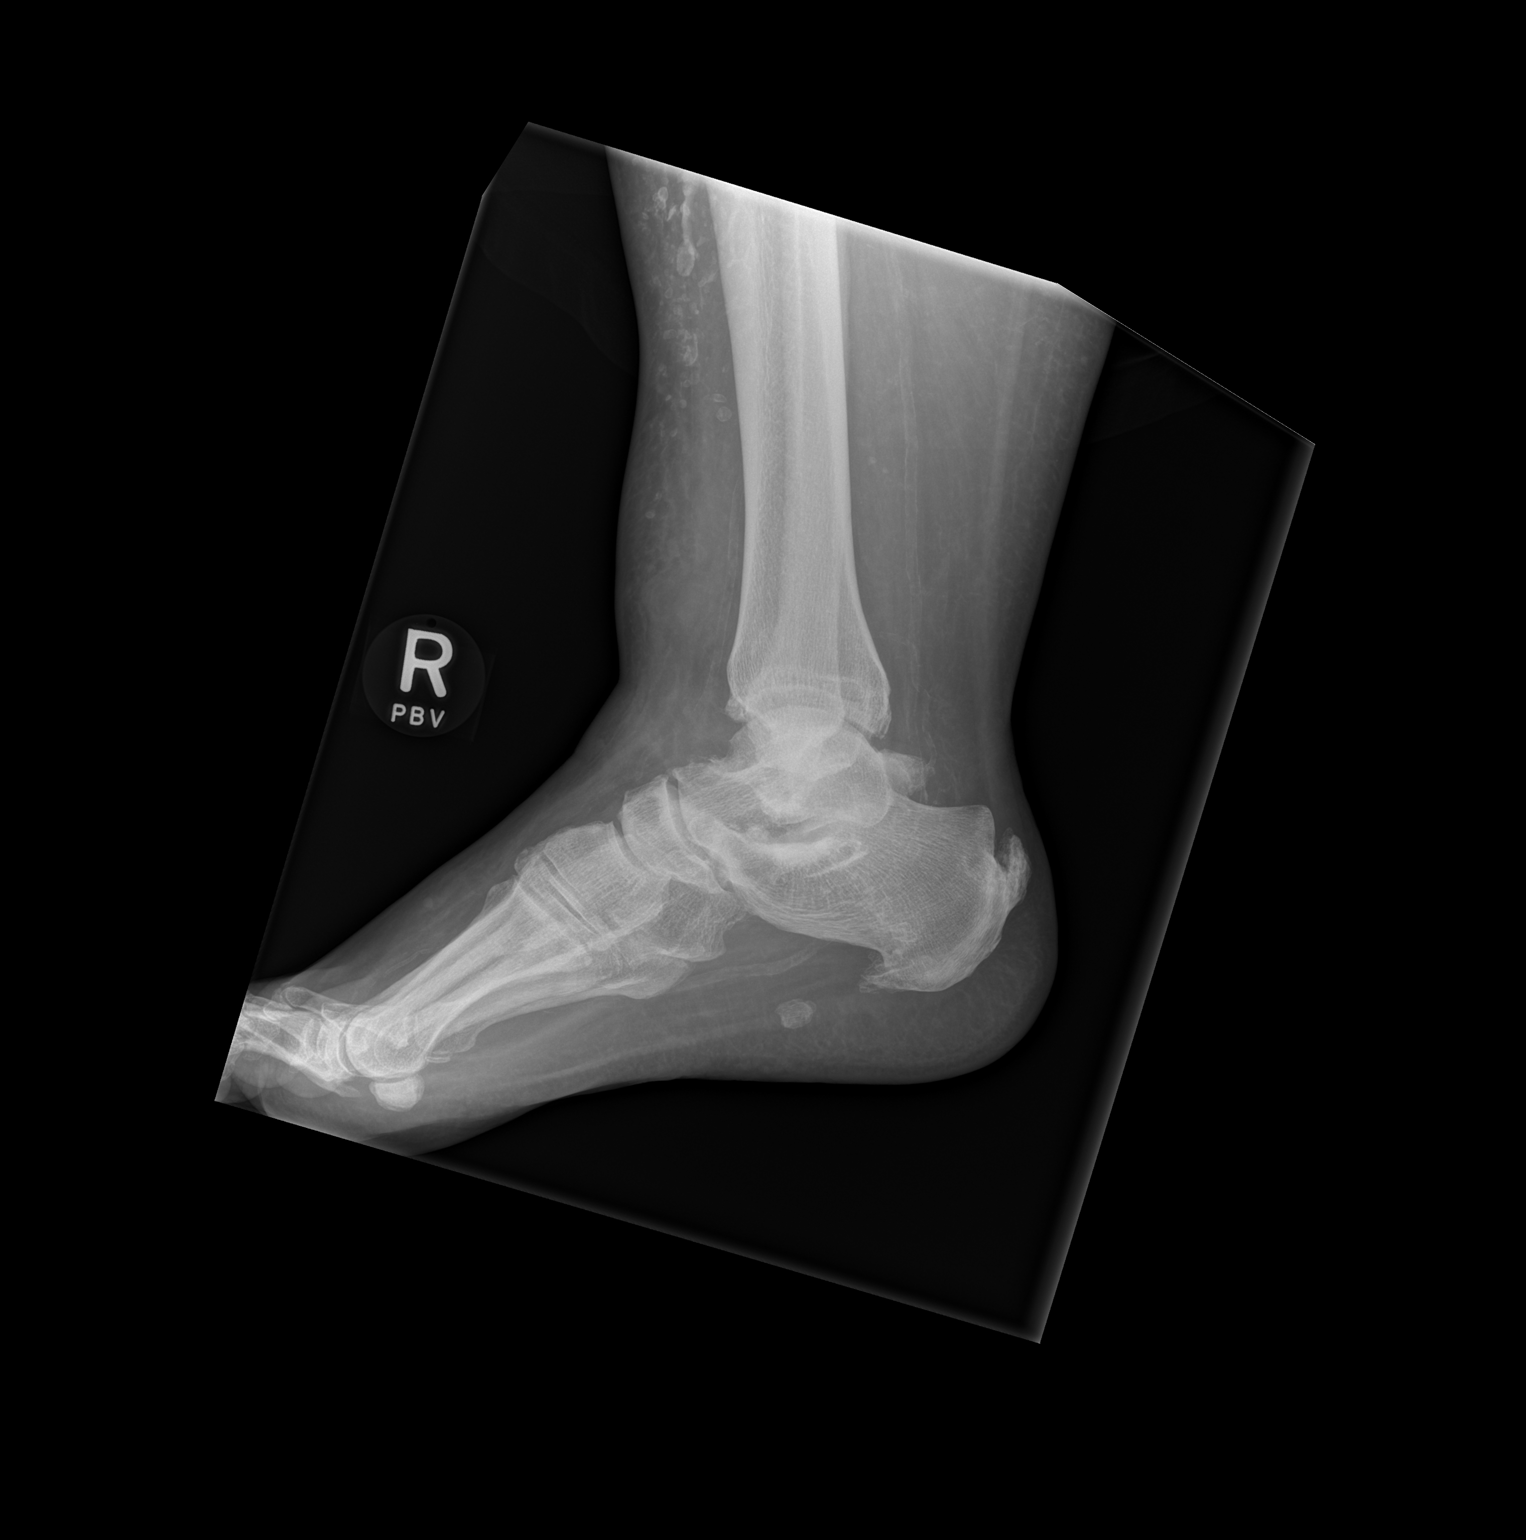

[3 of 3 positions shown; findings below may reference images not displayed]

FINDINGS: Frontal, oblique, and lateral views were obtained. There is
generalized soft tissue swelling. There is no appreciable acute
fracture or joint effusion. Calcification in the medial malleolus
may represent residua of old trauma. There is narrowing in the
medial malleolar region. No erosive change. Ankle mortise appears
intact.

There are posterior inferior calcaneal spurs. There is plantar
calcification posterior to the mid calcaneus. There are multiple
foci of arterial vascular calcification as well as multiple
phleboliths.
IMPRESSION: Diffuse soft tissue swelling. No acute fracture or joint effusion.
Ankle mortise appears intact.

Areas of osteoarthritic change medially. Calcification of the medial
malleolus may have arthropathic etiology but could represent residua
of old trauma. There are calcaneal spurs.

There is plantar calcification. There is extensive arterial vascular
calcification as well as multiple calcified phleboliths in the lower
extremity.

## 2018-11-12 ENCOUNTER — Encounter (INDEPENDENT_AMBULATORY_CARE_PROVIDER_SITE_OTHER): Payer: Self-pay

## 2020-02-12 DIAGNOSIS — H900 Conductive hearing loss, bilateral: Secondary | ICD-10-CM | POA: Insufficient documentation

## 2020-10-26 ENCOUNTER — Telehealth: Payer: Self-pay

## 2020-10-26 NOTE — Telephone Encounter (Signed)
NOTES SCANNED TO REFERRAL 

## 2020-12-13 ENCOUNTER — Ambulatory Visit (INDEPENDENT_AMBULATORY_CARE_PROVIDER_SITE_OTHER): Payer: Medicare Other

## 2020-12-13 ENCOUNTER — Ambulatory Visit (INDEPENDENT_AMBULATORY_CARE_PROVIDER_SITE_OTHER): Payer: Medicare Other | Admitting: Cardiology

## 2020-12-13 ENCOUNTER — Other Ambulatory Visit: Payer: Self-pay

## 2020-12-13 ENCOUNTER — Encounter: Payer: Self-pay | Admitting: Cardiology

## 2020-12-13 VITALS — BP 112/64 | HR 106 | Ht 69.0 in | Wt >= 6400 oz

## 2020-12-13 DIAGNOSIS — I4821 Permanent atrial fibrillation: Secondary | ICD-10-CM

## 2020-12-13 DIAGNOSIS — I10 Essential (primary) hypertension: Secondary | ICD-10-CM

## 2020-12-13 DIAGNOSIS — G4733 Obstructive sleep apnea (adult) (pediatric): Secondary | ICD-10-CM

## 2020-12-13 DIAGNOSIS — I350 Nonrheumatic aortic (valve) stenosis: Secondary | ICD-10-CM

## 2020-12-13 MED ORDER — METOPROLOL SUCCINATE ER 25 MG PO TB24: 25.0000 mg | ORAL_TABLET | Freq: Every day | ORAL | 3 refills | Status: AC

## 2020-12-13 NOTE — Progress Notes (Signed)
Cardiology CONSULT Note    Date:  12/13/2020   ID:  Josie, Mesa [birth year], MRN [id number]  PCP:  Hulan Fess, MD  Cardiologist:  Fransico Him, MD   Chief Complaint  Patient presents with   Atrial Fibrillation   Hypertension   Sleep Apnea     History of Present Illness:  Brian Richmond is a 66 y.o. male who is being seen today for the evaluation of chronic atrial fibrillation to establish cardiac care at the request of Garwin Brothers, MD.  This is a 65yo male with a hx of obesity, colon CA, DM, HTN, mild AS by echo 2018 with mean AVG 29mmHg and moderate LAE.  He also has OSA on CPAP who is referred today to establish cardiac care for his atrial fibrillation.  He was initially on warfarin and changed him to Xarelto but then was changed to Eliquis 5mg  BID.    He is here today and is doing well.  He denies any chest pain or pressure, SOB, DOE, PND, orthopnea, LE edema, dizziness, palpitations or syncope. He is compliant with his meds and is tolerating meds with no SE.     Past Medical History:  Diagnosis Date   Aortic stenosis    mild with mean AVG 34mmHg by echo 2018   Carpal tunnel syndrome    bilateral, and neuropathy bilateral feet   Colon cancer ([place]) 03/2008   surgery only   Diabetes mellitus without complication ([facility])    Hx of seasonal allergies    Hypertension    Neuropathy    Obesity    BMI 68.0 kg   OSA (obstructive sleep apnea)    uses cpap nightly   Reduced mobility    06-27-13 states can transfer self.limited on distances-uses walker and wheelchair mostly.    Past Surgical History:  Procedure Laterality Date   COLON SURGERY  03/2008   COLONOSCOPY WITH PROPOFOL N/A 07/08/2013   Procedure: COLONOSCOPY WITH PROPOFOL;  Surgeon: [name], MD;  Location: [facility];  Service: Endoscopy;  Laterality: N/A;   COLONOSCOPY WITH PROPOFOL N/A 10/13/2014   Procedure: COLONOSCOPY WITH PROPOFOL;  Surgeon: [name], MD;  Location: [facility];   Service: Endoscopy;  Laterality: N/A;   FLEXIBLE SIGMOIDOSCOPY N/A 08/26/2016   Procedure: FLEXIBLE SIGMOIDOSCOPY;  Surgeon: Otis Brace, MD;  Location: [facility];  Service: Gastroenterology;  Laterality: N/A;   FOOT SURGERY     ingrown toenails     Current Medications: Current Meds  Medication Sig   apixaban (ELIQUIS) 5 MG TABS tablet Take 5 mg by mouth 2 (two) times daily.   baclofen (LIORESAL) 10 MG tablet Take 10 mg by mouth at bedtime.   cephALEXin (KEFLEX) 250 MG capsule Take by mouth at bedtime.   CRANBERRY PO Take by mouth. 425mg  daily   Dulaglutide (TRULICITY) 3.41 DQ/2.2WL SOPN Inject into the skin every 7 (seven) days.   DULoxetine (CYMBALTA) 30 MG capsule TAKE 1 CAPSULE EVERY DAY   dutasteride (AVODART) 0.5 MG capsule Take 0.5 mg by mouth daily.   ergocalciferol (VITAMIN D2) 1.25 MG (50000 UT) capsule Take 50,000 Units by mouth every 14 (fourteen) days.   fluticasone (FLONASE) 50 MCG/ACT nasal spray Place 1 spray into both nostrils daily.   gabapentin (NEURONTIN) 100 MG capsule TAKE 1 CAPSULE(100 MG) BY MOUTH THREE TIMES DAILY   glimepiride (AMARYL) 2 MG tablet Take 3 mg by mouth at bedtime.    ibuprofen (ADVIL) 800 MG tablet Take 800 mg  by mouth every 8 (eight) hours as needed for mild pain or moderate pain.   levothyroxine (SYNTHROID, LEVOTHROID) 25 MCG tablet Take 2 tablets (50 mcg total) by mouth daily.   metFORMIN (GLUCOPHAGE-XR) 500 MG 24 hr tablet Take 500 mg by mouth 2 (two) times daily.    morphine (MS CONTIN) 30 MG 12 hr tablet Take 1 tablet (30 mg total) by mouth 3 (three) times daily.   phenazopyridine (PYRIDIUM) 200 MG tablet Take 200 mg by mouth 3 (three) times daily as needed for pain.   pramipexole (MIRAPEX) 0.125 MG tablet Take 1 tablet (0.125 mg total) by mouth at bedtime.   pseudoephedrine (SUDAFED) 30 MG tablet Take 30 mg by mouth every 6 (six) hours as needed for congestion.    senna-docusate (SENOKOT-S) 8.6-50 MG tablet Take 2 tablets by mouth 2  (two) times daily.   Tamsulosin HCl (FLOMAX) 0.4 MG CAPS Take 0.4 mg by mouth daily.    testosterone enanthate (DELATESTRYL) 200 MG/ML injection Inject 100 mg into the muscle every 14 (fourteen) days. For IM use only   traZODone (DESYREL) 50 MG tablet Take 0.5 mg by mouth at bedtime.    Allergies:   Patient has no known allergies.   Social History   Socioeconomic History   Marital status: Married    Spouse name: Not on file   Number of children: y   Years of education: Not on file   Highest education level: Not on file  Occupational History   Occupation: DISABLED    Employer: DISABLED  Tobacco Use   Smoking status: Never   Smokeless tobacco: Never  Vaping Use   Vaping Use: Never used  Substance and Sexual Activity   Alcohol use: No   Drug use: No   Sexual activity: Not on file  Other Topics Concern   Not on file  Social History Narrative   Pt is married and lives with wife.     Social Determinants of Health   Financial Resource Strain: Not on file  Food Insecurity: Not on file  Transportation Needs: Not on file  Physical Activity: Not on file  Stress: Not on file  Social Connections: Not on file     Family History:  The patient's family history includes Arthritis in his father; Cancer in his mother; Heart disease in his mother; Ovarian cancer in his mother.   ROS:   Please see the history of present illness.    ROS All other systems reviewed and are negative.  No flowsheet data found.     PHYSICAL EXAM:   VS:  BP 112/64   Pulse (!) 106   Ht 5\' 9"  (1.753 m)   Wt (!) 436 lb 9.6 oz (198 kg)   SpO2 96%   BMI 64.47 kg/m    GEN: Well nourished, well developed, in no acute distress  HEENT: normal  Neck: no JVD, carotid bruits, or masses Cardiac: RRR; no murmurs, rubs, or gallops,no edema.  Intact distal pulses bilaterally.  Respiratory:  clear to auscultation bilaterally, normal work of breathing GI: soft, nontender, nondistended, + BS MS: no deformity or  atrophy  Skin: warm and dry, no rash Neuro:  Alert and Oriented x 3, Strength and sensation are intact Psych: euthymic mood, full affect  Wt Readings from Last 3 Encounters:  12/13/20 (!) 436 lb 9.6 oz (198 kg)  09/05/16 (!) 432 lb (196 kg)  08/26/16 (!) 433 lb 6.4 oz (196.6 kg)      Studies/Labs Reviewed:  EKG:  EKG is ordered today.  The ekg ordered today demonstrates atrial fibrillation with RVR at 106bpm and nonspecific T wave abnormality  Recent Labs: No results found for requested labs within last 8760 hours.   Lipid Panel    Component Value Date/Time   CHOL 174 08/24/2016 0317   TRIG 110 08/24/2016 0317   HDL 32 (L) 08/24/2016 0317   CHOLHDL 5.4 08/24/2016 0317   VLDL 22 08/24/2016 0317   LDLCALC 120 (H) 08/24/2016 0317     CHA2DS2-VASc Score = 3  This indicates a 3.2% annual risk of stroke. The patient's score is based upon: CHF History: 0 HTN History: 1 Diabetes History: 1 Stroke History: 0 Vascular Disease History: 0 Age Score: 1 Gender Score: 0   Additional studies/ records that were reviewed today include:  OV notes from PCP    ASSESSMENT:    1. Permanent atrial fibrillation ([place])   2. Primary hypertension   3. OSA (obstructive sleep apnea)   4. Nonrheumatic aortic valve stenosis      PLAN:  In order of problems listed above:  Permanent atrial fibrillation -HR is well controlled on exam today -his CHADS2VASC score is 3 and on Eliquis with no bleeding problems -he had been on Lopressor 25mg  BID but this was stopped he thinks to try to cut back on the number of meds he was taking.  -will start Toprol XL 25mg  daily for HR control (HR 106bpm today) -I will get a 3 day Ziopatch to assess adequacy of HR control -continue Eliquis 5mg  BID -check BMET and CBC  2.  HTN -BP controlled on exam today -He was on Lisinopril and Lopressor but these were stopped to try to cut back on some meds -starting Toprol for HR control  3.  OSA -tolerating  PAP therapy  4.  Aortic stenosis -mild by echo 2018 with mean AVG 47mmHg -repeat echo to make sure this is stable  Time Spent: 20 minutes total time of encounter, including 15 minutes spent in face-to-face patient care on the date of this encounter. This time includes coordination of care and counseling regarding above mentioned problem list. Remainder of non-face-to-face time involved reviewing chart documents/testing relevant to the patient encounter and documentation in the medical record. I have independently reviewed documentation from referring provider  Medication Adjustments/Labs and Tests Ordered: Current medicines are reviewed at length with the patient today.  Concerns regarding medicines are outlined above.  Medication changes, Labs and Tests ordered today are listed in the Patient Instructions below.  There are no Patient Instructions on file for this visit.   Signed, Fransico Him, MD  12/13/2020 1:23 PM    [facility] [address], [place], [place]  [address] Phone: [phone]; Fax: [phone]

## 2020-12-13 NOTE — Patient Instructions (Signed)
Medication Instructions:  Your physician has recommended you make the following change in your medication:  1) START taking Toprol XL (metoprolol succinate) 25 mg daily   *If you need a refill on your cardiac medications before your next appointment, please call your pharmacy*  Lab Work: TODAY: BMET, CBC If you have labs (blood work) drawn today and your tests are completely normal, you will receive your results only by: [place] (if you have MyChart) OR A paper copy in the mail If you have any lab test that is abnormal or we need to change your treatment, we will call you to review the results.   Testing/Procedures: Your physician has requested that you have an echocardiogram. Echocardiography is a painless test that uses sound waves to create images of your heart. It provides your doctor with information about the size and shape of your heart and how well your heart's chambers and valves are working. This procedure takes approximately one hour. There are no restrictions for this procedure.  Your physician has recommended that you wear an event monitor. Event monitors are medical devices that record the heart's electrical activity. Doctors most often [place] these monitors to diagnose arrhythmias. Arrhythmias are problems with the speed or rhythm of the heartbeat. The monitor is a small, portable device. You can wear one while you do your normal daily activities. This is usually used to diagnose what is causing palpitations/syncope (passing out).  Follow-Up: At [facility], you and your health needs are our priority.  As part of our continuing mission to provide you with exceptional heart care, we have created designated Provider Care Teams.  These Care Teams include your primary Cardiologist (physician) and Advanced Practice Providers (APPs -  Physician Assistants and Nurse Practitioners) who all work together to provide you with the care you need, when you need it.  Your next  appointment:   1 year(s)  The format for your next appointment:   In Person  Provider:   Fransico Him, MD     Other Instructions ZIO XT- Long Term Monitor Instructions  Your physician has requested you wear a ZIO patch monitor for 3 days.  This is a single patch monitor. [employer] supplies one patch monitor per enrollment. Additional stickers are not available. Please do not apply patch if you will be having a Nuclear Stress Test,  Echocardiogram, Cardiac CT, MRI, or Chest Xray during the period you would be wearing the  monitor. The patch cannot be worn during these tests. You cannot remove and re-apply the  ZIO XT patch monitor.  Your ZIO patch monitor will be mailed 3 day USPS to your address on file. It may take 3-5 days  to receive your monitor after you have been enrolled.  Once you have received your monitor, please review the enclosed instructions. Your monitor  has already been registered assigning a specific monitor serial # to you.  Billing and Patient Assistance Program Information  We have supplied [employer] with any of your insurance information on file for billing purposes. [employer] offers a sliding scale Patient Assistance Program for patients that do not have  insurance, or whose insurance does not completely cover the cost of the ZIO monitor.  You must apply for the Patient Assistance Program to qualify for this discounted rate.  To apply, please call [employer] at [phone], select option 4, select option 2, ask to apply for  Patient Assistance Program. Theodore Demark will ask your household income, and how many people  are in your  household. They will quote your out-of-pocket cost based on that information.  [employer] will also be able to set up a 52-month, interest-free payment plan if needed.  Applying the monitor   Shave hair from upper left chest.  Hold abrader disc by orange tab. Rub abrader in 40 strokes over the upper left chest as  indicated in your monitor  instructions.  Clean area with 4 enclosed alcohol pads. Let dry.  Apply patch as indicated in monitor instructions. Patch will be placed under collarbone on left  side of chest with arrow pointing upward.  Rub patch adhesive wings for 2 minutes. Remove white label marked "1". Remove the white  label marked "2". Rub patch adhesive wings for 2 additional minutes.  While looking in a mirror, press and release button in center of patch. A small green light will  flash 3-4 times. This will be your only indicator that the monitor has been turned on.  Do not shower for the first 24 hours. You may shower after the first 24 hours.  Press the button if you feel a symptom. You will hear a small click. Record Date, Time and  Symptom in the Patient Logbook.  When you are ready to remove the patch, follow instructions on the last 2 pages of Patient  Logbook. Stick patch monitor onto the last page of Patient Logbook.  Place Patient Logbook in the blue and white box. Use locking tab on box and tape box closed  securely. The blue and white box has prepaid postage on it. Please place it in the mailbox as  soon as possible. Your physician should have your test results approximately 7 days after the  monitor has been mailed back to [facility].  Call [place] at [phone] if you have questions regarding  your ZIO XT patch monitor. Call them immediately if you see an orange light blinking on your  monitor.  If your monitor falls off in less than 4 days, contact our Monitor department at [phone].  If your monitor becomes loose or falls off after 4 days call [employer] at [phone] for  suggestions on securing your monitor

## 2020-12-13 NOTE — Progress Notes (Unsigned)
Enrolled for [employer] to mail a ZIO XT long term holter monitor to the patients address on file.  

## 2020-12-13 NOTE — Addendum Note (Signed)
Addended by: Antonieta Iba on: 12/13/2020 01:28 PM   Modules accepted: Orders

## 2020-12-14 LAB — BASIC METABOLIC PANEL
BUN/Creatinine Ratio: 14 (ref 10–24)
BUN: 12 mg/dL (ref 8–27)
CO2: 24 mmol/L (ref 20–29)
Calcium: 8.8 mg/dL (ref 8.6–10.2)
Chloride: 101 mmol/L (ref 96–106)
Creatinine, Ser: 0.84 mg/dL (ref 0.76–1.27)
Glucose: 166 mg/dL — ABNORMAL HIGH (ref 70–99)
Potassium: 4.5 mmol/L (ref 3.5–5.2)
Sodium: 137 mmol/L (ref 134–144)
eGFR: 96 mL/min/{1.73_m2} (ref >59)

## 2020-12-14 LAB — CBC
Hematocrit: 40.2 % (ref 37.5–51.0)
Hemoglobin: 13.2 g/dL (ref 13.0–17.7)
MCH: 30 pg (ref 26.6–33.0)
MCHC: 32.8 g/dL (ref 31.5–35.7)
MCV: 91 fL (ref 79–97)
Platelets: 292 10*3/uL (ref 150–450)
RBC: 4.4 x10E6/uL (ref 4.14–5.80)
RDW: 13.1 % (ref 11.6–15.4)
WBC: 8.1 10*3/uL (ref 3.4–10.8)

## 2020-12-18 DIAGNOSIS — I350 Nonrheumatic aortic (valve) stenosis: Secondary | ICD-10-CM

## 2020-12-18 DIAGNOSIS — I4821 Permanent atrial fibrillation: Secondary | ICD-10-CM | POA: Diagnosis not present

## 2020-12-18 DIAGNOSIS — G4733 Obstructive sleep apnea (adult) (pediatric): Secondary | ICD-10-CM

## 2020-12-18 DIAGNOSIS — I10 Essential (primary) hypertension: Secondary | ICD-10-CM | POA: Diagnosis not present

## 2021-01-24 ENCOUNTER — Ambulatory Visit (HOSPITAL_COMMUNITY)
Admission: RE | Admit: 2021-01-24 | Discharge: 2021-01-24 | Disposition: A | Discharge status: Home / Self Care | Payer: Medicare Other | Source: Ambulatory Visit | Attending: Cardiology | Admitting: Cardiology

## 2021-01-24 DIAGNOSIS — I4821 Permanent atrial fibrillation: Secondary | ICD-10-CM | POA: Diagnosis not present

## 2021-01-24 DIAGNOSIS — I10 Essential (primary) hypertension: Secondary | ICD-10-CM | POA: Insufficient documentation

## 2021-01-24 DIAGNOSIS — E119 Type 2 diabetes mellitus without complications: Secondary | ICD-10-CM | POA: Diagnosis not present

## 2021-01-24 DIAGNOSIS — I350 Nonrheumatic aortic (valve) stenosis: Secondary | ICD-10-CM | POA: Insufficient documentation

## 2021-01-24 DIAGNOSIS — G4733 Obstructive sleep apnea (adult) (pediatric): Secondary | ICD-10-CM | POA: Diagnosis not present

## 2021-01-24 LAB — ECHOCARDIOGRAM COMPLETE
AV Mean grad: 4 mmHg
AV Peak grad: 7.3 mmHg
Ao pk vel: 1.35 m/s

## 2021-01-24 MED ORDER — PERFLUTREN LIPID MICROSPHERE
1.0000 mL | INTRAVENOUS | Status: AC | PRN
  Administered 2021-01-24: 3 mL via INTRAVENOUS
  Filled 2021-01-24: qty 10

## 2021-02-08 ENCOUNTER — Telehealth: Payer: Self-pay

## 2021-02-08 DIAGNOSIS — I350 Nonrheumatic aortic (valve) stenosis: Secondary | ICD-10-CM

## 2021-02-08 NOTE — Telephone Encounter (Signed)
-----   Message from Sueanne Margarita, MD sent at 01/24/2021  3:51 PM EST ----- Echo showed normal LVF but otherwise very poor windows and study was nondiagnostic.  Please set up repeat limited echo with Brian Richmond to try to get bett images

## 2021-02-08 NOTE — Telephone Encounter (Signed)
The patient has been notified of the result and verbalized understanding.  All questions (if any) were answered. Antonieta Iba, RN 02/08/2021 4:46 PM  Repeat limited echo has been ordered.

## 2021-02-24 ENCOUNTER — Ambulatory Visit (HOSPITAL_COMMUNITY): Payer: Medicare Other | Attending: Cardiology

## 2021-02-24 ENCOUNTER — Encounter: Payer: Self-pay | Admitting: Cardiology

## 2021-02-24 ENCOUNTER — Other Ambulatory Visit: Payer: Self-pay

## 2021-02-24 DIAGNOSIS — I350 Nonrheumatic aortic (valve) stenosis: Secondary | ICD-10-CM | POA: Diagnosis present

## 2021-02-24 LAB — ECHOCARDIOGRAM LIMITED
AV Mean grad: 24 mmHg
AV Peak grad: 39.1 mmHg
Ao pk vel: 3.13 m/s

## 2021-02-24 MED ORDER — PERFLUTREN LIPID MICROSPHERE
1.0000 mL | INTRAVENOUS | Status: AC | PRN
  Administered 2021-02-24: 3 mL via INTRAVENOUS

## 2021-02-25 ENCOUNTER — Telehealth: Payer: Self-pay | Admitting: Cardiology

## 2021-02-25 DIAGNOSIS — I350 Nonrheumatic aortic (valve) stenosis: Secondary | ICD-10-CM

## 2021-02-25 NOTE — Telephone Encounter (Signed)
Returned call to patient who had called requesting to review ECHO results. Per DPR, left detailed message on machine. Order placed at this time for repeat ECHO in 6 months. Asked pt to return call to office with any questions.

## 2021-02-25 NOTE — Telephone Encounter (Signed)
Patient is returning call to discuss echo results. °

## 2021-03-02 ENCOUNTER — Telehealth: Payer: Self-pay | Admitting: Cardiology

## 2021-03-02 NOTE — Telephone Encounter (Signed)
A user error has taken place: encounter opened in error, closed for administrative reasons.

## 2021-08-25 ENCOUNTER — Ambulatory Visit (HOSPITAL_COMMUNITY): Payer: Medicare Other | Attending: Internal Medicine

## 2021-08-25 DIAGNOSIS — I350 Nonrheumatic aortic (valve) stenosis: Secondary | ICD-10-CM | POA: Insufficient documentation

## 2021-08-25 LAB — ECHOCARDIOGRAM COMPLETE
AR max vel: 1.37 cm2
AV Area VTI: 1.23 cm2
AV Area mean vel: 1.51 cm2
AV Mean grad: 23.5 mmHg
AV Peak grad: 41.2 mmHg
Ao pk vel: 3.21 m/s
S' Lateral: 3.9 cm

## 2021-08-25 MED ORDER — PERFLUTREN LIPID MICROSPHERE
1.0000 mL | INTRAVENOUS | Status: AC | PRN
  Administered 2021-08-25: 1 mL via INTRAVENOUS

## 2021-08-29 ENCOUNTER — Telehealth: Payer: Self-pay | Admitting: Cardiology

## 2021-08-29 NOTE — Telephone Encounter (Signed)
Pt is returning call in regards to results. Requesting call back.  

## 2021-08-29 NOTE — Telephone Encounter (Signed)
The patient has been notified of the result and verbalized understanding.  All questions (if any) were answered. Antonieta Iba, RN 08/29/2021 4:12 PM  Patient has been scheduled to see Ambrose Pancoast, NP on 8/16

## 2021-08-29 NOTE — Telephone Encounter (Signed)
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    Aortic root has increased from 38 to 4113m please get a gated chest CTA.    I would like him set up for a TEE for better assessment of AS>>he will need to be set up with PA for TEE workuip

## 2021-08-30 NOTE — Progress Notes (Unsigned)
Office Visit    Patient Name: Brian Richmond Date of Encounter: 08/31/2021  Primary Care Provider:  Hulan Fess, MD Primary Cardiologist:  Fransico Him, MD Primary Electrophysiologist: None  Chief Complaint    Brian Richmond is a 66 y.o. male with PMH of chronic AF, HTN, DM, moderate AS, hypothyroidism, OSA on CPAP, obesity, colon cancer (s/p partial colon resection), BPH who is seen today for management of aortic stenosis.  Past Medical History    Past Medical History:  Diagnosis Date   Aortic stenosis    moderate by echo 02/2021   Carpal tunnel syndrome    bilateral, and neuropathy bilateral feet   Colon cancer ([place]) 03/2008   surgery only   Diabetes mellitus without complication (HCC)    Hx of seasonal allergies    Hypertension    Neuropathy    Obesity    BMI 68.0 kg   OSA (obstructive sleep apnea)    uses cpap nightly   Reduced mobility    06-27-13 states can transfer self.limited on distances-uses walker and wheelchair mostly.   Past Surgical History:  Procedure Laterality Date   COLON SURGERY  03/2008   COLONOSCOPY WITH PROPOFOL N/A 07/08/2013   Procedure: COLONOSCOPY WITH PROPOFOL;  Surgeon: [name], MD;  Location: [facility];  Service: Endoscopy;  Laterality: N/A;   COLONOSCOPY WITH PROPOFOL N/A 10/13/2014   Procedure: COLONOSCOPY WITH PROPOFOL;  Surgeon: [name], MD;  Location: [facility];  Service: Endoscopy;  Laterality: N/A;   FLEXIBLE SIGMOIDOSCOPY N/A 08/26/2016   Procedure: FLEXIBLE SIGMOIDOSCOPY;  Surgeon: Otis Brace, MD;  Location: [place];  Service: Gastroenterology;  Laterality: N/A;   FOOT SURGERY     ingrown toenails     Allergies  No Known Allergies  History of Present Illness    Brian Richmond is a 66 year old male who was recently seen by Dr. Sallyanne Kuster 1 and 2018 for evaluation of new onset atrial fibrillation.  He was seen in the ED for evaluation of generalized weakness and rectal bleeding.  Upon arrival  to the ED patient was found to be in Afib RVR with rates in the 140-150s. Cardizem drip was started and PO lopressor given. He is now rate controlled and has had episodes of bradycardia. HR is now in the 60s on 5 mg/hr IV dilt. Of note, D-dimer was positive. He was initially on warfarin and changed him to Xarelto but then was changed to Eliquis '5mg'$  BID.  2D echo completed 2018 with mean AVG 26mHg and moderate LAE.  He was seen by Dr. TRadford Paxin 2022 for follow-up and to establish care.  He was started on Toprol XL and given 3-day ZIO to evaluate heart rate control.  2D echo was also repeated showing EF of 65-70%, moderate AAS with plans to repeat in 6 months.  ZIO monitor showed adequate heart rate control.  Most recent 2D echo was completed 08/2021 showing EF of 60-65%, no RWMA, moderate AAS with mild LVH.  There was also noted a mildly dilated ascending aorta at 45 mm and aortic root at 41 mm.  Patient will require gated chest CTA for evaluation of increased aortic and presents today for TEE work-up.    Mr. BReserpresents today to discuss TEE procedure per Dr. TRadford Pax  He presents today alone from his facility.  He denies angina, syncope, presyncope, shortness of breath.  We discussed and reviewed his recent echo results.  He is tolerating his current medication regimen and blood  pressure today was 122/80.  He is currently in rate controlled atrial fibrillation.  Patient denies chest pain, palpitations, dyspnea, PND, orthopnea, nausea, vomiting, dizziness, syncope, edema, weight gain, or early satiety.   Home Medications    Current Outpatient Medications  Medication Sig Dispense Refill   apixaban (ELIQUIS) 5 MG TABS tablet Take 5 mg by mouth 2 (two) times daily.     baclofen (LIORESAL) 10 MG tablet Take 10 mg by mouth at bedtime.     bisacodyl (DULCOLAX) 5 MG EC tablet Take 2 tablets (10 mg total) by mouth daily as needed for moderate constipation. 30 tablet 0   cephALEXin (KEFLEX) 250 MG capsule  Take by mouth at bedtime.     CRANBERRY PO Take by mouth. '425mg'$  daily     cyclobenzaprine (FLEXERIL) 10 MG tablet Take 1 tablet (10 mg total) by mouth 3 (three) times daily as needed for muscle spasms. 30 tablet 0   Dulaglutide (TRULICITY) 2.99 BZ/1.6RC SOPN Inject into the skin every 7 (seven) days.     DULoxetine (CYMBALTA) 30 MG capsule TAKE 1 CAPSULE EVERY DAY 90 capsule 1   dutasteride (AVODART) 0.5 MG capsule Take 0.5 mg by mouth daily.     ergocalciferol (VITAMIN D2) 1.25 MG (50000 UT) capsule Take 50,000 Units by mouth every 14 (fourteen) days.     fluticasone (FLONASE) 50 MCG/ACT nasal spray Place 1 spray into both nostrils daily.     gabapentin (NEURONTIN) 100 MG capsule TAKE 1 CAPSULE(100 MG) BY MOUTH THREE TIMES DAILY 90 capsule 0   glimepiride (AMARYL) 2 MG tablet Take 3 mg by mouth at bedtime.      hydrocortisone (ANUSOL-HC) 25 MG suppository Place 1 suppository (25 mg total) rectally 2 (two) times daily. 12 suppository 0   ibuprofen (ADVIL) 800 MG tablet Take 800 mg by mouth every 8 (eight) hours as needed for mild pain or moderate pain.     levothyroxine (SYNTHROID, LEVOTHROID) 25 MCG tablet Take 2 tablets (50 mcg total) by mouth daily. 30 tablet 0   lisinopril (PRINIVIL,ZESTRIL) 10 MG tablet Take 10 mg by mouth every morning.     metFORMIN (GLUCOPHAGE-XR) 500 MG 24 hr tablet Take 500 mg by mouth 2 (two) times daily.      metoprolol succinate (TOPROL XL) 25 MG 24 hr tablet Take 1 tablet (25 mg total) by mouth daily. 90 tablet 3   morphine (MS CONTIN) 30 MG 12 hr tablet Take 1 tablet (30 mg total) by mouth 3 (three) times daily. 90 tablet 0   nystatin (MYCOSTATIN) 100000 UNIT/ML suspension   1   oxybutynin (DITROPAN) 5 MG tablet Take 5 mg by mouth 3 (three) times daily.     oxyCODONE-acetaminophen (PERCOCET) 7.5-325 MG tablet Take 1 tablet by mouth every 6 (six) hours as needed. 90 tablet 0   phenazopyridine (PYRIDIUM) 200 MG tablet Take 200 mg by mouth 3 (three) times daily as  needed for pain.     polyethylene glycol (MIRALAX / GLYCOLAX) packet Take 17 g by mouth 2 (two) times daily. 14 each 0   pramipexole (MIRAPEX) 0.125 MG tablet Take 1 tablet (0.125 mg total) by mouth at bedtime. 30 tablet 0   pseudoephedrine (SUDAFED) 30 MG tablet Take 30 mg by mouth every 6 (six) hours as needed for congestion.      senna-docusate (SENOKOT-S) 8.6-50 MG tablet Take 2 tablets by mouth 2 (two) times daily. 60 tablet 0   Tamsulosin HCl (FLOMAX) 0.4 MG CAPS Take 0.4 mg by mouth  daily.      testosterone enanthate (DELATESTRYL) 200 MG/ML injection Inject 100 mg into the muscle every 14 (fourteen) days. For IM use only     traZODone (DESYREL) 50 MG tablet Take 0.5 mg by mouth at bedtime.     No current facility-administered medications for this visit.     Review of Systems  Please see the history of present illness.    (+) Left knee pain  All other systems reviewed and are otherwise negative except as noted above.  Physical Exam    Wt Readings from Last 3 Encounters:  08/31/21 (!) 436 lb (197.8 kg)  12/13/20 (!) 436 lb 9.6 oz (198 kg)  09/05/16 (!) 432 lb (196 kg)   VS: Vitals:   08/31/21 1301  BP: 122/80  Pulse: 99  SpO2: 91%  ,Body mass index is 64.39 kg/m.  Constitutional:      Appearance: Morbidly obese not in distress.  Neck:     Vascular: JVD normal.  Pulmonary:     Effort: Pulmonary effort is normal.     Breath sounds: No wheezing. No rales. Diminished in the bases Cardiovascular:     Irregularly irregular normal S1. Normal S2.      Murmurs: There is no murmur.  Edema:    Peripheral edema absent.  Abdominal:     Palpations: Abdomen is soft non tender. There is no hepatomegaly.  Skin:    General: Skin is warm and dry.  Neurological:     General: No focal deficit present.     Mental Status: Alert and oriented to person, place and time.     Cranial Nerves: Cranial nerves are intact.  EKG/LABS/Other Studies Reviewed    ECG personally reviewed by me  today -atrial fibrillation with rate of 99 and nonspecific T wave changes in leads V3 and V4 similar to previous EKG  Risk Assessment/Calculations:    CHA2DS2-VASc Score = 3   This indicates a 3.2% annual risk of stroke. The patient's score is based upon: CHF History: 0 HTN History: 1 Diabetes History: 1 Stroke History: 0 Vascular Disease History: 0 Age Score: 1 Gender Score: 0     Lab Results  Component Value Date   WBC 8.1 12/13/2020   HGB 13.2 12/13/2020   HCT 40.2 12/13/2020   MCV 91 12/13/2020   PLT 292 12/13/2020   Lab Results  Component Value Date   CREATININE 0.84 12/13/2020   BUN 12 12/13/2020   NA 137 12/13/2020   K 4.5 12/13/2020   CL 101 12/13/2020   CO2 24 12/13/2020   No results found for: "ALT", "AST", "GGT", "ALKPHOS", "BILITOT" Lab Results  Component Value Date   CHOL 174 08/24/2016   HDL 32 (L) 08/24/2016   LDLCALC 120 (H) 08/24/2016   TRIG 110 08/24/2016   CHOLHDL 5.4 08/24/2016    Lab Results  Component Value Date   HGBA1C 5.8 (H) 08/24/2016    Assessment & Plan    1.  Aortic stenosis: -2D echo completed on 08/25/2021 moderate aortic stenosis.  Patient to undergo TEE for further evaluation -He is euvolemic on examination today -Patient denies angina, syncope, presyncope, shortness of breath today -Orders written and CBC, BMET to be drawn tomorrow by patient's facility.  2.  Permanent atrial fibrillation: -Patient is in rate controlled atrial fibrillation today  -He is tolerating his anticoagulation without any bleeding concerns -Continue Eliquis 5 mg twice daily -Continue Toprol-XL 25 mg CHA2DS2-VASc Score = 3 [CHF History: 0, HTN History: 1, Diabetes  History: 1, Stroke History: 0, Vascular Disease History: 0, Age Score: 1, Gender Score: 0].  Therefore, the patient's annual risk of stroke is 3.2 %.      3.  HTN: -Blood pressure today is well controlled at 122/80 -Continue Toprol-XL as noted above and lisinopril 10 mg -Patient was  unsure if still taking lisinopril and will contact office for confirmation when she returns home.  4.  OSA: -Patient reports compliance with CPAP nightly  5.  Morbid obesity: -BMI is 64.39 kg/m -Patient is encouraged to lose weight and increase physical activity as tolerated  Disposition: Follow-up with Fransico Him, MD or APP in 12  months Shared Decision Making/Informed Consent The risks [esophageal damage, perforation (1:10,000 risk), bleeding, pharyngeal hematoma as well as other potential complications associated with conscious sedation including aspiration, arrhythmia, respiratory failure and death], benefits (treatment guidance and diagnostic support) and alternatives of a transesophageal echocardiogram were discussed in detail with Brian Richmond and he is willing to proceed.    Medication Adjustments/Labs and Tests Ordered: Current medicines are reviewed at length with the patient today.  Concerns regarding medicines are outlined above.   Signed, Mable Fill, Marissa Nestle, NP 08/31/2021, 1:40 PM [place]

## 2021-08-30 NOTE — H&P (View-Only) (Signed)
Office Visit    Patient Name: Brian Richmond Date of Encounter: 08/31/2021  Primary Care Provider:  Hulan Fess, MD Primary Cardiologist:  Fransico Him, MD Primary Electrophysiologist: None  Chief Complaint    Brian Richmond is a 66 y.o. male with PMH of chronic AF, HTN, DM, moderate AS, hypothyroidism, OSA on CPAP, obesity, colon cancer (s/p partial colon resection), BPH who is seen today for management of aortic stenosis.  Past Medical History    Past Medical History:  Diagnosis Date   Aortic stenosis    moderate by echo 02/2021   Carpal tunnel syndrome    bilateral, and neuropathy bilateral feet   Colon cancer ([place]) 03/2008   surgery only   Diabetes mellitus without complication (HCC)    Hx of seasonal allergies    Hypertension    Neuropathy    Obesity    BMI 68.0 kg   OSA (obstructive sleep apnea)    uses cpap nightly   Reduced mobility    06-27-13 states can transfer self.limited on distances-uses walker and wheelchair mostly.   Past Surgical History:  Procedure Laterality Date   COLON SURGERY  03/2008   COLONOSCOPY WITH PROPOFOL N/A 07/08/2013   Procedure: COLONOSCOPY WITH PROPOFOL;  Surgeon: [name], MD;  Location: [facility];  Service: Endoscopy;  Laterality: N/A;   COLONOSCOPY WITH PROPOFOL N/A 10/13/2014   Procedure: COLONOSCOPY WITH PROPOFOL;  Surgeon: [name], MD;  Location: [facility];  Service: Endoscopy;  Laterality: N/A;   FLEXIBLE SIGMOIDOSCOPY N/A 08/26/2016   Procedure: FLEXIBLE SIGMOIDOSCOPY;  Surgeon: Otis Brace, MD;  Location: [facility];  Service: Gastroenterology;  Laterality: N/A;   FOOT SURGERY     ingrown toenails     Allergies  No Known Allergies  History of Present Illness    Brian Richmond is a 66 year old male who was recently seen by Dr. Sallyanne Kuster 1 and 2018 for evaluation of new onset atrial fibrillation.  He was seen in the ED for evaluation of generalized weakness and rectal bleeding.  Upon arrival  to the ED patient was found to be in Afib RVR with rates in the 140-150s. Cardizem drip was started and PO lopressor given. He is now rate controlled and has had episodes of bradycardia. HR is now in the 60s on 5 mg/hr IV dilt. Of note, D-dimer was positive. He was initially on warfarin and changed him to Xarelto but then was changed to Eliquis '5mg'$  BID.  2D echo completed 2018 with mean AVG 51mHg and moderate LAE.  He was seen by Dr. TRadford Paxin 2022 for follow-up and to establish care.  He was started on Toprol XL and given 3-day ZIO to evaluate heart rate control.  2D echo was also repeated showing EF of 65-70%, moderate AAS with plans to repeat in 6 months.  ZIO monitor showed adequate heart rate control.  Most recent 2D echo was completed 08/2021 showing EF of 60-65%, no RWMA, moderate AAS with mild LVH.  There was also noted a mildly dilated ascending aorta at 45 mm and aortic root at 41 mm.  Patient will require gated chest CTA for evaluation of increased aortic and presents today for TEE work-up.    Mr. BHoelpresents today to discuss TEE procedure per Dr. TRadford Pax  He presents today alone from his facility.  He denies angina, syncope, presyncope, shortness of breath.  We discussed and reviewed his recent echo results.  He is tolerating his current medication regimen and blood  pressure today was 122/80.  He is currently in rate controlled atrial fibrillation.  Patient denies chest pain, palpitations, dyspnea, PND, orthopnea, nausea, vomiting, dizziness, syncope, edema, weight gain, or early satiety.   Home Medications    Current Outpatient Medications  Medication Sig Dispense Refill   apixaban (ELIQUIS) 5 MG TABS tablet Take 5 mg by mouth 2 (two) times daily.     baclofen (LIORESAL) 10 MG tablet Take 10 mg by mouth at bedtime.     bisacodyl (DULCOLAX) 5 MG EC tablet Take 2 tablets (10 mg total) by mouth daily as needed for moderate constipation. 30 tablet 0   cephALEXin (KEFLEX) 250 MG capsule  Take by mouth at bedtime.     CRANBERRY PO Take by mouth. '425mg'$  daily     cyclobenzaprine (FLEXERIL) 10 MG tablet Take 1 tablet (10 mg total) by mouth 3 (three) times daily as needed for muscle spasms. 30 tablet 0   Dulaglutide (TRULICITY) 7.65 YY/5.0PT SOPN Inject into the skin every 7 (seven) days.     DULoxetine (CYMBALTA) 30 MG capsule TAKE 1 CAPSULE EVERY DAY 90 capsule 1   dutasteride (AVODART) 0.5 MG capsule Take 0.5 mg by mouth daily.     ergocalciferol (VITAMIN D2) 1.25 MG (50000 UT) capsule Take 50,000 Units by mouth every 14 (fourteen) days.     fluticasone (FLONASE) 50 MCG/ACT nasal spray Place 1 spray into both nostrils daily.     gabapentin (NEURONTIN) 100 MG capsule TAKE 1 CAPSULE(100 MG) BY MOUTH THREE TIMES DAILY 90 capsule 0   glimepiride (AMARYL) 2 MG tablet Take 3 mg by mouth at bedtime.      hydrocortisone (ANUSOL-HC) 25 MG suppository Place 1 suppository (25 mg total) rectally 2 (two) times daily. 12 suppository 0   ibuprofen (ADVIL) 800 MG tablet Take 800 mg by mouth every 8 (eight) hours as needed for mild pain or moderate pain.     levothyroxine (SYNTHROID, LEVOTHROID) 25 MCG tablet Take 2 tablets (50 mcg total) by mouth daily. 30 tablet 0   lisinopril (PRINIVIL,ZESTRIL) 10 MG tablet Take 10 mg by mouth every morning.     metFORMIN (GLUCOPHAGE-XR) 500 MG 24 hr tablet Take 500 mg by mouth 2 (two) times daily.      metoprolol succinate (TOPROL XL) 25 MG 24 hr tablet Take 1 tablet (25 mg total) by mouth daily. 90 tablet 3   morphine (MS CONTIN) 30 MG 12 hr tablet Take 1 tablet (30 mg total) by mouth 3 (three) times daily. 90 tablet 0   nystatin (MYCOSTATIN) 100000 UNIT/ML suspension   1   oxybutynin (DITROPAN) 5 MG tablet Take 5 mg by mouth 3 (three) times daily.     oxyCODONE-acetaminophen (PERCOCET) 7.5-325 MG tablet Take 1 tablet by mouth every 6 (six) hours as needed. 90 tablet 0   phenazopyridine (PYRIDIUM) 200 MG tablet Take 200 mg by mouth 3 (three) times daily as  needed for pain.     polyethylene glycol (MIRALAX / GLYCOLAX) packet Take 17 g by mouth 2 (two) times daily. 14 each 0   pramipexole (MIRAPEX) 0.125 MG tablet Take 1 tablet (0.125 mg total) by mouth at bedtime. 30 tablet 0   pseudoephedrine (SUDAFED) 30 MG tablet Take 30 mg by mouth every 6 (six) hours as needed for congestion.      senna-docusate (SENOKOT-S) 8.6-50 MG tablet Take 2 tablets by mouth 2 (two) times daily. 60 tablet 0   Tamsulosin HCl (FLOMAX) 0.4 MG CAPS Take 0.4 mg by mouth  daily.      testosterone enanthate (DELATESTRYL) 200 MG/ML injection Inject 100 mg into the muscle every 14 (fourteen) days. For IM use only     traZODone (DESYREL) 50 MG tablet Take 0.5 mg by mouth at bedtime.     No current facility-administered medications for this visit.     Review of Systems  Please see the history of present illness.    (+) Left knee pain  All other systems reviewed and are otherwise negative except as noted above.  Physical Exam    Wt Readings from Last 3 Encounters:  08/31/21 (!) 436 lb (197.8 kg)  12/13/20 (!) 436 lb 9.6 oz (198 kg)  09/05/16 (!) 432 lb (196 kg)   VS: Vitals:   08/31/21 1301  BP: 122/80  Pulse: 99  SpO2: 91%  ,Body mass index is 64.39 kg/m.  Constitutional:      Appearance: Morbidly obese not in distress.  Neck:     Vascular: JVD normal.  Pulmonary:     Effort: Pulmonary effort is normal.     Breath sounds: No wheezing. No rales. Diminished in the bases Cardiovascular:     Irregularly irregular normal S1. Normal S2.      Murmurs: There is no murmur.  Edema:    Peripheral edema absent.  Abdominal:     Palpations: Abdomen is soft non tender. There is no hepatomegaly.  Skin:    General: Skin is warm and dry.  Neurological:     General: No focal deficit present.     Mental Status: Alert and oriented to person, place and time.     Cranial Nerves: Cranial nerves are intact.  EKG/LABS/Other Studies Reviewed    ECG personally reviewed by me  today -atrial fibrillation with rate of 99 and nonspecific T wave changes in leads V3 and V4 similar to previous EKG  Risk Assessment/Calculations:    CHA2DS2-VASc Score = 3   This indicates a 3.2% annual risk of stroke. The patient's score is based upon: CHF History: 0 HTN History: 1 Diabetes History: 1 Stroke History: 0 Vascular Disease History: 0 Age Score: 1 Gender Score: 0     Lab Results  Component Value Date   WBC 8.1 12/13/2020   HGB 13.2 12/13/2020   HCT 40.2 12/13/2020   MCV 91 12/13/2020   PLT 292 12/13/2020   Lab Results  Component Value Date   CREATININE 0.84 12/13/2020   BUN 12 12/13/2020   NA 137 12/13/2020   K 4.5 12/13/2020   CL 101 12/13/2020   CO2 24 12/13/2020   No results found for: "ALT", "AST", "GGT", "ALKPHOS", "BILITOT" Lab Results  Component Value Date   CHOL 174 08/24/2016   HDL 32 (L) 08/24/2016   LDLCALC 120 (H) 08/24/2016   TRIG 110 08/24/2016   CHOLHDL 5.4 08/24/2016    Lab Results  Component Value Date   HGBA1C 5.8 (H) 08/24/2016    Assessment & Plan    1.  Aortic stenosis: -2D echo completed on 08/25/2021 moderate aortic stenosis.  Patient to undergo TEE for further evaluation -He is euvolemic on examination today -Patient denies angina, syncope, presyncope, shortness of breath today -Orders written and CBC, BMET to be drawn tomorrow by patient's facility.  2.  Permanent atrial fibrillation: -Patient is in rate controlled atrial fibrillation today  -He is tolerating his anticoagulation without any bleeding concerns -Continue Eliquis 5 mg twice daily -Continue Toprol-XL 25 mg CHA2DS2-VASc Score = 3 [CHF History: 0, HTN History: 1, Diabetes  History: 1, Stroke History: 0, Vascular Disease History: 0, Age Score: 1, Gender Score: 0].  Therefore, the patient's annual risk of stroke is 3.2 %.      3.  HTN: -Blood pressure today is well controlled at 122/80 -Continue Toprol-XL as noted above and lisinopril 10 mg -Patient was  unsure if still taking lisinopril and will contact office for confirmation when she returns home.  4.  OSA: -Patient reports compliance with CPAP nightly  5.  Morbid obesity: -BMI is 64.39 kg/m -Patient is encouraged to lose weight and increase physical activity as tolerated  Disposition: Follow-up with Fransico Him, MD or APP in 12  months Shared Decision Making/Informed Consent The risks [esophageal damage, perforation (1:10,000 risk), bleeding, pharyngeal hematoma as well as other potential complications associated with conscious sedation including aspiration, arrhythmia, respiratory failure and death], benefits (treatment guidance and diagnostic support) and alternatives of a transesophageal echocardiogram were discussed in detail with Mr. Kidane and he is willing to proceed.    Medication Adjustments/Labs and Tests Ordered: Current medicines are reviewed at length with the patient today.  Concerns regarding medicines are outlined above.   Signed, Mable Fill, Marissa Nestle, NP 08/31/2021, 1:40 PM [place]

## 2021-08-31 ENCOUNTER — Encounter: Payer: Self-pay | Admitting: Nurse Practitioner

## 2021-08-31 ENCOUNTER — Ambulatory Visit (INDEPENDENT_AMBULATORY_CARE_PROVIDER_SITE_OTHER): Payer: Medicare Other | Admitting: Nurse Practitioner

## 2021-08-31 VITALS — BP 122/80 | HR 99 | Ht 69.0 in | Wt >= 6400 oz

## 2021-08-31 DIAGNOSIS — G4733 Obstructive sleep apnea (adult) (pediatric): Secondary | ICD-10-CM | POA: Diagnosis not present

## 2021-08-31 DIAGNOSIS — I10 Essential (primary) hypertension: Secondary | ICD-10-CM

## 2021-08-31 DIAGNOSIS — I4821 Permanent atrial fibrillation: Secondary | ICD-10-CM

## 2021-08-31 DIAGNOSIS — I350 Nonrheumatic aortic (valve) stenosis: Secondary | ICD-10-CM

## 2021-08-31 NOTE — Patient Instructions (Addendum)
Medication Instructions:  Your physician recommends that you continue on your current medications as directed. Please refer to the Current Medication list given to you today. ** Verify if still taking lisinopril and notify office *If you need a refill on your cardiac medications before your next appointment, please call your pharmacy*  Lab Work: BMET and CBC to be drawn on 8/17 at facility If you have labs (blood work) drawn today and your tests are completely normal, you will receive your results only by: [place] (if you have MyChart) OR A paper copy in the mail If you have any lab test that is abnormal or we need to change your treatment, we will call you to review the results.  Testing/Procedures: Your physician has recommended that you have a TEE (Transesophageal ECHO)  Follow-Up: At [facility], you and your health needs are our priority.  As part of our continuing mission to provide you with exceptional heart care, we have created designated Provider Care Teams.  These Care Teams include your primary Cardiologist (physician) and Advanced Practice Providers (APPs -  Physician Assistants and Nurse Practitioners) who all work together to provide you with the care you need, when you need it.    Your next appointment:   3 week(s) PRN follow up for TEE 1 Year with Dr Radford Pax  The format for your next appointment:   In Person  Provider:   Fransico Him, MD {   Other Instructions You are scheduled for a TEE on 09/08/2021 with Dr. Harrington Challenger.  Please arrive at the [facility] (Main Entrance A) at [facility]: [place], [place] [address] at 09:00 am/pm. (1 hour prior to procedure)  DIET: Nothing to eat or drink after midnight except a sip of water with medications (see medication instructions below)  FYI: For your safety, and to allow [place] to monitor your vital signs accurately during the surgery/procedure we request that   if you have artificial nails, gel  coating, SNS etc. Please have those removed prior to your surgery/procedure. Not having the nail coverings /polish removed may result in cancellation or delay of your surgery/procedure.   Medication Instructions: Hold Diabetic medications- Metformin Continue your anticoagulant: Eliquis You will need to continue your anticoagulant after your procedure until you  are told by your  Provider that it is safe to stop   Labs: BMET and CBC to be drawn 8/17 at your facility   You must have a responsible person to drive you home and stay in the waiting area during your procedure. Failure to do so could result in cancellation.  Bring your insurance cards.  *Special Note: Every effort is made to have your procedure done on time. Occasionally there are emergencies that occur at the hospital that may cause delays. Please be patient if a delay does occur.

## 2021-09-02 ENCOUNTER — Encounter (HOSPITAL_COMMUNITY): Payer: Self-pay | Admitting: Internal Medicine

## 2021-09-02 NOTE — Progress Notes (Signed)
Attempted to obtain medical history via telephone, unable to reach at this time. HIPAA compliant voicemail message left requesting return call to pre surgical testing department. 

## 2021-09-08 ENCOUNTER — Ambulatory Visit (HOSPITAL_COMMUNITY)
Admission: RE | Admit: 2021-09-08 | Discharge: 2021-09-08 | Disposition: A | Discharge status: Home / Self Care | Payer: Medicare Other | Attending: Internal Medicine | Admitting: Internal Medicine

## 2021-09-08 ENCOUNTER — Encounter (HOSPITAL_COMMUNITY): Payer: Self-pay | Admitting: Internal Medicine

## 2021-09-08 ENCOUNTER — Ambulatory Visit (HOSPITAL_BASED_OUTPATIENT_CLINIC_OR_DEPARTMENT_OTHER): Payer: Medicare Other | Admitting: Anesthesiology

## 2021-09-08 ENCOUNTER — Other Ambulatory Visit: Payer: Self-pay

## 2021-09-08 ENCOUNTER — Encounter (HOSPITAL_COMMUNITY)
Admission: RE | Disposition: A | Discharge status: Home / Self Care | Payer: Self-pay | Source: Home / Self Care | Attending: Internal Medicine

## 2021-09-08 ENCOUNTER — Ambulatory Visit (HOSPITAL_BASED_OUTPATIENT_CLINIC_OR_DEPARTMENT_OTHER)
Admission: RE | Admit: 2021-09-08 | Discharge: 2021-09-08 | Disposition: A | Discharge status: Home / Self Care | Payer: Medicare Other | Source: Home / Self Care | Attending: Internal Medicine | Admitting: Internal Medicine

## 2021-09-08 ENCOUNTER — Ambulatory Visit (HOSPITAL_COMMUNITY): Payer: Medicare Other | Admitting: Anesthesiology

## 2021-09-08 DIAGNOSIS — N400 Enlarged prostate without lower urinary tract symptoms: Secondary | ICD-10-CM | POA: Diagnosis not present

## 2021-09-08 DIAGNOSIS — Z7901 Long term (current) use of anticoagulants: Secondary | ICD-10-CM | POA: Insufficient documentation

## 2021-09-08 DIAGNOSIS — Z85038 Personal history of other malignant neoplasm of large intestine: Secondary | ICD-10-CM | POA: Insufficient documentation

## 2021-09-08 DIAGNOSIS — E039 Hypothyroidism, unspecified: Secondary | ICD-10-CM | POA: Diagnosis not present

## 2021-09-08 DIAGNOSIS — I4891 Unspecified atrial fibrillation: Secondary | ICD-10-CM | POA: Diagnosis not present

## 2021-09-08 DIAGNOSIS — I4821 Permanent atrial fibrillation: Secondary | ICD-10-CM | POA: Insufficient documentation

## 2021-09-08 DIAGNOSIS — E119 Type 2 diabetes mellitus without complications: Secondary | ICD-10-CM | POA: Diagnosis not present

## 2021-09-08 DIAGNOSIS — I351 Nonrheumatic aortic (valve) insufficiency: Secondary | ICD-10-CM

## 2021-09-08 DIAGNOSIS — Z79899 Other long term (current) drug therapy: Secondary | ICD-10-CM | POA: Diagnosis not present

## 2021-09-08 DIAGNOSIS — I350 Nonrheumatic aortic (valve) stenosis: Secondary | ICD-10-CM

## 2021-09-08 DIAGNOSIS — I10 Essential (primary) hypertension: Secondary | ICD-10-CM | POA: Insufficient documentation

## 2021-09-08 DIAGNOSIS — G4733 Obstructive sleep apnea (adult) (pediatric): Secondary | ICD-10-CM | POA: Diagnosis not present

## 2021-09-08 DIAGNOSIS — Z6841 Body Mass Index (BMI) 40.0 and over, adult: Secondary | ICD-10-CM | POA: Diagnosis not present

## 2021-09-08 HISTORY — PX: TEE WITHOUT CARDIOVERSION: SHX5443

## 2021-09-08 LAB — ECHO TEE
AV Mean grad: 14.5 mmHg
AV Peak grad: 22 mmHg
Ao pk vel: 2.35 m/s

## 2021-09-08 SURGERY — ECHOCARDIOGRAM, TRANSESOPHAGEAL
Anesthesia: General

## 2021-09-08 MED ORDER — SUGAMMADEX SODIUM 200 MG/2ML IV SOLN
INTRAVENOUS | Status: DC | PRN
  Administered 2021-09-08: 400 mg via INTRAVENOUS

## 2021-09-08 MED ORDER — PHENYLEPHRINE HCL-NACL 20-0.9 MG/250ML-% IV SOLN
INTRAVENOUS | Status: DC | PRN
  Administered 2021-09-08: 40 ug/min via INTRAVENOUS

## 2021-09-08 MED ORDER — ACETAMINOPHEN 500 MG PO TABS: 1000.0000 mg | ORAL_TABLET | Freq: Once | ORAL | Status: DC

## 2021-09-08 MED ORDER — PROPOFOL 10 MG/ML IV BOLUS
INTRAVENOUS | Status: DC | PRN
  Administered 2021-09-08: 150 mg via INTRAVENOUS

## 2021-09-08 MED ORDER — ONDANSETRON HCL 4 MG/2ML IJ SOLN
INTRAMUSCULAR | Status: DC | PRN
  Administered 2021-09-08: 4 mg via INTRAVENOUS

## 2021-09-08 MED ORDER — SUCCINYLCHOLINE CHLORIDE 200 MG/10ML IV SOSY
PREFILLED_SYRINGE | INTRAVENOUS | Status: DC | PRN
  Administered 2021-09-08: 120 mg via INTRAVENOUS

## 2021-09-08 MED ORDER — SODIUM CHLORIDE 0.9 % IV SOLN: INTRAVENOUS | Status: DC

## 2021-09-08 MED ORDER — PHENYLEPHRINE 80 MCG/ML (10ML) SYRINGE FOR IV PUSH (FOR BLOOD PRESSURE SUPPORT)
PREFILLED_SYRINGE | INTRAVENOUS | Status: DC | PRN
  Administered 2021-09-08: 80 ug via INTRAVENOUS
  Administered 2021-09-08 (×2): 160 ug via INTRAVENOUS
  Administered 2021-09-08: 240 ug via INTRAVENOUS

## 2021-09-08 MED ORDER — ACETAMINOPHEN 500 MG PO TABS
ORAL_TABLET | ORAL | Status: AC
  Filled 2021-09-08: qty 2

## 2021-09-08 MED ORDER — LIDOCAINE 2% (20 MG/ML) 5 ML SYRINGE
INTRAMUSCULAR | Status: DC | PRN
  Administered 2021-09-08: 40 mg via INTRAVENOUS

## 2021-09-08 MED ORDER — ROCURONIUM BROMIDE 10 MG/ML (PF) SYRINGE
PREFILLED_SYRINGE | INTRAVENOUS | Status: DC | PRN
  Administered 2021-09-08: 30 mg via INTRAVENOUS

## 2021-09-08 MED ORDER — SODIUM CHLORIDE 0.9 % IV SOLN
INTRAVENOUS | Status: AC | PRN
  Administered 2021-09-08: 500 mL via INTRAVENOUS

## 2021-09-08 MED ORDER — LIDOCAINE VISCOUS HCL 2 % MT SOLN
OROMUCOSAL | Status: AC
  Filled 2021-09-08: qty 15

## 2021-09-08 MED ORDER — MENTHOL 3 MG MT LOZG
1.0000 | LOZENGE | OROMUCOSAL | Status: DC | PRN
  Administered 2021-09-08: 3 mg via ORAL
  Filled 2021-09-08: qty 9

## 2021-09-08 NOTE — Anesthesia Procedure Notes (Signed)
Procedure Name: Intubation Date/Time: 09/08/2021 10:02 AM  Performed by: Jenne Campus, CRNAPre-anesthesia Checklist: Patient identified, Emergency Drugs available, Suction available and Patient being monitored Patient Re-evaluated:Patient Re-evaluated prior to induction Oxygen Delivery Method: Circle System Utilized Preoxygenation: Pre-oxygenation with 100% oxygen Induction Type: IV induction Ventilation: Mask ventilation without difficulty Laryngoscope Size: Glidescope and 4 Grade View: Grade I Tube type: Oral Tube size: 8.0 mm Number of attempts: 1 Airway Equipment and Method: Oral airway and Rigid stylet Placement Confirmation: ETT inserted through vocal cords under direct vision, positive ETCO2 and breath sounds checked- equal and bilateral Secured at: 22 cm Tube secured with: Tape Dental Injury: Teeth and Oropharynx as per pre-operative assessment

## 2021-09-08 NOTE — Transfer of Care (Signed)
Immediate Anesthesia Transfer of Care Note  Patient: Brian Richmond  Procedure(s) Performed: TRANSESOPHAGEAL ECHOCARDIOGRAM (TEE)  Patient Location: Endoscopy Unit  Anesthesia Type:General  Level of Consciousness: awake, oriented and patient cooperative  Airway & Oxygen Therapy: Patient Spontanous Breathing and Patient connected to nasal cannula oxygen  Post-op Assessment: Report given to RN and Post -op Vital signs reviewed and stable  Post vital signs: Reviewed  Last Vitals:  Vitals Value Taken Time  BP    Temp    Pulse 92 09/08/21 1106  Resp 13 09/08/21 1106  SpO2 95 % 09/08/21 1106  Vitals shown include unvalidated device data.  Last Pain:  Vitals:   09/08/21 0924  PainSc: 0-No pain         Complications: No notable events documented.

## 2021-09-08 NOTE — Progress Notes (Signed)
  Echocardiogram Echocardiogram Transesophageal has been performed.  Brian Richmond 09/08/2021, 11:00 AM

## 2021-09-08 NOTE — Progress Notes (Signed)
Pt presented to endoscopy today for TEE with MD Harrington Challenger. Post-op, pt initially c/o 6/10 throat pain. MD Harrington Challenger was notified and assessed the pt at the bedside. Pt then stated his pain had worsened to 8/10. MD Harrington Challenger was notified of the change and assessed the pt a second time. MD Harrington Challenger gave a VO for 1000 mg acetaminophen PO and a cepacol lozenge. The pt refused the acetaminophen stating that he is unable to take pills. MD Harrington Challenger notified of pt's refusal.  Debarah Crape, RN 09/08/21 11:50 AM

## 2021-09-08 NOTE — Anesthesia Postprocedure Evaluation (Signed)
Anesthesia Post Note  Patient: Brian Richmond  Procedure(s) Performed: TRANSESOPHAGEAL ECHOCARDIOGRAM (TEE)     Patient location during evaluation: PACU Anesthesia Type: General Level of consciousness: awake and alert Pain management: pain level controlled Vital Signs Assessment: post-procedure vital signs reviewed and stable Respiratory status: spontaneous breathing, nonlabored ventilation and respiratory function stable Cardiovascular status: blood pressure returned to baseline Postop Assessment: no apparent nausea or vomiting Anesthetic complications: no   No notable events documented.  Last Vitals:  Vitals:   09/08/21 1135 09/08/21 1145  BP: (!) 88/64 98/62  Pulse: 97 85  Resp: 14 11  Temp:    SpO2: 96% 94%    Last Pain:  Vitals:   09/08/21 1145  TempSrc:   PainSc: [place]

## 2021-09-08 NOTE — Interval H&P Note (Signed)
History and Physical Interval Note:  09/08/2021 9:27 AM  Brian Richmond  has presented today for surgery, with the diagnosis of AORTIC STENOSIS.  The various methods of treatment have been discussed with the patient and family. After consideration of risks, benefits and other options for treatment, the patient has consented to  Procedure(s): TRANSESOPHAGEAL ECHOCARDIOGRAM (TEE) (N/A) as a surgical intervention.  The patient's history has been reviewed, patient examined, no change in status, stable for surgery.  I have reviewed the patient's chart and labs.  Questions were answered to the patient's satisfaction.     Dorris Carnes

## 2021-09-08 NOTE — Discharge Instructions (Signed)

## 2021-09-08 NOTE — CV Procedure (Signed)
Transesophageal Echo  Indication:   Evaluate aortic stenosis  Patient underwent GET by anesthesia Bite guard in place TEE probe advanced with direct camera assistance  Full report to follow in CV section of chart  Pt extubated after procedure finished.    Procedure was without complications  Dorris Carnes MD

## 2021-09-08 NOTE — Anesthesia Preprocedure Evaluation (Addendum)
Anesthesia Evaluation  Patient identified by MRN, date of birth, ID band Patient awake    Reviewed: Allergy & Precautions, NPO status , Patient's Chart, lab work & pertinent test results, reviewed documented beta blocker date and time   History of Anesthesia Complications Negative for: history of anesthetic complications  Airway Mallampati: III  TM Distance: >3 FB Neck ROM: Full    Dental  (+) Edentulous Upper, Edentulous Lower, Dental Advisory Given   Pulmonary sleep apnea ,    Pulmonary exam normal        Cardiovascular hypertension, Pt. on medications and Pt. on home beta blockers + dysrhythmias Atrial Fibrillation  Rhythm:Irregular  TTE 08/25/21: EF 60-65%, mild LVH, moderate AS (VTI 1.23 cm, mean gradient 23.5 mmHg, Vmax 3.21 m/s, 41 mmHg peak gradient, DI is 0.21), mild dilatation of the aortic root, measuring 41 mm    Neuro/Psych Depression    GI/Hepatic negative GI ROS, Neg liver ROS,   Endo/Other  diabetes, Type 2Hypothyroidism Morbid obesity  Renal/GU negative Renal ROS  negative genitourinary   Musculoskeletal  (+) Arthritis ,   Abdominal   Peds  Hematology negative hematology ROS (+)   Anesthesia Other Findings Day of surgery medications reviewed with patient.  Reproductive/Obstetrics negative OB ROS                           Anesthesia Physical Anesthesia Plan  ASA: 4  Anesthesia Plan: General   Post-op Pain Management: Minimal or no pain anticipated   Induction: Intravenous and Rapid sequence  PONV Risk Score and Plan: 1 and Treatment may vary due to age or medical condition and Ondansetron  Airway Management Planned: Oral ETT and Video Laryngoscope Planned  Additional Equipment: None  Intra-op Plan:   Post-operative Plan: Extubation in OR  Informed Consent: I have reviewed the patients History and Physical, chart, labs and discussed the procedure including the  risks, benefits and alternatives for the proposed anesthesia with the patient or authorized representative who has indicated his/her understanding and acceptance.     Dental advisory given  Plan Discussed with: CRNA  Anesthesia Plan Comments:       Anesthesia Quick Evaluation

## 2021-10-12 DIAGNOSIS — E119 Type 2 diabetes mellitus without complications: Secondary | ICD-10-CM | POA: Insufficient documentation

## 2021-10-12 DIAGNOSIS — N471 Phimosis: Secondary | ICD-10-CM | POA: Insufficient documentation

## 2021-11-07 ENCOUNTER — Telehealth: Payer: Self-pay | Admitting: Cardiology

## 2021-11-07 NOTE — Telephone Encounter (Signed)
Spoke with pt and advised of TEE results as below per Dr Radford Pax.  Pt verbalizes understanding and agrees with current plan.  Per Dr Radford Pax: Normal LVF with mild LA enlargement, bicuspid AV with moderate AS and mild AR - repeat 2D echo in 1 year for AS

## 2021-11-07 NOTE — Telephone Encounter (Signed)
Patient is calling for the results to his TEE he had done back in August.

## 2022-11-17 ENCOUNTER — Other Ambulatory Visit (HOSPITAL_COMMUNITY): Payer: Self-pay | Admitting: Internal Medicine

## 2022-11-17 DIAGNOSIS — R109 Unspecified abdominal pain: Secondary | ICD-10-CM

## 2022-12-05 ENCOUNTER — Ambulatory Visit (HOSPITAL_COMMUNITY)
Admission: RE | Admit: 2022-12-05 | Discharge: 2022-12-05 | Disposition: A | Discharge status: Home / Self Care | Payer: Medicare Other | Source: Ambulatory Visit | Attending: Internal Medicine | Admitting: Internal Medicine

## 2022-12-05 DIAGNOSIS — R109 Unspecified abdominal pain: Secondary | ICD-10-CM | POA: Insufficient documentation

## 2022-12-05 LAB — POCT I-STAT CREATININE: Creatinine, Ser: 0.8 mg/dL (ref 0.61–1.24)

## 2022-12-05 MED ORDER — IOHEXOL 350 MG/ML SOLN
75.0000 mL | Freq: Once | INTRAVENOUS | Status: AC | PRN
  Administered 2022-12-05: 75 mL via INTRAVENOUS

## 2023-02-20 ENCOUNTER — Other Ambulatory Visit: Payer: Self-pay | Admitting: Internal Medicine

## 2023-02-20 ENCOUNTER — Telehealth: Payer: Self-pay | Admitting: *Deleted

## 2023-02-20 NOTE — Telephone Encounter (Signed)
   Pre-operative Risk Assessment    Patient Name: Brian Richmond  DOB: [birth year] MRN: [id number]   Date of last office visit: 08/31/21 JACKEE ALBERTS, NP Date of next office visit: NONE   Request for Surgical Clearance    Procedure:  COLONOSCOPY  Date of Surgery:  Clearance 03/13/23                                Surgeon:  DR. KRISS Surgeon's Group or Practice Name:  [facility] Phone number:  [phone] Fax number:  [phone]   Type of Clearance Requested:   - Medical  - Pharmacy:  Hold Apixaban (Eliquis) x 2 DAYS PRIOR   Type of Anesthesia:   PROPOFOL    Additional requests/questions:    Bonney Niels Jest   02/20/2023, 6:01 PM

## 2023-02-21 NOTE — Telephone Encounter (Signed)
 Pt scheduled in office appt for preop clearance per preop APP. Appt with Charles Connor, NP 03/07/23 @ 1:55.   I will update all parties involved.

## 2023-02-21 NOTE — Telephone Encounter (Signed)
   Name: Brian Richmond  DOB: 1955-08-25  MRN: [id number]  Primary Cardiologist: Wilbert Bihari, MD  Chart reviewed as part of pre-operative protocol coverage. Because of Brian Richmond past medical history and time since last visit, he will require a follow-up in-office visit in order to better assess preoperative cardiovascular risk.  Pre-op covering staff: - Please schedule appointment and call patient to inform them. If patient already had an upcoming appointment within acceptable timeframe, please add pre-op clearance to the appointment notes so provider is aware. - Please contact requesting surgeon's office via preferred method (i.e, phone, fax) to inform them of need for appointment prior to surgery.  This message will also be routed to pharmacy pool  for input on holding Eliquis as requested below so that this information is available to the clearing provider at time of patient's appointment.   Wyn Raddle, Jackee Shove, NP  02/21/2023, 7:26 AM

## 2023-02-21 NOTE — Telephone Encounter (Signed)
 Patient with diagnosis of A Fib on Eliquis for anticoagulation. Last office visit 08/28/21.  Procedure: colonoscopy Date of procedure: 03/13/23   CHA2DS2-VASc Score = 4  This indicates a 4.8% annual risk of stroke. The patient's score is based upon: CHF History: 0 HTN History: 1 Diabetes History: 1 Stroke History: 0 Vascular Disease History: 1 Age Score: 1 Gender Score: 0    CrCl 150 ml/min using adjusted budy weight Platelet count 199K  Per office protocol, patient can hold Eliquis for 2 days prior to procedure.    **This guidance is not considered finalized until pre-operative APP has relayed final recommendations.**

## 2023-03-06 NOTE — Progress Notes (Unsigned)
 Cardiology Office Note    Patient Name: Brian Richmond Date of Encounter: 03/06/2023  Primary Care Provider:  Catha Gosselin, MD Primary Cardiologist:  Armanda Magic, MD Primary Electrophysiologist: None   Past Medical History    Past Medical History:  Diagnosis Date   Aortic stenosis    moderate by echo 02/2021   Carpal tunnel syndrome    bilateral, and neuropathy bilateral feet   Colon cancer (HCC) 03/2008   surgery only   Diabetes mellitus without complication (HCC)    Hx of seasonal allergies    Hypertension    Neuropathy    Obesity    BMI 68.0 kg   OSA (obstructive sleep apnea)    uses cpap nightly   Reduced mobility    06-27-13 states can transfer self.limited on distances-uses walker and wheelchair mostly.    History of Present Illness  Brian Richmond is a 68 y.o. male with PMH of chronic AF, HTN, DM, moderate AS, hypothyroidism, OSA on CPAP, obesity, colon cancer (s/p partial colon resection), BPH who is seen today for preoperative clearance.  Brian Richmond was last seen on 08/31/2021 for follow-up of aortic stenosis.  He underwent a TTE on 08/25/2021 that was concerning for worsening aortic stenosis.  He underwent a TEE for further evaluation that showed EF of 55 to 60% with mild AVR and by cussed valve with fusion and mean gradient of 21 mm and 14 mm.   During today's visit the patient reports*** .  Patient denies chest pain, palpitations, dyspnea, PND, orthopnea, nausea, vomiting, dizziness, syncope, edema, weight gain, or early satiety.  ***Notes: Patient scheduled for colonoscopy and can hold Eliquis 2 days prior to procedure -Last ischemic evaluation: -Last echo: -Interim ED visits: Review of Systems  Please see the history of present illness.    All other systems reviewed and are otherwise negative except as noted above.  Physical Exam    Wt Readings from Last 3 Encounters:  09/08/21 (!) 436 lb 1.1 oz (197.8 kg)  08/31/21 (!) 436 lb (197.8 kg)  12/13/20 (!)  436 lb 9.6 oz (198 kg)   WU:JWJXB were no vitals filed for this visit.,There is no height or weight on file to calculate BMI. GEN: Well nourished, well developed in no acute distress Neck: No JVD; No carotid bruits Pulmonary: Clear to auscultation without rales, wheezing or rhonchi  Cardiovascular: Normal rate. Regular rhythm. Normal S1. Normal S2.   Murmurs: There is no murmur.  ABDOMEN: Soft, non-tender, non-distended EXTREMITIES:  No edema; No deformity   EKG/LABS/ Recent Cardiac Studies   ECG personally reviewed by me today - ***  Risk Assessment/Calculations:   {Does this patient have ATRIAL FIBRILLATION?[phone]}      Lab Results  Component Value Date   WBC 8.1 12/13/2020   HGB 13.2 12/13/2020   HCT 40.2 12/13/2020   MCV 91 12/13/2020   PLT 292 12/13/2020   Lab Results  Component Value Date   CREATININE 0.80 12/05/2022   BUN 12 12/13/2020   NA 137 12/13/2020   K 4.5 12/13/2020   CL 101 12/13/2020   CO2 24 12/13/2020   Lab Results  Component Value Date   CHOL 174 08/24/2016   HDL 32 (L) 08/24/2016   LDLCALC 120 (H) 08/24/2016   TRIG 110 08/24/2016   CHOLHDL 5.4 08/24/2016    Lab Results  Component Value Date   HGBA1C 5.8 (H) 08/24/2016   Assessment & Plan    1.  Preoperative clearance: -Patient's RCRI score is  0.4%  2.Permanent atrial fibrillation: -Patient is in   3.  Primary hypertension  4.  History of OSA  5.  .Permanent atrial fibrillation: -Patient is in       Disposition: Follow-up with Armanda Magic, MD or APP in *** months {Are you ordering a CV Procedure (e.g. stress test, cath, DCCV, TEE, etc)?   Press F2        :[id number]}   Signed, Napoleon Form, Leodis Rains, NP 03/06/2023, 1:33 PM  [facility]

## 2023-03-07 ENCOUNTER — Encounter (HOSPITAL_COMMUNITY): Payer: Self-pay | Admitting: Internal Medicine

## 2023-03-07 ENCOUNTER — Ambulatory Visit (INDEPENDENT_AMBULATORY_CARE_PROVIDER_SITE_OTHER): Payer: Medicare Other | Admitting: Nurse Practitioner

## 2023-03-07 ENCOUNTER — Encounter: Payer: Self-pay | Admitting: Physician Assistant

## 2023-03-07 ENCOUNTER — Ambulatory Visit: Payer: Medicare Other | Attending: Physician Assistant | Admitting: Physician Assistant

## 2023-03-07 VITALS — BP 119/79 | HR 110 | Ht 69.0 in | Wt >= 6400 oz

## 2023-03-07 DIAGNOSIS — Z6841 Body Mass Index (BMI) 40.0 and over, adult: Secondary | ICD-10-CM | POA: Insufficient documentation

## 2023-03-07 DIAGNOSIS — I350 Nonrheumatic aortic (valve) stenosis: Secondary | ICD-10-CM

## 2023-03-07 DIAGNOSIS — Z01810 Encounter for preprocedural cardiovascular examination: Secondary | ICD-10-CM

## 2023-03-07 DIAGNOSIS — I10 Essential (primary) hypertension: Secondary | ICD-10-CM

## 2023-03-07 DIAGNOSIS — G4733 Obstructive sleep apnea (adult) (pediatric): Secondary | ICD-10-CM | POA: Diagnosis present

## 2023-03-07 DIAGNOSIS — I4821 Permanent atrial fibrillation: Secondary | ICD-10-CM

## 2023-03-07 NOTE — Progress Notes (Signed)
 Cardiology Office Note:  .   Date:  03/07/2023  ID:  Brian Richmond, Brian Richmond Dec 07, 1955, MRN [id number] PCP: [facility], [facility]  [facility] Providers Cardiologist:  Armanda Magic, MD    History of Present Illness: .   Brian Richmond is a 68 y.o. male with PMH of permanent atrial fibrillation, hypertension, DM2, moderate aortic stenosis, hypothyroidism, OSA on CPAP, morbid obesity, colon cancer s/p partial colon resection and BPH.  He was seen in 2018 for new onset of atrial fibrillation.  He presented to the ED at the time with generalized weakness and the rectal bleeding.  On arrival, he was noted to be in A-fib with RVR with heart rate in the 140s to 150s.  Cardizem drip and oral Lopressor were started.  He had episodes of bradycardia.  D-dimer was positive.  He was initially on warfarin and later changed to Xarelto then eventually Eliquis.  Echocardiogram in 2018 showed EF 60 to 65%, mild aortic stenosis, moderate LAE.  He was switched to metoprolol succinate.  3-day heart monitor demonstrated adequate rate control.  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  Patient presents today for preoperative clearance prior to colonoscopy procedure.  Patient currently lives lives in [facility].  He denies any chest pain or shortness of breath.  He gets around using an electric wheelchair, he rarely tried to stand up using a walker.  He is essentially wheelchair-bound at this point.  He does not have any sign of lower extremity edema on exam.  He has no orthopnea or PND.  Colonoscopy procedure is a low risk procedure, patient is  at acceptable risk to proceed from the cardiac perspective after holding Eliquis for 2 days prior to the procedure and restart as soon as possible afterward at the GI physician's discretion.  He does need a repeat echocardiogram to follow-up on aortic stenosis.  He can follow-up with Dr. Mayford Knife in 6 months.  ROS:   He denies chest pain, palpitations, dyspnea, pnd, orthopnea, n, v, dizziness, syncope, edema, weight gain, or early satiety. All other systems reviewed and are otherwise negative except as noted above.    Studies Reviewed: Marland Kitchen   EKG Interpretation Date/Time:  Wednesday March 07 2023 09:30:22 EST Ventricular Rate:  98 PR Interval:    QRS Duration:  82 QT Interval:  336 QTC Calculation: 428 R Axis:   -15  Text Interpretation: Atrial fibrillation  Rate controlled Confirmed by Azalee Course [phone]) on 03/07/2023 2:10:03 PM    Cardiac Studies & Procedures   ______________________________________________________________________________________________     ECHOCARDIOGRAM  ECHOCARDIOGRAM COMPLETE 08/25/2021  Narrative ECHOCARDIOGRAM REPORT    Patient Name:   Brian Richmond Date of Exam: 08/25/2021 Medical Rec #:  [id number]      Height:       69.0 in Accession #:    [id number]     Weight:       436.6 lb Date of Birth:  12-20-1955      BSA:          2.879 m Patient Age:    65 years       BP:  112/64 mmHg Patient Gender: M              HR:           97 bpm. Exam Location:  [address]  Procedure: 2D Echo, Cardiac Doppler, Color Doppler and Intracardiac Opacification Agent  Indications:    I35 Aortic stenosis  History:        Patient has prior history of Echocardiogram examinations, most recent 02/24/2021. Aortic Valve Disease, Arrythmias:Atrial Fibrillation; Risk Factors:Hypertension, Diabetes, Sleep Apnea and Morbid obesity.  Sonographer:    Jorje Guild BS, RDCS Referring Phys: [phone] TRACI R TURNER   Sonographer Comments: Suboptimal parasternal window,  suboptimal apical window, Technically difficult study due to poor echo windows and suboptimal subcostal window. Image acquisition challenging due to patient body habitus. IMPRESSIONS   1. Left ventricular ejection fraction, by estimation, is 60 to 65%. The left ventricle has normal function. The left ventricle has no regional wall motion abnormalities. There is mild left ventricular hypertrophy. Left ventricular diastolic function could not be evaluated. 2. Right ventricular systolic function was not well visualized. The right ventricular size is not well visualized. 3. The mitral valve is abnormal. Trivial mitral valve regurgitation. 4. The aortic valve was not well visualized. There is moderate calcification of the aortic valve. Aortic valve regurgitation is not visualized. Moderate aortic valve stenosis. Aortic valve area, by VTI measures 1.23 cm. Aortic valve mean gradient measures 23.5 mmHg. Aortic valve Vmax measures 3.21 m/s. 41 mmHg peak gradient, DI is 0.21. 5. Aortic dilatation noted. There is mild dilatation of the aortic root, measuring 41 mm. There is mild dilatation of the ascending aorta, measuring 44 mm.  Comparison(s): Changes from prior study are noted. 02/24/21: LVEF 65-70%. Moderate AS mean PG, peak PG, aortic root measured 38 mm.  FINDINGS Left Ventricle: Left ventricular ejection fraction, by estimation, is 60 to 65%. The left ventricle has normal function. The left ventricle has no regional wall motion abnormalities. Definity contrast agent was given IV to delineate the left ventricular endocardial borders. The left ventricular internal cavity size was normal in size. There is mild left ventricular hypertrophy. Left ventricular diastolic function could not be evaluated due to atrial fibrillation. Left ventricular diastolic function could not be evaluated.  Right Ventricle: The right ventricular size is not well visualized. Right vetricular wall thickness was not  well visualized. Right ventricular systolic function was not well visualized.  Left Atrium: Left atrial size was normal in size.  Right Atrium: Right atrial size was normal in size.  Pericardium: The pericardium was not well visualized.  Mitral Valve: The mitral valve is abnormal. Mild to moderate mitral annular calcification. Trivial mitral valve regurgitation.  Tricuspid Valve: The tricuspid valve is not well visualized. Tricuspid valve regurgitation is not demonstrated.  Aortic Valve: The aortic valve was not well visualized. There is moderate calcification of the aortic valve. Aortic valve regurgitation is not visualized. Moderate aortic stenosis is present. Aortic valve mean gradient measures 23.5 mmHg. Aortic valve peak gradient measures 41.2 mmHg. Aortic valve area, by VTI measures 1.23 cm.  Pulmonic Valve: The pulmonic valve was not well visualized. Pulmonic valve regurgitation is trivial.  Aorta: Aortic dilatation noted. There is mild dilatation of the aortic root, measuring 41 mm. There is mild dilatation of the ascending aorta, measuring 44 mm.  IAS/Shunts: No atrial level shunt detected by color flow Doppler.   LEFT VENTRICLE PLAX 2D LVIDd:         5.00 cm LVIDs:  3.90 cm LV PW:         1.10 cm LV IVS:        1.20 cm LVOT diam:     2.70 cm LV SV:         81 LV SV Index:   28 LVOT Area:     5.73 cm   LEFT ATRIUM             Index        RIGHT ATRIUM LA diam:        3.90 cm 1.35 cm/m   RA Pressure: 3.00 mmHg LA Vol (A2C):   91.1 ml 31.64 ml/m LA Vol (A4C):   46.7 ml 16.22 ml/m LA Biplane Vol: 71.7 ml 24.91 ml/m AORTIC VALVE AV Area (Vmax):    1.37 cm AV Area (Vmean):   1.51 cm AV Area (VTI):     1.23 cm AV Vmax:           321.00 cm/s AV Vmean:          199.200 cm/s AV VTI:            0.662 m AV Peak Grad:      41.2 mmHg AV Mean Grad:      23.5 mmHg LVOT Vmax:         76.83 cm/s LVOT Vmean:        52.367 cm/s LVOT VTI:          0.142  m LVOT/AV VTI ratio: 0.21  AORTA Ao Root diam: 4.10 cm Ao Asc diam:  4.40 cm  TRICUSPID VALVE Estimated RAP:  3.00 mmHg  SHUNTS Systemic VTI:  0.14 m Systemic Diam: 2.70 cm  Zoila Shutter MD Electronically signed by Zoila Shutter MD Signature Date/Time: 08/25/2021/1:50:07 PM    Final   TEE  ECHO TEE 09/08/2021  Narrative TRANSESOPHOGEAL ECHO REPORT    Patient Name:   DASON MOSLEY Date of Exam: 09/08/2021 Medical Rec #:  [id number]      Height:       69.0 in Accession #:    [id number]     Weight:       436.1 lb Date of Birth:  10-05-1955      BSA:          2.877 m Patient Age:    66 years       BP:           128/77 mmHg Patient Gender: M              HR:           127 bpm. Exam Location:  Inpatient  Procedure: Color Doppler, Transesophageal Echo, Cardiac Doppler and 3D Echo  Indications:     Aortic stenosis  History:         Patient has prior history of Echocardiogram examinations, most recent 08/25/2021. Arrythmias:Atrial Fibrillation; Risk Factors:Sleep Apnea, Diabetes and Hypertension.  Sonographer:     Ross Ludwig RDCS (AE) Referring Phys:  Sherol Dade ROSS Diagnosing Phys: Dietrich Pates MD  PROCEDURE: After discussion of the risks and benefits of a TEE, an informed consent was obtained from the patient. The transesophogeal probe was passed without difficulty through the esophogus of the patient. Sedation performed by different physician. The patient was monitored while under deep sedation. Anesthestetic sedation was provided intravenously by Anesthesiology: 150mg  of Propofol, 40mg  of Lidocaine. Image quality was good. The patient developed no complications during the procedure.  IMPRESSIONS   1. Left ventricular ejection fraction,  by estimation, is 55 to 60%. The left ventricle has normal function. The left ventricle has no regional wall motion abnormalities. 2. Right ventricular systolic function is normal. The right ventricular size is normal. 3. Left atrial size  was dilated. No left atrial/left atrial appendage thrombus was detected. 4. No obvious intraatrial shunt by color doppler. 5. The mitral valve is normal in structure. Trivial mitral valve regurgitation. 6. AV is thickened, calcified with restricted motion. Valve appears functionally bicuspid with fusion of the left and right coronary cusps. The noncoronary cusp is really the only mobile cusp. Peak and mean gradients through the valve are 21 and 14 mm HG respectively (obtained from gastric view) The AV planimeters to 1.11 cm2. . Aortic valve regurgitation is mild.  FINDINGS Left Ventricle: Left ventricular ejection fraction, by estimation, is 55 to 60%. The left ventricle has normal function. The left ventricle has no regional wall motion abnormalities. The left ventricular internal cavity size was normal in size.  Right Ventricle: The right ventricular size is normal. Right vetricular wall thickness was not assessed. Right ventricular systolic function is normal.  Left Atrium: Left atrial size was dilated. No left atrial/left atrial appendage thrombus was detected.  Pericardium: Trivial pericardial effusion is present.  Mitral Valve: The mitral valve is normal in structure. Trivial mitral valve regurgitation.  Tricuspid Valve: The tricuspid valve is normal in structure. Tricuspid valve regurgitation is mild.  Aortic Valve: AV is thickened, calcified with restricted motion. Valve appears functionally bicuspid with fusion of the left and right coronary cusps. The noncoronary cusp is really the only mobile cusp. Peak and mean gradients through the valve are 21 and 14 mm HG respectively (obtained from gastric view) The AV planimeters to 1.11 cm2. Aortic valve regurgitation is mild. Aortic valve mean gradient measures 14.5 mmHg. Aortic valve peak gradient measures 22.0 mmHg.  Pulmonic Valve: The pulmonic valve was normal in structure. Pulmonic valve regurgitation is trivial.  Aorta: The aortic  root is normal in size and structure. There is minimal (Grade I) plaque.  IAS/Shunts: No obvious intraatrial shunt by color doppler.   LEFT VENTRICLE PLAX 2D LVOT diam:     2.20 cm LVOT Area:     3.80 cm   AORTIC VALVE AV Vmax:      234.50 cm/s AV Vmean:     183.500 cm/s AV VTI:       0.544 m AV Peak Grad: 22.0 mmHg AV Mean Grad: 14.5 mmHg  AORTA Ao Root diam: 3.80 cm   SHUNTS Systemic Diam: 2.20 cm  Dietrich Pates MD Electronically signed by Dietrich Pates MD Signature Date/Time: 09/08/2021/2:47:48 PM    Final  MONITORS  LONG TERM MONITOR (3-14 DAYS) 12/29/2020  Narrative  Atrial fibrillation with controlled Ventricular response. Atrial fibrillation burden 100%. The average heart rate was 87bmm and ranged from 50 to 147bpm.  Rare PVC noted.   Patch Wear Time:  3 days and 2 hours (2022-12-03T21:05:49-0500 to 2022-12-06T23:54:09-498)  Atrial Fibrillation occurred continuously (100% burden), ranging from 50-147 bpm (avg of 87 bpm). Isolated VEs were rare (<1.0%, 72), and no VE Couplets or VE Triplets were present.       ______________________________________________________________________________________________      Risk Assessment/Calculations:    CHA2DS2-VASc Score = 4   This indicates a 4.8% annual risk of stroke. The patient's score is based upon: CHF History: 0 HTN History: 1 Diabetes History: 1 Stroke History: 0 Vascular Disease History: 1 Age Score: 1 Gender Score: 0  Physical Exam:   VS:  BP 119/79 (BP Location: Right Arm, Patient Position: Sitting, Cuff Size: Large)   Pulse (!) 110   Ht 5\' 9"  (1.753 m)   Wt (!) 410 lb (186 kg)   SpO2 95%   BMI 60.55 kg/m    Wt Readings from Last 3 Encounters:  03/07/23 (!) 410 lb (186 kg)  09/08/21 (!) 436 lb 1.1 oz (197.8 kg)  08/31/21 (!) 436 lb (197.8 kg)    GEN: Well nourished, well developed in no acute distress NECK: No JVD; No carotid bruits CARDIAC: Irregularly irregular, no  murmurs, rubs, gallops RESPIRATORY:  Clear to auscultation without rales, wheezing or rhonchi  ABDOMEN: Soft, non-tender, non-distended EXTREMITIES:  No edema; No deformity   ASSESSMENT AND PLAN: .    Preoperative clearance: Upcoming colonoscopy procedure.  The patient denies any recent chest pain or shortness of breath.  He is at acceptable risk to proceed with upcoming procedure.  He will need to hold Eliquis for 2 days prior to the procedure and restart as soon as possible afterward at his GI physician's discretion based on bleeding risk.  Aortic stenosis: Patient has a history of moderate aortic stenosis seen on previous echocardiogram in 2023.  Plan for repeat echocardiogram.  This does not need to be done prior to colonoscopy procedure  Permanent atrial fibrillation: Heart rate controlled on metoprolol succinate.  On Eliquis.  Eliquis will need to be held prior to the upcoming colonoscopy  Hypertension: Blood pressure well-controlled  OSA: On CPAP therapy  Morbid obesity: BMI 60.55.  Essentially wheelchair-bound due to inability to walk long distance.  Weight loss imperative.       Dispo: Follow-up with Dr. Mayford Knife in 6 months  Signed, Azalee Course, [place]

## 2023-03-07 NOTE — Progress Notes (Signed)
 Attempted to obtain medical history for pre op call via telephone, unable to reach at this time. HIPAA compliant voicemail message left requesting return call to pre surgical testing department.

## 2023-03-07 NOTE — Patient Instructions (Signed)
 Medication Instructions:  NO CHANGES *If you need a refill on your cardiac medications before your next appointment, please call your pharmacy*   Lab Work: NO LABS If you have labs (blood work) drawn today and your tests are completely normal, you will receive your results only by: MyChart Message (if you have MyChart) OR A paper copy in the mail If you have any lab test that is abnormal or we need to change your treatment, we will call you to review the results.   Testing/Procedures:[address]- WITHIN NEXT 3 MONTHS Your physician has requested that you have an echocardiogram. Echocardiography is a painless test that uses sound waves to create images of your heart. It provides your doctor with information about the size and shape of your heart and how well your heart's chambers and valves are working. This procedure takes approximately one hour. There are no restrictions for this procedure. Please do NOT wear cologne, perfume, aftershave, or lotions (deodorant is allowed). Please arrive 15 minutes prior to your appointment time.  Please note: We ask at that you not bring children with you during ultrasound (echo/ vascular) testing. Due to room size and safety concerns, children are not allowed in the ultrasound rooms during exams. Our front office staff cannot provide observation of children in our lobby area while testing is being conducted. An adult accompanying a patient to their appointment will only be allowed in the ultrasound room at the discretion of the ultrasound technician under special circumstances. We apologize for any inconvenience.    Follow-Up: At [facility], you and your health needs are our priority.  As part of our continuing mission to provide you with exceptional heart care, we have created designated Provider Care Teams.  These Care Teams include your primary Cardiologist (physician) and Advanced Practice Providers (APPs -  Physician Assistants  and Nurse Practitioners) who all work together to provide you with the care you need, when you need it.  Your next appointment:   6 month(s)  Provider:   Armanda Magic, MD   Other Instructions HOLD ELIQUIS 2 DAYS PRIOR TO PROCEDURE AND RESTART IMMEDIATELY AFTERWARDS ONCE CLEARED BY GI

## 2023-03-09 NOTE — Progress Notes (Signed)
 Order(s) created erroneously. Erroneous order ID: [id number]  Order moved by: CHART CORRECTION ANALYST, FIFTEEN  Order move date/time: 03/09/2023 3:43 PM  Source Patient: [id number]  Source Contact: 03/07/2023  Destination Patient: [id number]  Destination Contact: 06/28/2022

## 2023-03-13 ENCOUNTER — Ambulatory Visit (HOSPITAL_COMMUNITY): Payer: Medicare Other | Admitting: Anesthesiology

## 2023-03-13 ENCOUNTER — Encounter (HOSPITAL_COMMUNITY): Payer: Self-pay | Admitting: Internal Medicine

## 2023-03-13 ENCOUNTER — Ambulatory Visit (HOSPITAL_COMMUNITY)
Admission: RE | Admit: 2023-03-13 | Discharge: 2023-03-13 | Disposition: A | Discharge status: Home / Self Care | Payer: Medicare Other | Attending: Internal Medicine | Admitting: Internal Medicine

## 2023-03-13 ENCOUNTER — Other Ambulatory Visit: Payer: Self-pay

## 2023-03-13 ENCOUNTER — Encounter (HOSPITAL_COMMUNITY)
Admission: RE | Disposition: A | Discharge status: Home / Self Care | Payer: Self-pay | Source: Home / Self Care | Attending: Internal Medicine

## 2023-03-13 DIAGNOSIS — Z85038 Personal history of other malignant neoplasm of large intestine: Secondary | ICD-10-CM

## 2023-03-13 DIAGNOSIS — Z7984 Long term (current) use of oral hypoglycemic drugs: Secondary | ICD-10-CM | POA: Insufficient documentation

## 2023-03-13 DIAGNOSIS — Z1211 Encounter for screening for malignant neoplasm of colon: Secondary | ICD-10-CM | POA: Insufficient documentation

## 2023-03-13 DIAGNOSIS — Z7901 Long term (current) use of anticoagulants: Secondary | ICD-10-CM | POA: Diagnosis not present

## 2023-03-13 DIAGNOSIS — E6689 Other obesity not elsewhere classified: Secondary | ICD-10-CM | POA: Insufficient documentation

## 2023-03-13 DIAGNOSIS — Z980 Intestinal bypass and anastomosis status: Secondary | ICD-10-CM | POA: Insufficient documentation

## 2023-03-13 DIAGNOSIS — Z993 Dependence on wheelchair: Secondary | ICD-10-CM | POA: Insufficient documentation

## 2023-03-13 DIAGNOSIS — E119 Type 2 diabetes mellitus without complications: Secondary | ICD-10-CM | POA: Insufficient documentation

## 2023-03-13 DIAGNOSIS — I10 Essential (primary) hypertension: Secondary | ICD-10-CM | POA: Diagnosis not present

## 2023-03-13 DIAGNOSIS — I4891 Unspecified atrial fibrillation: Secondary | ICD-10-CM | POA: Insufficient documentation

## 2023-03-13 DIAGNOSIS — Z6841 Body Mass Index (BMI) 40.0 and over, adult: Secondary | ICD-10-CM | POA: Insufficient documentation

## 2023-03-13 HISTORY — PX: COLONOSCOPY WITH PROPOFOL: SHX5780

## 2023-03-13 LAB — GLUCOSE, CAPILLARY: Glucose-Capillary: 143 mg/dL — ABNORMAL HIGH (ref 70–99)

## 2023-03-13 SURGERY — COLONOSCOPY WITH PROPOFOL
Anesthesia: Monitor Anesthesia Care

## 2023-03-13 MED ORDER — PROPOFOL 1000 MG/100ML IV EMUL
INTRAVENOUS | Status: AC
  Filled 2023-03-13: qty 100

## 2023-03-13 MED ORDER — PROPOFOL 500 MG/50ML IV EMUL
INTRAVENOUS | Status: AC
  Filled 2023-03-13: qty 50

## 2023-03-13 MED ORDER — PROPOFOL 500 MG/50ML IV EMUL
INTRAVENOUS | Status: DC | PRN
  Administered 2023-03-13: 150 ug/kg/min via INTRAVENOUS

## 2023-03-13 MED ORDER — LIDOCAINE 2% (20 MG/ML) 5 ML SYRINGE
INTRAMUSCULAR | Status: DC | PRN
  Administered 2023-03-13: 60 mg via INTRAVENOUS

## 2023-03-13 MED ORDER — SODIUM CHLORIDE 0.9 % IV SOLN: INTRAVENOUS | Status: DC | PRN

## 2023-03-13 MED ORDER — PROPOFOL 10 MG/ML IV BOLUS
INTRAVENOUS | Status: DC | PRN
  Administered 2023-03-13: 20 mg via INTRAVENOUS
  Administered 2023-03-13: 40 mg via INTRAVENOUS

## 2023-03-13 MED ORDER — ONDANSETRON HCL 4 MG/2ML IJ SOLN
INTRAMUSCULAR | Status: DC | PRN
  Administered 2023-03-13: 4 mg via INTRAVENOUS

## 2023-03-13 SURGICAL SUPPLY — 20 items
ELECT REM PT RETURN 9FT ADLT (ELECTROSURGICAL) IMPLANT
ELECTRODE REM PT RTRN 9FT ADLT (ELECTROSURGICAL) IMPLANT
FLOOR PAD 36X40 (MISCELLANEOUS) ×1 IMPLANT
FORCEPS BIOP RAD 4 LRG CAP 4 (CUTTING FORCEPS) IMPLANT
FORCEPS BIOP RJ4 240 W/NDL (CUTTING FORCEPS) IMPLANT
FORCEPS BXJMBJMB 240X2.8X (CUTTING FORCEPS) IMPLANT
INJECTOR/SNARE I SNARE (MISCELLANEOUS) IMPLANT
LUBRICANT JELLY 4.5OZ STERILE (MISCELLANEOUS) IMPLANT
MANIFOLD NEPTUNE II (INSTRUMENTS) IMPLANT
NDL SCLEROTHERAPY 25GX240 (NEEDLE) IMPLANT
NEEDLE SCLEROTHERAPY 25GX240 (NEEDLE) IMPLANT
PAD FLOOR 36X40 (MISCELLANEOUS) ×2 IMPLANT
PROBE APC STR FIRE (PROBE) IMPLANT
PROBE INJECTION GOLD 7FR (MISCELLANEOUS) IMPLANT
SNARE ROTATE MED OVAL 20MM (MISCELLANEOUS) IMPLANT
SYR 50ML LL SCALE MARK (SYRINGE) IMPLANT
TRAP SPECIMEN MUCOUS 40CC (MISCELLANEOUS) IMPLANT
TUBING ENDO SMARTCAP PENTAX (MISCELLANEOUS) IMPLANT
TUBING IRRIGATION ENDOGATOR (MISCELLANEOUS) ×2 IMPLANT
WATER STERILE IRR 1000ML POUR (IV SOLUTION) IMPLANT

## 2023-03-13 NOTE — Discharge Instructions (Signed)
 YOU HAD AN ENDOSCOPIC PROCEDURE TODAY: Refer to the procedure report and other information in the discharge instructions given to you for any specific questions about what was found during the examination. If this information does not answer your questions, please call [facility] office at [phone] to clarify.   YOU SHOULD EXPECT: Some feelings of bloating in the abdomen. Passage of more gas than usual. Walking can help get rid of the air that was put into your GI tract during the procedure and reduce the bloating. If you had a lower endoscopy (such as a colonoscopy or flexible sigmoidoscopy) you may notice spotting of blood in your stool or on the toilet paper. Some abdominal soreness may be present for a day or two, also.  DIET: Your first meal following the procedure should be a light meal and then it is ok to progress to your normal diet. A half-sandwich or bowl of soup is an example of a good first meal. Heavy or fried foods are harder to digest and may make you feel nauseous or bloated. Drink plenty of fluids but you should avoid alcoholic beverages for 24 hours. If you had a esophageal dilation, please see attached instructions for diet.   ACTIVITY: Your care partner should take you home directly after the procedure. You should plan to take it easy, moving slowly for the rest of the day. You can resume normal activity the day after the procedure however YOU SHOULD NOT DRIVE, use power tools, machinery or perform tasks that involve climbing or major physical exertion for 24 hours (because of the sedation medicines used during the test).   SYMPTOMS TO REPORT IMMEDIATELY: A gastroenterologist can be reached at any hour. Please call [phone]  for any of the following symptoms:  . Following lower endoscopy (colonoscopy, flexible sigmoidoscopy) Excessive amounts of blood in the stool  Significant tenderness, worsening of abdominal pains  Swelling of the abdomen that is new, acute  Fever of 100  or higher   FOLLOW UP:  If any biopsies were taken you will be contacted by phone or by letter within the next 1-3 weeks. Call [phone]  if you have not heard about the biopsies in 3 weeks.  Please also call with any specific questions about appointments or follow up tests.

## 2023-03-13 NOTE — Anesthesia Preprocedure Evaluation (Addendum)
 Anesthesia Evaluation  Patient identified by MRN, date of birth, ID band Patient awake    Reviewed: Allergy & Precautions, NPO status , Patient's Chart, lab work & pertinent test results, reviewed documented beta blocker date and time   History of Anesthesia Complications Negative for: history of anesthetic complications  Airway Mallampati: II  TM Distance: >3 FB Neck ROM: Full    Dental  (+) Edentulous Upper, Edentulous Lower   Pulmonary sleep apnea and Continuous Positive Airway Pressure Ventilation    Pulmonary exam normal        Cardiovascular hypertension, Pt. on medications and Pt. on home beta blockers Normal cardiovascular exam+ dysrhythmias Atrial Fibrillation + Valvular Problems/Murmurs AS    '23 TEE - EF 55 to 60%. Left atrial size was dilated. Trivial mitral valve regurgitation. 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 The noncoronary cusp is really the only mobile cusp. Peak and mean gradients through the valve are 21 and 14 mm HG respectively (obtained from gastric view) The AV planimeters to 1.11 cm2. . Aortic valve regurgitation is mild.      Neuro/Psych  PSYCHIATRIC DISORDERS  Depression     Wheelchair bound d/t body habitus     GI/Hepatic negative GI ROS, Neg liver ROS,,,  Endo/Other  diabetes, Type 2, Oral Hypoglycemic AgentsHypothyroidism  Class 4 obesity  Renal/GU negative Renal ROS     Musculoskeletal  (+) Arthritis ,    Abdominal   Peds  Hematology  On eliquis    Anesthesia Other Findings On GLP-1a   Reproductive/Obstetrics                             Anesthesia Physical Anesthesia Plan  ASA: 4  Anesthesia Plan: MAC   Post-op Pain Management: Minimal or no pain anticipated   Induction:   PONV Risk Score and Plan: 1 and Propofol infusion and Treatment may vary due to age or medical condition  Airway Management  Planned: Natural Airway and Simple Face Mask  Additional Equipment: None  Intra-op Plan:   Post-operative Plan:   Informed Consent: I have reviewed the patients History and Physical, chart, labs and discussed the procedure including the risks, benefits and alternatives for the proposed anesthesia with the patient or authorized representative who has indicated his/her understanding and acceptance.       Plan Discussed with: CRNA and Anesthesiologist  Anesthesia Plan Comments:        Anesthesia Quick Evaluation

## 2023-03-13 NOTE — Op Note (Signed)
 [facility] Patient Name: Brian Richmond Procedure Date: 03/13/2023 MRN: [id number] Attending MD: Liliane Shi DO, DO, [id number] Date of Birth: 1955-04-29 CSN: [id number] Age: 68 Admit Type: Ambulatory Procedure:                Colonoscopy Indications:              High risk colon cancer surveillance: Personal                            history of colon cancer Providers:                Liliane Shi DO, DO, Suzy Bouchard, RN, Marja Kays, Technician Referring MD:              Medicines:                See the Anesthesia note for documentation of the                            administered medications Complications:            No immediate complications. Estimated Blood Loss:     Estimated blood loss: none. Procedure:                Pre-Anesthesia Assessment:                           - ASA Grade Assessment: IV - A patient with severe                            systemic disease that is a constant threat to life.                           - The risks and benefits of the procedure and the                            sedation options and risks were discussed with the                            patient. All questions were answered and informed                            consent was obtained.                           After obtaining informed consent, the colonoscope                            was passed under direct vision. Throughout the                            procedure, the patient's blood pressure, pulse, and                            oxygen saturations  were monitored continuously. The                            PCF-HQ190L ([id number]) Olympus colonoscope was                            introduced through the anus with the intention of                            advancing to the ileum. The scope was advanced to                            the descending colon before the procedure was                            aborted. Medications were  given. The colonoscopy                            was somewhat difficult due to poor bowel prep with                            stool present. The patient tolerated the procedure                            well. The quality of the bowel preparation was poor. Scope In: 10:44:36 AM Scope Out: 11:01:36 AM Total Procedure Duration: 0 hours 17 minutes 0 seconds  Findings:      The perianal and digital rectal examinations were normal.      There was evidence of a prior end-to-end colo-colonic anastomosis in the       sigmoid colon and in the distal descending colon. This was patent.      A single small-mouthed diverticulum was found in the descending colon.      Extensive amounts of copious quantities of liquid semi-liquid semi-solid       solid stool was found in the rectum, in the recto-sigmoid colon, in the       sigmoid colon, in the descending colon and in the distal descending       colon, precluding visualization. Lavage of the area was performed using       a moderate amount, resulting in incomplete clearance with continued poor       visualization. Impression:               - Preparation of the colon was poor.                           - Patent end-to-end colo-colonic anastomosis.                           - Stool in the rectum, in the recto-sigmoid colon,                            in the sigmoid colon, in the descending colon and  in the distal descending colon.                           - No specimens collected. Moderate Sedation:      See the other procedure note for documentation of moderate sedation with       intraservice time. Recommendation:           - Discharge patient to home.                           - Resume previous diet.                           - Continue present medications.                           - Resume Eliquis (apixaban) at prior dose today.                           - Repeat colonoscopy in 1 year for surveillance.                            - For any repeat colonoscopy attempt, will need to                            be completed in hospital based setting given                            medical comorbidities and will need 2-day bowel                            preparation. Procedure Code(s):        --- Professional ---                           W0981, 53, Colorectal cancer screening; colonoscopy                            on individual at high risk Diagnosis Code(s):        --- Professional ---                           [id number], Personal history of other malignant                            neoplasm of large intestine                           Z98.0, Intestinal bypass and anastomosis status CPT copyright 2022 [employer]. All rights reserved. The codes documented in this report are preliminary and upon coder review may  be revised to meet current compliance requirements. Dr Liliane Shi, DO Liliane Shi DO, DO 03/13/2023 11:10:59 AM Number of Addenda: 0

## 2023-03-13 NOTE — Anesthesia Postprocedure Evaluation (Signed)
 Anesthesia Post Note  Patient: Brian Richmond  Procedure(s) Performed: COLONOSCOPY WITH PROPOFOL     Patient location during evaluation: PACU Anesthesia Type: MAC Level of consciousness: awake and alert Pain management: pain level controlled Vital Signs Assessment: post-procedure vital signs reviewed and stable Respiratory status: spontaneous breathing, nonlabored ventilation and respiratory function stable Cardiovascular status: stable and blood pressure returned to baseline Anesthetic complications: no   No notable events documented.  Last Vitals:  Vitals:   03/13/23 1130 03/13/23 1140  BP: 104/74 103/75  Pulse: 99 (!) 102  Resp: 14 10  Temp:    SpO2: 96% 96%    Last Pain:  Vitals:   03/13/23 1140  TempSrc:   PainSc: 6                  Beryle Lathe

## 2023-03-13 NOTE — Transfer of Care (Signed)
 Immediate Anesthesia Transfer of Care Note  Patient: Brian Richmond  Procedure(s) Performed: COLONOSCOPY WITH PROPOFOL  Patient Location: PACU and Endoscopy Unit  Anesthesia Type:MAC  Level of Consciousness: drowsy  Airway & Oxygen Therapy: Patient Spontanous Breathing and Patient connected to face mask oxygen  Post-op Assessment: Report given to RN and Post -op Vital signs reviewed and stable  Post vital signs: Reviewed and stable  Last Vitals:  Vitals Value Taken Time  BP    Temp    Pulse    Resp    SpO2      Last Pain:  Vitals:   03/13/23 0915  TempSrc: Temporal  PainSc: 6          Complications: No notable events documented.

## 2023-03-13 NOTE — H&P (Signed)
 [facility]  Subjective: Brian Richmond is a 68 y.o. male who presents for colonoscopy.  He has personal history of colon cancer in 2009 status post sigmoid colon surgery in 2010.  Flexible sigmoidoscopy 08/26/2016 for rectal hemorrhage (Dr. Levora Angel) showed small internal hemorrhoids, moderate amount of semisolid stool in rectum, rectosigmoid and sigmoid colon, no evidence of bleeding at 40 cm.  He was previously seen in office for abdominal pain, sent to general surgery for consideration of cholecystectomy which was ultimately not pursued.  His abdominal pain has resolved, colonoscopy today for follow-up given personal history of colon cancer.  Tolerated bowel preparation.  Last use of Eliquis was 2 days ago.  No chest pain or shortness of breath.  Objective: General: Awake and alert, non-toxic in appearance, obese Cardio: Tachycardic rate and regular rhythm  Pulm: Clear to auscultation, no conversational dyspnea Abdomen: Soft, non-tender to palpation, bowel sounds appreciated    Assessment:  Personal history colon cancer  Plan:  -Recommend colonoscopy, discussed benefits, alternatives and risks of procedure with patient including bleeding/infection/perforation/missed lesion/anesthesia, he verbalized understanding and elected to proceed -Further recommendations to follow pending procedure  Liliane Shi, DO [facility]

## 2023-03-30 ENCOUNTER — Ambulatory Visit (HOSPITAL_COMMUNITY): Payer: Medicare Other | Attending: Cardiovascular Disease

## 2023-03-30 DIAGNOSIS — I350 Nonrheumatic aortic (valve) stenosis: Secondary | ICD-10-CM | POA: Insufficient documentation

## 2023-03-30 LAB — ECHOCARDIOGRAM COMPLETE
AR max vel: 1.26 cm2
AV Area VTI: 1.36 cm2
AV Area mean vel: 1.3 cm2
AV Mean grad: 22.2 mmHg
AV Peak grad: 37.9 mmHg
Ao pk vel: 3.08 m/s
Area-P 1/2: 4.11 cm2
P 1/2 time: 318 ms
S' Lateral: 3.4 cm

## 2023-03-30 MED ORDER — PERFLUTREN LIPID MICROSPHERE
5.0000 mL | INTRAVENOUS | Status: AC | PRN
  Administered 2023-03-30: 5 mL via INTRAVENOUS

## 2023-09-25 ENCOUNTER — Encounter: Payer: Self-pay | Admitting: Dermatology

## 2023-09-25 ENCOUNTER — Ambulatory Visit (INDEPENDENT_AMBULATORY_CARE_PROVIDER_SITE_OTHER): Admitting: Dermatology

## 2023-09-25 VITALS — BP 114/76

## 2023-09-25 DIAGNOSIS — I872 Venous insufficiency (chronic) (peripheral): Secondary | ICD-10-CM | POA: Diagnosis not present

## 2023-09-25 DIAGNOSIS — L821 Other seborrheic keratosis: Secondary | ICD-10-CM | POA: Diagnosis not present

## 2023-09-25 DIAGNOSIS — D485 Neoplasm of uncertain behavior of skin: Secondary | ICD-10-CM

## 2023-09-25 DIAGNOSIS — L304 Erythema intertrigo: Secondary | ICD-10-CM | POA: Diagnosis not present

## 2023-09-25 MED ORDER — NYSTATIN 100000 UNIT/GM EX POWD: 1.0000 | Freq: Every day | CUTANEOUS | 11 refills | Status: AC

## 2023-09-25 MED ORDER — TRIAMCINOLONE ACETONIDE 0.1 % EX CREA: 1.0000 | TOPICAL_CREAM | Freq: Two times a day (BID) | CUTANEOUS | 1 refills | Status: AC

## 2023-09-25 NOTE — Progress Notes (Signed)
   New Patient Visit   Subjective  Brian Richmond is a 68 y.o. male who presents for the following: Spot on scalp x years. It has gotten thicker in the last year. - no history of skin cancer, no family history of melanoma. Not previously treated.   Also mentions rash in groin, comes and goes, itchy and red at times.   The following portions of the chart were reviewed this encounter and updated as appropriate: medications, allergies, medical history  Review of Systems:  No other skin or systemic complaints except as noted in HPI or Assessment and Plan.  Objective  Well appearing patient in no apparent distress; mood and affect are within normal limits.  A focused examination was performed of the following areas: Face Abdomen Lower Legs  Relevant exam findings are noted in the Assessment and Plan.  Left scalp 2.0 cm hyperkeratotic plaque   Assessment & Plan   INTERTRIGO- flared Exam: Erythematous macerated patches in body folds  Intertrigo is a chronic recurrent rash that occurs in skin fold areas that may be associated with friction; heat; moisture; yeast; fungus; and bacteria.  It is exacerbated by increased movement / activity; sweating; and higher atmospheric temperature.  Use of an absorbant powder such as Zeasorb AF powder or other OTC antifungal powder to the area daily can prevent rash recurrence. Other options to help keep the area dry include blow drying the area after bathing or using antiperspirant products such as Duradry sweat minimizing gel.  Treatment Plan: Nystatin  powder daily to affected areas  STASIS DERMATITIS-flared Exam: Erythematous, scaly patches involving the ankle and distal lower leg with associated lower leg edema.   Stasis in the legs causes chronic leg swelling, which may result in itchy or painful rashes, skin discoloration, skin texture changes, and sometimes ulceration.  Recommend daily graduated compression hose/stockings- easiest to put on  first thing in morning, remove at bedtime.  Elevate legs as much as possible. Avoid salt/sodium rich foods.  Treatment Plan: TMC 0.1% cream twice daily to lower legs until clear  NEOPLASM OF UNCERTAIN BEHAVIOR OF SKIN Left scalp Epidermal / dermal shaving  Lesion diameter (cm):  2 Informed consent: discussed and consent obtained   Timeout: patient name, date of birth, surgical site, and procedure verified   Procedure prep:  Patient was prepped and draped in usual sterile fashion Prep type:  Isopropyl alcohol  Anesthesia: the lesion was anesthetized in a standard fashion   Anesthetic:  1% lidocaine  w/ epinephrine  1-100,000 buffered w/ 8.4% NaHCO3 Instrument used: flexible razor blade   Hemostasis achieved with: pressure, aluminum chloride and electrodesiccation   Outcome: patient tolerated procedure well   Post-procedure details: sterile dressing applied and wound care instructions given   Dressing type: bandage and petrolatum    Specimen 1 - Surgical pathology Differential Diagnosis: SK vs SCC vs EPD  Check Margins: No  Return if symptoms worsen or fail to improve.  I, Roseline Hutchinson, CMA, am acting as scribe for Brian CHRISTELLA HOLY, MD .   Documentation: I have reviewed the above documentation for accuracy and completeness, and I agree with the above.  Brian CHRISTELLA HOLY, MD

## 2023-09-25 NOTE — Patient Instructions (Signed)

## 2023-09-26 LAB — SURGICAL PATHOLOGY

## 2023-09-27 ENCOUNTER — Ambulatory Visit: Payer: Self-pay | Admitting: Dermatology
